# Patient Record
Sex: Female | Born: 1950 | Race: White | Hispanic: No | Marital: Married | Smoking: Never smoker
Health system: Southern US, Community
[De-identification: ages and names within clinical notes are randomized; demographics above are authoritative.]

## PROBLEM LIST (undated history)

## (undated) ENCOUNTER — Emergency Department (HOSPITAL_COMMUNITY): Admission: EM | Disposition: A | Discharge status: Other Acute Inpatient Hospital | Payer: Self-pay

## (undated) DIAGNOSIS — Z973 Presence of spectacles and contact lenses: Secondary | ICD-10-CM

## (undated) DIAGNOSIS — N6092 Unspecified benign mammary dysplasia of left breast: Secondary | ICD-10-CM

## (undated) DIAGNOSIS — K631 Perforation of intestine (nontraumatic): Secondary | ICD-10-CM

## (undated) DIAGNOSIS — N952 Postmenopausal atrophic vaginitis: Secondary | ICD-10-CM

## (undated) DIAGNOSIS — E041 Nontoxic single thyroid nodule: Secondary | ICD-10-CM

## (undated) DIAGNOSIS — N840 Polyp of corpus uteri: Secondary | ICD-10-CM

## (undated) DIAGNOSIS — M810 Age-related osteoporosis without current pathological fracture: Secondary | ICD-10-CM

## (undated) HISTORY — PX: COLONOSCOPY: SHX174

## (undated) HISTORY — PX: UPPER GI ENDOSCOPY: SHX6162

## (undated) HISTORY — DX: Unspecified benign mammary dysplasia of left breast: N60.92

## (undated) HISTORY — DX: Postmenopausal atrophic vaginitis: N95.2

## (undated) HISTORY — DX: Age-related osteoporosis without current pathological fracture: M81.0

## (undated) HISTORY — PX: DILATION AND CURETTAGE OF UTERUS: SHX78

## (undated) HISTORY — DX: Nontoxic single thyroid nodule: E04.1

---

## 2001-11-10 ENCOUNTER — Other Ambulatory Visit: Admission: RE | Admit: 2001-11-10 | Discharge: 2001-11-10 | Payer: Self-pay | Admitting: Family Medicine

## 2001-12-24 ENCOUNTER — Ambulatory Visit (HOSPITAL_COMMUNITY): Admission: RE | Admit: 2001-12-24 | Discharge: 2001-12-24 | Payer: Self-pay | Admitting: Gastroenterology

## 2003-01-05 ENCOUNTER — Other Ambulatory Visit: Admission: RE | Admit: 2003-01-05 | Discharge: 2003-01-05 | Payer: Self-pay | Admitting: Family Medicine

## 2003-01-19 ENCOUNTER — Emergency Department (HOSPITAL_COMMUNITY): Admission: EM | Admit: 2003-01-19 | Discharge: 2003-01-19 | Payer: Self-pay

## 2004-03-09 ENCOUNTER — Other Ambulatory Visit: Admission: RE | Admit: 2004-03-09 | Discharge: 2004-03-09 | Payer: Self-pay | Admitting: Family Medicine

## 2005-06-15 ENCOUNTER — Other Ambulatory Visit: Admission: RE | Admit: 2005-06-15 | Discharge: 2005-06-15 | Payer: Self-pay | Admitting: Family Medicine

## 2007-09-10 DIAGNOSIS — E041 Nontoxic single thyroid nodule: Secondary | ICD-10-CM

## 2007-09-10 HISTORY — DX: Nontoxic single thyroid nodule: E04.1

## 2007-09-29 ENCOUNTER — Encounter: Admission: RE | Admit: 2007-09-29 | Discharge: 2007-09-29 | Payer: Self-pay | Admitting: Family Medicine

## 2008-04-28 ENCOUNTER — Other Ambulatory Visit: Admission: RE | Admit: 2008-04-28 | Discharge: 2008-04-28 | Payer: Self-pay | Admitting: Obstetrics and Gynecology

## 2008-05-31 ENCOUNTER — Ambulatory Visit (HOSPITAL_COMMUNITY): Admission: RE | Admit: 2008-05-31 | Discharge: 2008-05-31 | Payer: Self-pay | Admitting: Obstetrics & Gynecology

## 2008-05-31 ENCOUNTER — Encounter: Payer: Self-pay | Admitting: Obstetrics & Gynecology

## 2008-05-31 HISTORY — PX: COMBINED HYSTEROSCOPY DIAGNOSTIC / D&C: SUR297

## 2009-09-26 ENCOUNTER — Encounter: Admission: RE | Admit: 2009-09-26 | Discharge: 2009-09-26 | Payer: Self-pay | Admitting: Obstetrics & Gynecology

## 2009-10-13 ENCOUNTER — Encounter: Admission: RE | Admit: 2009-10-13 | Discharge: 2009-10-13 | Payer: Self-pay | Admitting: Endocrinology

## 2009-10-13 ENCOUNTER — Other Ambulatory Visit: Admission: RE | Admit: 2009-10-13 | Discharge: 2009-10-13 | Payer: Self-pay | Admitting: Interventional Radiology

## 2010-10-24 NOTE — Op Note (Signed)
[name], Brianna Fuller             ACCOUNT NO.:  [id number]   MEDICAL RECORD NO.:  [id number]          PATIENT TYPE:  AMB   LOCATION:  SDC                           FACILITY:  [facility]   PHYSICIAN:  M. Leda Quail, MD  DATE OF BIRTH:  [birth year]   DATE OF PROCEDURE:  05/31/2008  DATE OF DISCHARGE:                               OPERATIVE REPORT   PREOPERATIVE DIAGNOSES:  38. A 60 year old G0 thin white female with postmenopausal bleeding.  2. Sonohysterogram in the office showed a 6-mm fundal polyp.  3. Several small uterine fibroids.   POSTOPERATIVE DIAGNOSIS:  Thin atrophic endometrium without any polyp.   PROCEDURES:  1. Hysteroscopy.  2. Dilation and curettage.   SURGEON:  M. Leda Quail, MD   ASSISTANT:  OR staff.   ANESTHESIA:  LMA.   FINDINGS:  Thin endometrium.  She has a small change in the contour of  the uterus.  It is consistent with a fibroid that abuts the level of the  endometrium, but it really has no submucous component.   SPECIMENS:  Endometrial curettings.   DISPOSITION:  Specimens sent to Pathology.   ESTIMATED BLOOD LOSS:  Minimal.   FLUIDS:  1000 mL of LR.   URINE OUTPUT:  100 mL of clear urine output.   FLUID DEFICIT:  A 3% sorbitol used for the procedure with a 55 mL fluid  deficit.   COMPLICATIONS:  None.   INDICATIONS:  [name] is a 60 year old G0 single white female who  has a history of postmenopausal bleeding.  She has been on Prempro for 3-  5 years.  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  She has had no bleeding  until that time with menopause at age 7.  She was evaluated with  ultrasound sonohysterogram that showed the uterus measuring 7.7 x 3.3 x  5.9 cm with a thin endometrium.  She had multiple fibroids.  Sonohysterogram showed a possible fundal polyp measuring 6 x 4 x 6 mm on  the left side of the fundus.  Her endometrial stripe was slightly  enhanced and was measuring 1.7 mm.  At this point because of the polyp,  I  recommended hysteroscopy with polyp removal.  Risks and benefits were  explained to the patient.  She did watch video in the office.  This was  all documented in her office chart.  All questions were answered.  She  was scheduled for surgery.   DESCRIPTION OF PROCEDURE:  The patient was taken to the operating room.  She was placed in supine position.  Anesthesia was administered by the  Anesthesia staff without difficulty.  Legs were positioned in a high  lithotomy position in [place] stirrups.  SCDs were in lower extremities  bilaterally.  A single dose of IV antibiotics have been given.  Perineum, inner thighs, and vagina were prepped and draped in normal  sterile fashion.  A red rubber Foley catheter was used to drain the  bladder of all urine.  A bivalve speculum was placed in the vagina.  The  anterior lip of the cervix was grasped with a single-tooth tenaculum.  A  paracervical block was placed in the cervix.  A 10 mL of 1% lidocaine  mixed 1:1 with epinephrine (1:100,000 units) was instilled in cervix.  The uterus sounded to about 7 cm, which was larger then the ultrasound  showed.  Using [facility] dilators, the cervix was dilated up to a #21.  Hysteroscope was then passed into the cervical canal to the uterine  fundus, but the hysteroscope was placed.  I could easily tell that the  dilators had pushed into the myometrium of the fundus.  However, there  was no perforation that was noted.  Tubal ostia were noted bilaterally.  The endometrium was quite thin.  There was no atypical fluid loss at  this time.  In the left fundus, there was some evidence of a small bulge  at the top of the fundus that could be consistent with the fibroid.  If  this was a fibroid, had very minimal submucous component and because of  the dilation in the myometrium, I did not feel resection at this point  was appropriate.  A #1 rough curette was obtained and endometrium was  curetted until a rough gritty texture  was noted.  There was scant tissue  specimen that was obtained.  This was sent to Pathology.   At this point, the procedure was ended.  All instruments were removed  from the cervix.  The tenaculum was removed.  There was scant bleeding  from the cervix.  A 30 mg IV of Toradol was given to the patient.  After  all her instruments were removed, the Betadine prep was cleaned off from  her skin.  Her legs were positioned back to supine position.  She was  awakened from anesthesia without difficulty and taken to the recovery  room in stable condition.  Sponge, lap, needle, and instrument counts  were correct x2 for the procedure.      Lum Keas, MD  Electronically Signed     MSM/MEDQ  D:  05/31/2008  T:  05/31/2008  Job:  [id number]

## 2010-10-27 NOTE — Procedures (Signed)
[place]  Patient:    Brianna Fuller, Brianna Fuller Visit Number: [id number] MRN: [id number]          Service Type: END Location: ENDO Attending Physician:  Charna Elizabeth Dictated by:   Anselmo Rod, M.D. Proc. Date: 12/24/01 Admit Date:  12/24/2001 Discharge Date: 12/24/2001   CC:         Talmadge Coventry, M.D.   Procedure Report  DATE OF BIRTH:  08-06-2050  PROCEDURE PERFORMED:  Colonoscopy.  ENDOSCOPIST:  Anselmo Rod, M.D.  INSTRUMENT USED:  Olympus video colonoscope (adjustable pediatric scope).  INDICATIONS FOR PROCEDURE:  A 60 year old white female undergoing screening colonoscopy.  The patient has a family history of breast cancer, and the patient was also found to have guaiac-positive stools in the recent past.  PREPROCEDURE PREPARATION:  Informed consent was procured from the patient. The patient was fasted for eight hours prior to the procedure and prepped with a bottle of magnesium citrate and a gallon of NuLYTELY the night prior to the procedure.  PREPROCEDURE PHYSICAL EXAMINATION:  VITAL SIGNS:  Stable.  NECK:  Supple.  CHEST:  Clear to auscultation.  HEART:  S1 and S2 regular.  ABDOMEN:  Soft with normal bowel sounds.  DESCRIPTION OF PROCEDURE:  The patient was placed in the left lateral decubitus position and sedated with 80 mg of Demerol and 8 mg of Versed intravenously.  Once the patient was adequately sedated and maintained on low flow oxygen and continuous cardiac monitoring, the Olympus video colonoscope was advanced from the rectum to the cecum with slight difficulty.  The patient had a very tortuous colon.  Small non-bleeding internal hemorrhoids were seen on retroflexion in the rectum.  No masses, polyps, erosions, ulcerations, or diverticula were present.  IMPRESSION: 1. Tortuous colon. 2. No masses, polyps, or diverticula seen. 3. Small non-bleeding internal hemorrhoids.  RECOMMENDATIONS: 1. A high  fiber diet has been recommended for the patient. 2. Repeat colorectal examination screening is recommended in the next five    years unless the patient develops any abnormal symptoms in the interim. 3. Repeat guaiac testing will be done on an outpatient basis, and further    recommendations will be made as needed. Dictated by:   Anselmo Rod, M.D. Attending Physician:  Charna Elizabeth DD:  12/24/01 TD:  12/29/01 Job: [id number]

## 2011-03-16 LAB — URINALYSIS, ROUTINE W REFLEX MICROSCOPIC
Bilirubin Urine: NEGATIVE
Glucose, UA: NEGATIVE mg/dL
Hgb urine dipstick: NEGATIVE
Ketones, ur: NEGATIVE mg/dL
pH: 6 (ref 5.0–8.0)

## 2011-03-16 LAB — CBC
HCT: 34.8 % — ABNORMAL LOW (ref 36.0–46.0)
Hemoglobin: 11.9 g/dL — ABNORMAL LOW (ref 12.0–15.0)
MCHC: 34.1 g/dL (ref 30.0–36.0)
MCV: 92.5 fL (ref 78.0–100.0)
RDW: 12.5 % (ref 11.5–15.5)

## 2011-09-22 ENCOUNTER — Encounter (HOSPITAL_COMMUNITY): Payer: Self-pay | Admitting: Family Medicine

## 2011-09-22 ENCOUNTER — Emergency Department (HOSPITAL_COMMUNITY)
Admission: EM | Admit: 2011-09-22 | Discharge: 2011-09-22 | Disposition: A | Discharge status: Home / Self Care | Payer: BC Managed Care – PPO | Attending: Emergency Medicine | Admitting: Emergency Medicine

## 2011-09-22 DIAGNOSIS — W5501XA Bitten by cat, initial encounter: Secondary | ICD-10-CM

## 2011-09-22 DIAGNOSIS — Y92009 Unspecified place in unspecified non-institutional (private) residence as the place of occurrence of the external cause: Secondary | ICD-10-CM | POA: Insufficient documentation

## 2011-09-22 DIAGNOSIS — IMO0001 Reserved for inherently not codable concepts without codable children: Secondary | ICD-10-CM | POA: Insufficient documentation

## 2011-09-22 DIAGNOSIS — S61509A Unspecified open wound of unspecified wrist, initial encounter: Secondary | ICD-10-CM | POA: Insufficient documentation

## 2011-09-22 DIAGNOSIS — Z880 Allergy status to penicillin: Secondary | ICD-10-CM | POA: Insufficient documentation

## 2011-09-22 MED ORDER — CLINDAMYCIN HCL 150 MG PO CAPS: 150.0000 mg | ORAL_CAPSULE | Freq: Four times a day (QID) | ORAL | Status: AC

## 2011-09-22 NOTE — ED Provider Notes (Signed)
History     CSN: [id number]  Arrival date & time 09/22/11  1733   First MD Initiated Contact with Patient 09/22/11 1844      Chief Complaint  Patient presents with  . Animal Bite    (Consider location/radiation/quality/duration/timing/severity/associated sxs/prior treatment) Patient is a 61 y.o. female presenting with animal bite. The history is provided by the patient.  Animal Bite  The incident occurred today. Pertinent negatives include no numbness and no weakness.   patient was bit by a cat earlier today. It is a neighbors cat that she is watching. She called the neighbor and the neighbor stated that he doesn't have a fat. The calves not had his shots. The cat is an indoor cat and does not been exposed to any wild animals. Tetanus is up-to-date. She still has a cat.  History reviewed. No pertinent past medical history.  History reviewed. No pertinent past surgical history.  History reviewed. No pertinent family history.  History  Substance Use Topics  . Smoking status: Never Smoker   . Smokeless tobacco: Not on file  . Alcohol Use:     OB History    Grav Para Term Preterm Abortions TAB SAB Ect Mult Living                  Review of Systems  Skin: Positive for wound. Negative for color change, pallor and rash.  Neurological: Negative for weakness and numbness.    Allergies  Amoxicillin and Other  Home Medications   Current Outpatient Rx  Name Route Sig Dispense Refill  . CALCIUM + D PO Oral Take 2 tablets by mouth.    Marland Kitchen CONJ ESTROG-MEDROXYPROGEST ACE 0.3-1.5 MG PO TABS Oral Take 1 tablet by mouth daily.    . OMEGA-3 FATTY ACIDS 1000 MG PO CAPS Oral Take 2 g by mouth daily.    Marland Kitchen LORATADINE 10 MG PO TABS Oral Take 10 mg by mouth daily. For allergies    . ADULT MULTIVITAMIN W/MINERALS CH Oral Take 2 tablets by mouth daily.    Marland Kitchen OVER THE COUNTER MEDICATION Oral Take 2 tablets by mouth daily.    Marland Kitchen CLINDAMYCIN HCL 150 MG PO CAPS Oral Take 1 capsule (150 mg total)  by mouth every 6 (six) hours. 20 capsule 0    BP 112/84  Pulse 70  Temp(Src) 98.1 F (36.7 C) (Oral)  Resp 20  SpO2 100%  Physical Exam  Constitutional: She appears well-developed and well-nourished.  Musculoskeletal: Normal range of motion.       2 small puncture wounds to dorsum of left wrist. No bleeding. Neurovascular intact distally.  Skin:       Puncture wounds to left wrist    ED Course  Procedures (including critical care time)  Labs Reviewed - No data to display No results found.   1. Cat bite       MDM  Cat bite to left wrist. Doubt intra-articular involvement. Patient has a Still. Tetanus is up-to-date. Antibiotics will be given. Health Department will need to follow        Juliet Rude. Rubin Payor, MD 09/22/11 1924

## 2011-09-22 NOTE — Discharge Instructions (Signed)
Animal Bite  An animal bite can result in a scratch on the skin, deep open cut, puncture of the skin, crush injury, or tearing away of the skin or a body part. Dogs are responsible for most animal bites. Children are bitten more often than adults. An animal bite can range from very mild to more serious. A small bite from your house pet is no cause for alarm. However, some animal bites can become infected or injure a bone or other tissue. You must seek medical care if:  · The skin is broken and bleeding does not slow down or stop after 15 minutes.  · The puncture is deep and difficult to clean (such as a cat bite).  · Pain, warmth, redness, or pus develops around the wound.  · The bite is from a stray animal or rodent. There may be a risk of rabies infection.  · The bite is from a snake, raccoon, skunk, fox, coyote, or bat. There may be a risk of rabies infection.  · The person bitten has a chronic illness such as diabetes, liver disease, or cancer, or the person takes medicine that lowers the immune system.  · There is concern about the location and severity of the bite.  It is important to clean and protect an animal bite wound right away to prevent infection. Follow these steps:  · Clean the wound with plenty of water and soap.  · Apply an antibiotic cream.  · Apply gentle pressure over the wound with a clean towel or gauze to slow or stop bleeding.  · Elevate the affected area above the heart to help stop any bleeding.  · Seek medical care. Getting medical care within 8 hours of the animal bite leads to the best possible outcome.  DIAGNOSIS   Your caregiver will most likely:  · Take a detailed history of the animal and the bite injury.  · Perform a wound exam.  · Take your medical history.  Blood tests or X-rays may be performed. Sometimes, infected bite wounds are cultured and sent to a lab to identify the infectious bacteria.   TREATMENT   Medical treatment will depend on the location and type of animal bite as  well as the patient's medical history. Treatment may include:  · Wound care, such as cleaning and flushing the wound with saline solution, bandaging, and elevating the affected area.  · Antibiotics.  · Tetanus immunization.  · Rabies immunization.  · Leaving the wound open to heal. This is often done with animal bites, due to the high risk of infection. However, in certain cases, wound closure with stitches, wound adhesive, skin adhesive strips, or staples may be used.   Infected bites that are left untreated may require intravenous (IV) antibiotics and surgical treatment in the hospital.  HOME CARE INSTRUCTIONS  · Follow your caregiver's instructions for wound care.  · Take all medicines as directed.  · If your caregiver prescribes antibiotics, take them as directed. Finish them even if you start to feel better.  · Follow up with your caregiver for further exams or immunizations as directed.  You may need a tetanus shot if:  · You cannot remember when you had your last tetanus shot.  · You have never had a tetanus shot.  · The injury broke your skin.  If you get a tetanus shot, your arm may swell, get red, and feel warm to the touch. This is common and not a problem. If you need a tetanus   shot and you choose not to have one, there is a rare chance of getting tetanus. Sickness from tetanus can be serious.  SEEK MEDICAL CARE IF:  · You notice warmth, redness, soreness, swelling, pus discharge, or a bad smell coming from the wound.  · You have a red line on the skin coming from the wound.  · You have a fever, chills, or a general ill feeling.  · You have nausea or vomiting.  · You have continued or worsening pain.  · You have trouble moving the injured part.  · You have other questions or concerns.  MAKE SURE YOU:  · Understand these instructions.  · Will watch your condition.  · Will get help right away if you are not doing well or get worse.  Document Released: 02/13/2011 Document Revised: 05/17/2011 Document  Reviewed: 02/13/2011  [employer]® Patient Information ©2012 [employer].

## 2011-09-22 NOTE — ED Notes (Signed)
Pt sts neighbors cat bit her when she was feeding them. Pt has teeth marks on hand. sts unsure if cat has had rabies vaccination but cats are indoor cats.

## 2012-12-09 ENCOUNTER — Encounter: Payer: Self-pay | Admitting: Certified Nurse Midwife

## 2012-12-16 ENCOUNTER — Encounter: Payer: Self-pay | Admitting: Certified Nurse Midwife

## 2012-12-16 ENCOUNTER — Ambulatory Visit (INDEPENDENT_AMBULATORY_CARE_PROVIDER_SITE_OTHER): Payer: BC Managed Care – PPO | Admitting: Certified Nurse Midwife

## 2012-12-16 VITALS — BP 94/60 | HR 64 | Resp 16 | Ht 64.0 in | Wt 130.0 lb

## 2012-12-16 DIAGNOSIS — N951 Menopausal and female climacteric states: Secondary | ICD-10-CM

## 2012-12-16 DIAGNOSIS — Z01419 Encounter for gynecological examination (general) (routine) without abnormal findings: Secondary | ICD-10-CM

## 2012-12-16 DIAGNOSIS — Z0000 Encounter for general adult medical examination without abnormal findings: Secondary | ICD-10-CM

## 2012-12-16 LAB — CBC
HCT: 38 % (ref 36.0–46.0)
Hemoglobin: 12.5 g/dL (ref 12.0–15.0)
RBC: 4.2 MIL/uL (ref 3.87–5.11)

## 2012-12-16 LAB — POCT URINALYSIS DIPSTICK
Blood, UA: NEGATIVE
Glucose, UA: NEGATIVE
Ketones, UA: NEGATIVE
Protein, UA: NEGATIVE
Urobilinogen, UA: NEGATIVE

## 2012-12-16 LAB — HEMOGLOBIN, FINGERSTICK: Hemoglobin, fingerstick: 13.1 g/dL (ref 12.0–16.0)

## 2012-12-16 MED ORDER — CONJ ESTROG-MEDROXYPROGEST ACE 0.3-1.5 MG PO TABS: 1.0000 | ORAL_TABLET | Freq: Every day | ORAL | Status: DC

## 2012-12-16 NOTE — Progress Notes (Signed)
62 y.o. G0P0000 Single Caucasian Fe here for annual exam.Menopausal on HRT desires continuance. Denies vaginal bleeding or vaginal dryness. Saw PCP this year with all labs, being normal.  No health issues.    Patient's last menstrual period was 06/12/2007.          Sexually active: no  The current method of family planning is post menopausal status.    Exercising: yes  gym, weights & yoga Smoker:  no  Health Maintenance: Pap: 12-12-11 neg HPV HR neg MMG: 12-14-11 normal Colonoscopy:  9/13 BMD:  12-14-11 TDaP: 2013 Labs: Poct urine-neg, Hgb- Self breast exam: not done   reports that she has never smoked. She does not have any smokeless tobacco history on file. She reports that she drinks about 2.0 ounces of alcohol per week. She reports that she does not use illicit drugs.  Past Medical History  Diagnosis Date  . Thyroid nodule 4/09    right  . Osteoporosis     Past Surgical History  Procedure Laterality Date  . Combined hysteroscopy diagnostic / d&c  12/09    Current Outpatient Prescriptions  Medication Sig Dispense Refill  . Ascorbic Acid (VITAMIN C PO) Take 500 mg by mouth as needed.      . Calcium Carbonate-Vitamin D (CALCIUM + D PO) Take 1 tablet by mouth daily.       . Cholecalciferol (VITAMIN D PO) Take 2,000 Int'l Units by mouth. 2 daily      . estrogen, conjugated,-medroxyprogesterone (PREMPRO) 0.3-1.5 MG per tablet Take 1 tablet by mouth daily.      Marland Kitchen loratadine (CLARITIN) 10 MG tablet Take 10 mg by mouth daily. For allergies       No current facility-administered medications for this visit.    Family History  Problem Relation Age of Onset  . Hypertension Father   . Cancer Brother     skin  . Osteoporosis Mother   . Hyperlipidemia Mother     ROS:  Pertinent items are noted in HPI.  Otherwise, a comprehensive ROS was negative.  Exam:   BP 94/60  Pulse 64  Resp 16  Ht 5\' 4"  (1.626 m)  Wt 130 lb (58.968 kg)  BMI 22.3 kg/m2  LMP 06/12/2007 Height: 5\' 4"   (162.6 cm)  Ht Readings from Last 3 Encounters:  12/16/12 5\' 4"  (1.626 m)    General appearance: alert, cooperative and appears stated age Head: Normocephalic, without obvious abnormality, atraumatic Neck: no adenopathy, supple, symmetrical, trachea midline and thyroid normal to inspection and palpation left breast and right lobe slight enlargement with nodule, no size change Lungs: clear to auscultation bilaterally Breasts: normal appearance, no masses or tenderness, No nipple retraction or dimpling, No nipple discharge or bleeding, No axillary or supraclavicular adenopathy, Taught monthly breast self examination Heart: regular rate and rhythm Abdomen: soft, non-tender; no masses,  no organomegaly Extremities: extremities normal, atraumatic, no cyanosis or edema Skin: Skin color, texture, turgor normal. No rashes or lesions Lymph nodes: Cervical, supraclavicular, and axillary nodes normal. No abnormal inguinal nodes palpated Neurologic: Grossly normal   Pelvic: External genitalia:  no lesions              Urethra:  normal appearing urethra with no masses, tenderness or lesions              Bartholin's and Skene's: normal                 Vagina: normal appearing vagina with normal color and discharge, no lesions  Cervix: normal, non tender              Pap taken: no Bimanual Exam:  Uterus:  anteflexed and nodular consistent with fibroid              Adnexa: normal adnexa               Rectovaginal: Confirms               Anus:  normal sphincter tone, no lesions  A:  Well Woman with normal exam  Menopausal on HRT desires continuance  Known thyroid nodule on right with - biopsy history no size change  Osteoporosis defers medication use, managing with exercise, diet and calcium/Vitamin D supplementation    P:   Reviewed health and wellness pertinent to exam  Discussed risks and benefits of HRT and patient/provider feel this is a good choice to continue HRT.  Rx Prempro  see order  Aware of importance to advise if vaginal bleeding  Continue follow up as indicated if change with PCor endocrine  Continue current plan of management, BMD due 2015.  Labs: CBC, Vitamin D   Pap smear as per guidelines   Mammogram yearly  Given SBE card for reminder pap smear not taken today  counseled on breast self exam, mammography screening, menopause, osteoporosis, adequate intake of calcium and vitamin D, diet and exercise  return annually or prn  An After Visit Summary was printed and given to the patient.  Reviewed, TL

## 2012-12-16 NOTE — Patient Instructions (Signed)

## 2012-12-17 LAB — VITAMIN D 25 HYDROXY (VIT D DEFICIENCY, FRACTURES): Vit D, 25-Hydroxy: 53 ng/mL (ref 30–89)

## 2012-12-18 ENCOUNTER — Telehealth: Payer: Self-pay

## 2012-12-18 NOTE — Telephone Encounter (Signed)
Message copied by Eliezer Bottom on Thu Dec 18, 2012 10:18 AM ------      Message from: Verner Chol      Created: Wed Dec 17, 2012  4:45 PM       Notify CBC normal      Vitamin D normal  Can take 1000IU daily to maintain ------

## 2012-12-18 NOTE — Telephone Encounter (Signed)
lmtcb

## 2012-12-19 NOTE — Telephone Encounter (Signed)
Patient notified of results.

## 2013-11-12 ENCOUNTER — Other Ambulatory Visit: Payer: Self-pay | Admitting: Family Medicine

## 2013-11-12 DIAGNOSIS — E041 Nontoxic single thyroid nodule: Secondary | ICD-10-CM

## 2013-11-17 ENCOUNTER — Ambulatory Visit
Admission: RE | Admit: 2013-11-17 | Discharge: 2013-11-17 | Disposition: A | Discharge status: Home / Self Care | Payer: BC Managed Care – PPO | Source: Ambulatory Visit | Attending: Family Medicine | Admitting: Family Medicine

## 2013-11-17 ENCOUNTER — Other Ambulatory Visit: Payer: BC Managed Care – PPO

## 2013-11-17 DIAGNOSIS — E041 Nontoxic single thyroid nodule: Secondary | ICD-10-CM

## 2013-12-17 ENCOUNTER — Ambulatory Visit (INDEPENDENT_AMBULATORY_CARE_PROVIDER_SITE_OTHER): Payer: BC Managed Care – PPO | Admitting: Certified Nurse Midwife

## 2013-12-17 ENCOUNTER — Encounter: Payer: Self-pay | Admitting: Certified Nurse Midwife

## 2013-12-17 VITALS — BP 116/68 | HR 68 | Ht 64.0 in | Wt 128.0 lb

## 2013-12-17 DIAGNOSIS — N951 Menopausal and female climacteric states: Secondary | ICD-10-CM

## 2013-12-17 DIAGNOSIS — Z124 Encounter for screening for malignant neoplasm of cervix: Secondary | ICD-10-CM

## 2013-12-17 DIAGNOSIS — Z01419 Encounter for gynecological examination (general) (routine) without abnormal findings: Secondary | ICD-10-CM

## 2013-12-17 DIAGNOSIS — Z0000 Encounter for general adult medical examination without abnormal findings: Secondary | ICD-10-CM

## 2013-12-17 MED ORDER — CONJ ESTROG-MEDROXYPROGEST ACE 0.3-1.5 MG PO TABS: 1.0000 | ORAL_TABLET | Freq: Every day | ORAL | Status: DC

## 2013-12-17 NOTE — Patient Instructions (Signed)
EXERCISE AND DIET:  We recommended that you start or continue a regular exercise program for good health. Regular exercise means any activity that makes your heart beat faster and makes you sweat.  We recommend exercising at least 30 minutes per day at least 3 days a week, preferably 4 or 5.  We also recommend a diet low in fat and sugar.  Inactivity, poor dietary choices and obesity can cause diabetes, heart attack, stroke, and kidney damage, among others.    ALCOHOL AND SMOKING:  Women should limit their alcohol intake to no more than 7 drinks/beers/glasses of wine (combined, not each!) per week. Moderation of alcohol intake to this level decreases your risk of breast cancer and liver damage. And of course, no recreational drugs are part of a healthy lifestyle.  And absolutely no smoking or even second hand smoke. Most people know smoking can cause heart and lung diseases, but did you know it also contributes to weakening of your bones? Aging of your skin?  Yellowing of your teeth and nails?  CALCIUM AND VITAMIN D:  Adequate intake of calcium and Vitamin D are recommended.  The recommendations for exact amounts of these supplements seem to change often, but generally speaking 600 mg of calcium (either carbonate or citrate) and 800 units of Vitamin D per day seems prudent. Certain women may benefit from higher intake of Vitamin D.  If you are among these women, your doctor will have told you during your visit.    PAP SMEARS:  Pap smears, to check for cervical cancer or precancers,  have traditionally been done yearly, although recent scientific advances have shown that most women can have pap smears less often.  However, every woman still should have a physical exam from her gynecologist every year. It will include a breast check, inspection of the vulva and vagina to check for abnormal growths or skin changes, a visual exam of the cervix, and then an exam to evaluate the size and shape of the uterus and  ovaries.  And after 63 years of age, a rectal exam is indicated to check for rectal cancers. We will also provide age appropriate advice regarding health maintenance, like when you should have certain vaccines, screening for sexually transmitted diseases, bone density testing, colonoscopy, mammograms, etc.   MAMMOGRAMS:  All women over 40 years old should have a yearly mammogram. Many facilities now offer a "3D" mammogram, which may cost around $50 extra out of pocket. If possible,  we recommend you accept the option to have the 3D mammogram performed.  It both reduces the number of women who will be called back for extra views which then turn out to be normal, and it is better than the routine mammogram at detecting truly abnormal areas.    COLONOSCOPY:  Colonoscopy to screen for colon cancer is recommended for all women at age 50.  We know, you hate the idea of the prep.  We agree, BUT, having colon cancer and not knowing it is worse!!  Colon cancer so often starts as a polyp that can be seen and removed at colonscopy, which can quite literally save your life!  And if your first colonoscopy is normal and you have no family history of colon cancer, most women don't have to have it again for 10 years.  Once every ten years, you can do something that may end up saving your life, right?  We will be happy to help you get it scheduled when you are ready.    Be sure to check your insurance coverage so you understand how much it will cost.  It may be covered as a preventative service at no cost, but you should check your particular policy.     Osteoporosis Throughout your life, your body breaks down old bone and replaces it with new bone. As you get older, your body does not replace bone as quickly as it breaks it down. By the age of 30 years, most people begin to gradually lose bone because of the imbalance between bone loss and replacement. Some people lose more bone than others. Bone loss beyond a specified  normal degree is considered osteoporosis.  Osteoporosis affects the strength and durability of your bones. The inside of the ends of your bones and your flat bones, like the bones of your pelvis, look like honeycomb, filled with tiny open spaces. As bone loss occurs, your bones become less dense. This means that the open spaces inside your bones become bigger and the walls between these spaces become thinner. This makes your bones weaker. Bones of a person with osteoporosis can become so weak that they can break (fracture) during minor accidents, such as a simple fall. CAUSES  The following factors have been associated with the development of osteoporosis:  Smoking.  Drinking more than 2 alcoholic drinks several days per week.  Long-term use of certain medicines:  Corticosteroids.  Chemotherapy medicines.  Thyroid medicines.  Antiepileptic medicines.  Gonadal hormone suppression medicine.  Immunosuppression medicine.  Being underweight.  Lack of physical activity.  Lack of exposure to the sun. This can lead to vitamin D deficiency.  Certain medical conditions:  Certain inflammatory bowel diseases, such as Crohn disease and ulcerative colitis.  Diabetes.  Hyperthyroidism.  Hyperparathyroidism. RISK FACTORS Anyone can develop osteoporosis. However, the following factors can increase your risk of developing osteoporosis:  Gender--Women are at higher risk than men.  Age--Being older than 50 years increases your risk.  Ethnicity--White and Asian people have an increased risk.  Weight --Being extremely underweight can increase your risk of osteoporosis.  Family history of osteoporosis--Having a family member who has developed osteoporosis can increase your risk. SYMPTOMS  Usually, people with osteoporosis have no symptoms.  DIAGNOSIS  Signs during a physical exam that may prompt your caregiver to suspect osteoporosis include:  Decreased height. This is usually caused  by the compression of the bones that form your spine (vertebrae) because they have weakened and become fractured.  A curving or rounding of the upper back (kyphosis). To confirm signs of osteoporosis, your caregiver may request a procedure that uses 2 low-dose X-ray beams with different levels of energy to measure your bone mineral density (dual-energy X-ray absorptiometry [DXA]). Also, your caregiver may check your level of vitamin D. TREATMENT  The goal of osteoporosis treatment is to strengthen bones in order to decrease the risk of bone fractures. There are different types of medicines available to help achieve this goal. Some of these medicines work by slowing the processes of bone loss. Some medicines work by increasing bone density. Treatment also involves making sure that your levels of calcium and vitamin D are adequate. PREVENTION  There are things you can do to help prevent osteoporosis. Adequate intake of calcium and vitamin D can help you achieve optimal bone mineral density. Regular exercise can also help, especially resistance and weight-bearing activities. If you smoke, quitting smoking is an important part of osteoporosis prevention. MAKE SURE YOU:  Understand these instructions.  Will watch your condition.    Will get help right away if you are not doing well or get worse. FOR MORE INFORMATION www.osteo.org and www.nof.org Document Released: 03/07/2005 Document Revised: 09/22/2012 Document Reviewed: 05/12/2011 [employer] Patient Information 2015 [employer]. This information is not intended to replace advice given to you by your health care provider. Make sure you discuss any questions you have with your health care provider.  

## 2013-12-17 NOTE — Progress Notes (Signed)
Patient ID: Brianna Fuller, female   DOB: 09/17/1950, 63 y.o.   MRN: [id number]  63 y.o. G0P0000 Single Caucasian Fe here for annual exam. Menopausal on HRT. Denies vaginal bleeding or vaginal dryness. Sees PCP for aex/ labs.Patient has very busy with work. Patient denies any health issues today.   Patient's last menstrual period was 06/12/2007.          Sexually active: no  The current method of family planning is post menopausal status.  Exercising: yes gym, weights, treadmill and personal trainer 2-3 days per week Smoker: no   Health Maintenance:  Pap: 12-12-11 neg HPV HR neg  MMG: 04/24/13, benign  Colonoscopy: 9/13  BMD: 12-14-11. Osteoporosis TDaP: 11/2011  Labs: HB: PCP  Urine:  Negative  Self breast exam: monthly    reports that she has never smoked. She does not have any smokeless tobacco history on file. She reports that she drinks about 2 - 2.5 ounces of alcohol per week. She reports that she does not use illicit drugs.  Past Medical History  Diagnosis Date  . Thyroid nodule 4/09    right  . Osteoporosis     Past Surgical History  Procedure Laterality Date  . Combined hysteroscopy diagnostic / d&c  12/09    Current Outpatient Prescriptions  Medication Sig Dispense Refill  . Ascorbic Acid (VITAMIN C PO) Take 500 mg by mouth as needed.      . Calcium Carbonate-Vitamin D (CALCIUM + D PO) Take 1 tablet by mouth daily.       . Cholecalciferol (VITAMIN D PO) Take 2,000 Int'l Units by mouth. 2 daily      . estrogen, conjugated,-medroxyprogesterone (PREMPRO) 0.3-1.5 MG per tablet Take 1 tablet by mouth daily.  1 tablet  12  . loratadine (CLARITIN) 10 MG tablet Take 10 mg by mouth daily. For allergies       No current facility-administered medications for this visit.    Family History  Problem Relation Age of Onset  . Hypertension Father   . Cancer Brother     skin  . Osteoporosis Mother   . Hyperlipidemia Mother     ROS:  Pertinent items are noted in HPI.   Otherwise, a comprehensive ROS was negative.  Exam:   LMP 06/12/2007    Ht Readings from Last 3 Encounters:  12/16/12 5\' 4"  (1.626 m)    General appearance: alert, cooperative and appears stated age Head: Normocephalic, without obvious abnormality, atraumatic Neck: no adenopathy, supple, symmetrical, trachea midline and thyroid normal to inspection and palpation and non-palpable Lungs: clear to auscultation bilaterally Breasts: normal appearance, no masses or tenderness, No nipple retraction or dimpling, No nipple discharge or bleeding, No axillary or supraclavicular adenopathy Heart: regular rate and rhythm Abdomen: soft, non-tender; no masses,  no organomegaly Extremities: extremities normal, atraumatic, no cyanosis or edema Skin: Skin color, texture, turgor normal. No rashes or lesions Lymph nodes: Cervical, supraclavicular, and axillary nodes normal. No abnormal inguinal nodes palpated Neurologic: Grossly normal   Pelvic: External genitalia:  no lesions              Urethra:  normal appearing urethra with no masses, tenderness or lesions              Bartholin's and Skene's: normal                 Vagina: normal appearing vagina with normal color and discharge, no lesions  Cervix: normal,nontender              Pap taken: Yes.   Bimanual Exam:  Uterus:  normal size, contour, position, consistency, mobility, non-tender and anteverted              Adnexa: normal adnexa and no mass, fullness, tenderness               Rectovaginal: Confirms               Anus:  normal sphincter tone, no lesions  A:  Well Woman with normal exam  Menopausal on HRT desires continuance  Osteoporosis manages with HRT, exercise, Vitamin D and calcium, declines any medications    P:   Reviewed health and wellness pertinent to exam  Discussed risks/benefits/WHI information on continued HRT after age of 5 and cardiovascular changes. Patient has osteoporosis and no other health issues.  Parents alive and well in their 32's. Provider and patient feel benefits out weigh risks at this time. Will re evaluate each year or if health changes.  Rx Prempro see order  BMD due this year, patient will schedule  Pap smear taken today with reflex   counseled on breast self exam, mammography screening, use and side effects of HRT, adequate intake of calcium and vitamin D return annually or prn  An After Visit Summary was printed and given to the patient.

## 2013-12-18 ENCOUNTER — Telehealth: Payer: Self-pay | Admitting: Certified Nurse Midwife

## 2013-12-18 LAB — IPS PAP TEST WITH REFLEX TO HPV

## 2013-12-18 NOTE — Telephone Encounter (Signed)
Lmtcb//kn 

## 2013-12-18 NOTE — Telephone Encounter (Signed)
11/11/13 Vit D result  29.1   Patient thinks she needs 2000iu due this result.  BMD is scheduled 12/21/2013 at [facility].

## 2013-12-18 NOTE — Progress Notes (Signed)
Reviewed personally.  M. Suzanne Daris Harkins, MD.  

## 2013-12-18 NOTE — Telephone Encounter (Signed)
Spoke with patient-states her PCP, Dr Leonides Schanz, checked her vitamin d level-results are 29.1. She recommended that the patient increase her vitamin d 1000iu to 2000iu daily and she wanted to make sure this is what DL would recommend. Advised with this level we usually suggest 600-800, so 1000 should be plenty, but she is also having her BMD done on 7/13 and aware we can wait for these results to come in and see if she may need to increase this. States Dr Leonides Schanz did not mention rechecking this level after increasing the dose. Also, was asking questions about low iodine levels and if she should have this checked. States she has had some stress issues and very tired, but also has been really busy at work and out in the heat. Please advise. Patient aware that I will call her on Monday.//kn

## 2013-12-20 NOTE — Telephone Encounter (Signed)
Let's wait on BMD to advise regarding Vitamin D. As for fatigue is she eating good amount of protein and sleeping with out problems? If not increase protein in diet.

## 2013-12-21 NOTE — Telephone Encounter (Signed)
Return call to patient, LMTCB. Can speak to kelly, tracy or sally.

## 2013-12-21 NOTE — Telephone Encounter (Signed)
Patient calling back, given message from Debbi. She states she will take one VIT D tablet until she hears back from BMD results.  She has this done today at [facility]. Thinks she may not be eating enough protein and will try to do better with this. Discussed that there is a lot of summer viral colds and GI symptoms going around. She will try to increase rest as well and wait to hear from Debbi with BMD results for further instruction.  Encounter closed.

## 2013-12-31 ENCOUNTER — Telehealth: Payer: Self-pay | Admitting: Certified Nurse Midwife

## 2013-12-31 NOTE — Telephone Encounter (Signed)
Patient calling asking for results from bone density

## 2013-12-31 NOTE — Telephone Encounter (Addendum)
Called patient, advised that I have looked in office and results are not available at this time. Advised they are mailed to our office so may take additional time to obtain results. Advised patient that she would be contacted when we have results. Patient agreeable to update.   Called [facility] and requested additional copy be faxed to office.

## 2014-01-06 ENCOUNTER — Telehealth: Payer: Self-pay | Admitting: Certified Nurse Midwife

## 2014-01-06 NOTE — Telephone Encounter (Signed)
Spoke with patient. Advised patient that we have received results from [facility] but that Regina Eck CNM has not yet had a chance to review. Advised as soon as Regina Eck CNM has a chance to review someone will be in contact with her with results and further instructions. Patient is agreeable. Advised might be later on tomorrow before Regina Eck CNM has a chance to review. Patient agreeable.

## 2014-01-06 NOTE — Telephone Encounter (Signed)
Pt calling to find out if ms Jackelyn Poling has gotten her bone density results.

## 2014-01-07 NOTE — Telephone Encounter (Signed)
I have reviewed the BMD and feel patient needs consult with MD regarding current status and options for management, due to osteoporosis. Please schedule.

## 2014-01-08 NOTE — Telephone Encounter (Signed)
Spoke with patient. Advised of message as seen below from [place]. Patient is agreeable and verbalizing understanding. Patient requesting to see Dr.Miller as she has seen her in the past. Patient will be out of town next week.Appointment scheduled for August 11th at 3pm. Patient agreeable to date and time.   Cc: Dr.Miller  Routing to provider for final review. Patient agreeable to disposition. Will close encounter

## 2014-01-09 DIAGNOSIS — K631 Perforation of intestine (nontraumatic): Secondary | ICD-10-CM

## 2014-01-09 HISTORY — DX: Perforation of intestine (nontraumatic): K63.1

## 2014-01-19 ENCOUNTER — Encounter: Payer: Self-pay | Admitting: Obstetrics & Gynecology

## 2014-01-19 ENCOUNTER — Ambulatory Visit (INDEPENDENT_AMBULATORY_CARE_PROVIDER_SITE_OTHER): Payer: BC Managed Care – PPO | Admitting: Obstetrics & Gynecology

## 2014-01-19 VITALS — BP 90/58 | HR 72 | Resp 18 | Ht 64.0 in | Wt 129.0 lb

## 2014-01-19 DIAGNOSIS — M810 Age-related osteoporosis without current pathological fracture: Secondary | ICD-10-CM

## 2014-01-29 ENCOUNTER — Encounter (HOSPITAL_COMMUNITY): Payer: Self-pay | Admitting: Emergency Medicine

## 2014-01-29 ENCOUNTER — Emergency Department (HOSPITAL_COMMUNITY): Payer: BC Managed Care – PPO

## 2014-01-29 ENCOUNTER — Emergency Department (INDEPENDENT_AMBULATORY_CARE_PROVIDER_SITE_OTHER)
Admission: EM | Admit: 2014-01-29 | Discharge: 2014-01-29 | Disposition: A | Discharge status: Nursing Home (Includes ICF) | Payer: BC Managed Care – PPO | Source: Home / Self Care | Attending: Family Medicine | Admitting: Family Medicine

## 2014-01-29 ENCOUNTER — Inpatient Hospital Stay (HOSPITAL_COMMUNITY)
Admission: EM | Admit: 2014-01-29 | Discharge: 2014-02-16 | DRG: 380 | Disposition: A | Discharge status: Home Health | Payer: BC Managed Care – PPO | Attending: General Surgery | Admitting: General Surgery

## 2014-01-29 DIAGNOSIS — R1084 Generalized abdominal pain: Secondary | ICD-10-CM

## 2014-01-29 DIAGNOSIS — Z881 Allergy status to other antibiotic agents status: Secondary | ICD-10-CM

## 2014-01-29 DIAGNOSIS — Z79899 Other long term (current) drug therapy: Secondary | ICD-10-CM

## 2014-01-29 DIAGNOSIS — K631 Perforation of intestine (nontraumatic): Secondary | ICD-10-CM | POA: Diagnosis present

## 2014-01-29 DIAGNOSIS — K265 Chronic or unspecified duodenal ulcer with perforation: Secondary | ICD-10-CM | POA: Diagnosis not present

## 2014-01-29 DIAGNOSIS — Z880 Allergy status to penicillin: Secondary | ICD-10-CM

## 2014-01-29 DIAGNOSIS — K319 Disease of stomach and duodenum, unspecified: Secondary | ICD-10-CM | POA: Diagnosis not present

## 2014-01-29 DIAGNOSIS — E041 Nontoxic single thyroid nodule: Secondary | ICD-10-CM | POA: Diagnosis present

## 2014-01-29 DIAGNOSIS — R1111 Vomiting without nausea: Secondary | ICD-10-CM

## 2014-01-29 DIAGNOSIS — R1110 Vomiting, unspecified: Secondary | ICD-10-CM

## 2014-01-29 DIAGNOSIS — K651 Peritoneal abscess: Secondary | ICD-10-CM | POA: Diagnosis not present

## 2014-01-29 DIAGNOSIS — F411 Generalized anxiety disorder: Secondary | ICD-10-CM | POA: Diagnosis present

## 2014-01-29 DIAGNOSIS — K6819 Other retroperitoneal abscess: Secondary | ICD-10-CM | POA: Diagnosis present

## 2014-01-29 DIAGNOSIS — M810 Age-related osteoporosis without current pathological fracture: Secondary | ICD-10-CM | POA: Diagnosis present

## 2014-01-29 DIAGNOSIS — E8779 Other fluid overload: Secondary | ICD-10-CM | POA: Diagnosis not present

## 2014-01-29 DIAGNOSIS — I10 Essential (primary) hypertension: Secondary | ICD-10-CM | POA: Diagnosis present

## 2014-01-29 DIAGNOSIS — E876 Hypokalemia: Secondary | ICD-10-CM | POA: Diagnosis present

## 2014-01-29 LAB — COMPREHENSIVE METABOLIC PANEL
ALT: 17 U/L (ref 0–35)
AST: 24 U/L (ref 0–37)
Albumin: 3.9 g/dL (ref 3.5–5.2)
Alkaline Phosphatase: 65 U/L (ref 39–117)
Anion gap: 16 — ABNORMAL HIGH (ref 5–15)
BUN: 16 mg/dL (ref 6–23)
CALCIUM: 9.4 mg/dL (ref 8.4–10.5)
CO2: 21 mEq/L (ref 19–32)
Chloride: 102 mEq/L (ref 96–112)
Creatinine, Ser: 0.58 mg/dL (ref 0.50–1.10)
GFR calc non Af Amer: >90 mL/min (ref >90)
Glucose, Bld: 135 mg/dL — ABNORMAL HIGH (ref 70–99)
Potassium: 3.5 mEq/L — ABNORMAL LOW (ref 3.7–5.3)
SODIUM: 139 meq/L (ref 137–147)
TOTAL PROTEIN: 6.9 g/dL (ref 6.0–8.3)
Total Bilirubin: 0.7 mg/dL (ref 0.3–1.2)

## 2014-01-29 LAB — CBC WITH DIFFERENTIAL/PLATELET
BASOS ABS: 0 10*3/uL (ref 0.0–0.1)
Basophils Relative: 0 % (ref 0–1)
EOS ABS: 0 10*3/uL (ref 0.0–0.7)
EOS PCT: 0 % (ref 0–5)
HCT: 35.4 % — ABNORMAL LOW (ref 36.0–46.0)
Hemoglobin: 12.1 g/dL (ref 12.0–15.0)
Lymphocytes Relative: 2 % — ABNORMAL LOW (ref 12–46)
Lymphs Abs: 0.2 10*3/uL — ABNORMAL LOW (ref 0.7–4.0)
MCH: 30.6 pg (ref 26.0–34.0)
MCHC: 34.2 g/dL (ref 30.0–36.0)
MCV: 89.6 fL (ref 78.0–100.0)
Monocytes Absolute: 0.6 10*3/uL (ref 0.1–1.0)
Monocytes Relative: 4 % (ref 3–12)
Neutro Abs: 12.6 10*3/uL — ABNORMAL HIGH (ref 1.7–7.7)
Neutrophils Relative %: 94 % — ABNORMAL HIGH (ref 43–77)
PLATELETS: 200 10*3/uL (ref 150–400)
RBC: 3.95 MIL/uL (ref 3.87–5.11)
RDW: 12.6 % (ref 11.5–15.5)
WBC: 13.4 10*3/uL — AB (ref 4.0–10.5)

## 2014-01-29 LAB — I-STAT CG4 LACTIC ACID, ED: Lactic Acid, Venous: 2.28 mmol/L — ABNORMAL HIGH (ref 0.5–2.2)

## 2014-01-29 LAB — LIPASE, BLOOD: LIPASE: 21 U/L (ref 11–59)

## 2014-01-29 LAB — I-STAT TROPONIN, ED: TROPONIN I, POC: 0.08 ng/mL (ref 0.00–0.08)

## 2014-01-29 MED ORDER — IOHEXOL 300 MG/ML  SOLN
25.0000 mL | Freq: Once | INTRAMUSCULAR | Status: AC | PRN
  Administered 2014-01-29: 25 mL via ORAL

## 2014-01-29 MED ORDER — MORPHINE SULFATE 4 MG/ML IJ SOLN
4.0000 mg | Freq: Once | INTRAMUSCULAR | Status: AC | PRN
  Filled 2014-01-29: qty 1

## 2014-01-29 MED ORDER — ACETAMINOPHEN 325 MG PO TABS: 650.0000 mg | ORAL_TABLET | Freq: Four times a day (QID) | ORAL | Status: DC | PRN

## 2014-01-29 MED ORDER — ONDANSETRON HCL 4 MG/2ML IJ SOLN
4.0000 mg | Freq: Once | INTRAMUSCULAR | Status: AC
  Administered 2014-01-30: 4 mg via INTRAVENOUS
  Filled 2014-01-29 (×2): qty 2

## 2014-01-29 MED ORDER — MORPHINE SULFATE 4 MG/ML IJ SOLN: 4.0000 mg | Freq: Once | INTRAMUSCULAR | Status: DC

## 2014-01-29 MED ORDER — SODIUM CHLORIDE 0.9 % IV BOLUS (SEPSIS)
1000.0000 mL | Freq: Once | INTRAVENOUS | Status: AC
  Administered 2014-01-29: 1000 mL via INTRAVENOUS

## 2014-01-29 NOTE — ED Notes (Signed)
Presents with right lower quadrant pain began this am associated with nausea, vomiting indigestion, pain with deep inspiration in chest and pain in epigastric area. Endorses fatigue for 2 months. Pt is febrile 102.4, alert, oriented. Took advil this am with no relief. Denies denies diarrhea. Right side tender. Last BM this AM.  Pain is described as sharp and "takes my breath away"

## 2014-01-29 NOTE — ED Provider Notes (Signed)
CSN: [id number]     Arrival date & time 01/29/14  1936 History   First MD Initiated Contact with Patient 01/29/14 2059     Chief Complaint  Patient presents with  . Constipation   (Consider location/radiation/quality/duration/timing/severity/associated sxs/prior Treatment) HPI Comments: Patient is a 63 yo caucasian female who presents with a 10/10 right upper abdominal pain. Onset 2 days. Worsening. Vomiting today. No prior constipation, though noted yesterday. Complains of chills, subjective fever. No prior history of abdominal complications. No prior surgery. Colonoscopies have been "clear". No urinary symptoms. Does not appear related to post prandial. Non-drinker.   Patient is a 63 y.o. female presenting with constipation. The history is provided by the patient.  Constipation   Past Medical History  Diagnosis Date  . Thyroid nodule 4/09    right  . Osteoporosis    Past Surgical History  Procedure Laterality Date  . Combined hysteroscopy diagnostic / d&c  12/09   Family History  Problem Relation Age of Onset  . Hypertension Father   . Cancer Brother     skin  . Osteoporosis Mother   . Hyperlipidemia Mother    History  Substance Use Topics  . Smoking status: Never Smoker   . Smokeless tobacco: Never Used  . Alcohol Use: 2.0 - 2.5 oz/week    4-5 drink(s) per week   OB History   Grav Para Term Preterm Abortions TAB SAB Ect Mult Living   0 0 0 0 0 0 0 0 0 0      Review of Systems  Gastrointestinal: Positive for constipation.  All other systems reviewed and are negative.   Allergies  Penicillins; Amoxicillin; and Other  Home Medications   Prior to Admission medications   Medication Sig Start Date End Date Taking? Authorizing Provider  Ascorbic Acid (VITAMIN C PO) Take 500 mg by mouth as needed.    Historical Provider, MD  Calcium Carbonate-Vitamin D (CALCIUM + D PO) Take 500 mg by mouth daily.     Historical Provider, MD  Cholecalciferol (VITAMIN D PO) Take  1,000 Int'l Units by mouth daily.     Historical Provider, MD  estrogen, conjugated,-medroxyprogesterone (PREMPRO) 0.3-1.5 MG per tablet Take 1 tablet by mouth daily. 12/17/13   Regina Eck, CNM  loratadine (CLARITIN) 10 MG tablet Take 10 mg by mouth daily. For allergies    Historical Provider, MD   BP 106/51  Pulse 98  Temp(Src) 97.7 F (36.5 C) (Oral)  Resp 16  SpO2 100%  LMP 06/12/2007 Physical Exam  Nursing note and vitals reviewed. Constitutional: She is oriented to person, place, and time. She appears well-developed and well-nourished. She appears distressed.  Appears in mild distress, mildly clammy  Pulmonary/Chest: Effort normal.  Abdominal: Soft. She exhibits no distension. There is tenderness. There is guarding. There is no rebound.  Bowel sounds mildly active. Soft. Tender to palpation mid epigastric and +Murphys sign. Lower quadrant and pelvic region non-tender. Supra-pubic non-tender  Neurological: She is alert and oriented to person, place, and time.  Skin: Skin is warm. No rash noted. No erythema.  Psychiatric: Her behavior is normal.    ED Course  Procedures (including critical care time) Labs Review Labs Reviewed - No data to display  Imaging Review No results found.   MDM   1. Generalized abdominal pain   2. Vomiting without nausea, vomiting of unspecified type    10/10, vomiting and mildly diaphoretic. To ER for further evaluation. Possible cholecystitis.     Bjorn Pippin,  PA-C 01/29/14 2140

## 2014-01-29 NOTE — ED Notes (Signed)
Patient c/o abdominal pain and epigastric pain onset this morning. Patient reports she has not been having normal bowel movements. Yesterday stool was hard and small. This morning stool was very small. Patient has not noticed any blood in stool. Patient is alert and oriented and in no acute distress.

## 2014-01-29 NOTE — ED Provider Notes (Signed)
CSN: [id number]     Arrival date & time 01/29/14  2146 History   First MD Initiated Contact with Patient 01/29/14 2246     Chief Complaint  Patient presents with  . Abdominal Pain  . Fever     (Consider location/radiation/quality/duration/timing/severity/associated sxs/prior Treatment) HPI Comments: Patient is a 63 year old female with a past medical history of osteoporosis and thyroid nodule who presents with abdominal pain that started this morning. The pain is located in her right lower abdomen and radiates to her upper abdomen and around to her right back. The pain is described as aching and moderate to severe. The pain started gradually and progressively worsened since the onset. Deep inspiration makes the pain worse.No alleviating factors. The patient has tried advil for symptoms without relief. Associated symptoms include nausea, vomiting, decreased appetite and subjective fever. Patient denies headache, diarrhea, chest pain, SOB, dysuria, constipation, abnormal vaginal bleeding/discharge. Patient denies any abdominal surgery besides hysterectomy in 2009.      Past Medical History  Diagnosis Date  . Thyroid nodule 4/09    right  . Osteoporosis    Past Surgical History  Procedure Laterality Date  . Combined hysteroscopy diagnostic / d&c  12/09   Family History  Problem Relation Age of Onset  . Hypertension Father   . Cancer Brother     skin  . Osteoporosis Mother   . Hyperlipidemia Mother    History  Substance Use Topics  . Smoking status: Never Smoker   . Smokeless tobacco: Never Used  . Alcohol Use: 2.0 - 2.5 oz/week    4-5 drink(s) per week   OB History   Grav Para Term Preterm Abortions TAB SAB Ect Mult Living   0 0 0 0 0 0 0 0 0 0      Review of Systems  Constitutional: Positive for appetite change. Negative for fever, chills and fatigue.  HENT: Negative for trouble swallowing.   Eyes: Negative for visual disturbance.  Respiratory: Negative for shortness of  breath.   Cardiovascular: Negative for chest pain and palpitations.  Gastrointestinal: Positive for nausea and abdominal pain. Negative for vomiting and diarrhea.  Genitourinary: Negative for dysuria and difficulty urinating.  Musculoskeletal: Positive for back pain. Negative for arthralgias and neck pain.  Skin: Negative for color change.  Neurological: Negative for dizziness and weakness.  Psychiatric/Behavioral: Negative for dysphoric mood.      Allergies  Penicillins; Amoxicillin; and Other  Home Medications   Prior to Admission medications   Medication Sig Start Date End Date Taking? Authorizing Provider  Ascorbic Acid (VITAMIN C PO) Take 500 mg by mouth as needed.    Historical Provider, MD  Calcium Carbonate-Vitamin D (CALCIUM + D PO) Take 500 mg by mouth daily.     Historical Provider, MD  Cholecalciferol (VITAMIN D PO) Take 1,000 Int'l Units by mouth daily.     Historical Provider, MD  estrogen, conjugated,-medroxyprogesterone (PREMPRO) 0.3-1.5 MG per tablet Take 1 tablet by mouth daily. 12/17/13   Regina Eck, CNM  loratadine (CLARITIN) 10 MG tablet Take 10 mg by mouth daily. For allergies    Historical Provider, MD   BP 115/57  Pulse 69  Temp(Src) 102.6 F (39.2 C) (Oral)  Resp 22  SpO2 99%  LMP 06/12/2007 Physical Exam  Nursing note and vitals reviewed. Constitutional: She is oriented to person, place, and time. She appears well-developed and well-nourished. No distress.  HENT:  Head: Normocephalic and atraumatic.  Eyes: Conjunctivae and EOM are normal.  Neck:  Normal range of motion.  Cardiovascular: Normal rate and regular rhythm.  Exam reveals no gallop and no friction rub.   No murmur heard. Pulmonary/Chest: Effort normal and breath sounds normal. She has no wheezes. She has no rales. She exhibits no tenderness.  Abdominal: Soft. She exhibits no distension. There is tenderness. There is no rebound and no guarding.  RLQ tenderness to palpation at  McBurney's point. No other focal tenderness to palpation or peritoneal signs.   Genitourinary:  No CVA tenderness.   Musculoskeletal: Normal range of motion.  Neurological: She is alert and oriented to person, place, and time. Coordination normal.  Speech is goal-oriented. Moves limbs without ataxia.   Skin: Skin is warm and dry.  Psychiatric: She has a normal mood and affect. Her behavior is normal.    ED Course  Procedures (including critical care time) Labs Review Labs Reviewed  CBC WITH DIFFERENTIAL - Abnormal; Notable for the following:    WBC 13.4 (*)    HCT 35.4 (*)    Neutrophils Relative % 94 (*)    Neutro Abs 12.6 (*)    Lymphocytes Relative 2 (*)    Lymphs Abs 0.2 (*)    All other components within normal limits  I-STAT CG4 LACTIC ACID, ED - Abnormal; Notable for the following:    Lactic Acid, Venous 2.28 (*)    All other components within normal limits  COMPREHENSIVE METABOLIC PANEL  LIPASE, BLOOD  URINALYSIS, ROUTINE W REFLEX MICROSCOPIC  I-STAT TROPOININ, ED   CRITICAL CARE Performed by: Alvina Chou   Total critical care time: 45 minutes  Critical care time was exclusive of separately billable procedures and treating other patients.  Critical care was necessary to treat or prevent imminent or life-threatening deterioration.  Critical care was time spent personally by me on the following activities: development of treatment plan with patient and/or surrogate as well as nursing, discussions with consultants, evaluation of patient's response to treatment, examination of patient, obtaining history from patient or surrogate, ordering and performing treatments and interventions, ordering and review of laboratory studies, ordering and review of radiographic studies, pulse oximetry and re-evaluation of patient's condition.   Imaging Review Dg Chest 2 View  01/30/2014   CLINICAL DATA:  Chest pain  EXAM: CHEST  2 VIEW  COMPARISON:  None available.  FINDINGS: The  cardiac and mediastinal silhouettes are within normal limits.  The lungs are normally inflated. Bibasilar linear opacities are most consistent with atelectasis, left greater than right. No airspace consolidation, pleural effusion, or pulmonary edema is identified. There is no pneumothorax.  No acute osseous abnormality identified.  IMPRESSION: Mild bibasilar atelectasis. No other acute cardiopulmonary abnormality.   Electronically Signed   By: Jeannine Boga M.D.   On: 01/30/2014 00:37   Ct Abdomen Pelvis W Contrast  01/30/2014   CLINICAL DATA:  Right abdominal pain, fever.  EXAM: CT ABDOMEN AND PELVIS WITH CONTRAST  TECHNIQUE: Multidetector CT imaging of the abdomen and pelvis was performed using the standard protocol following bolus administration of intravenous contrast.  CONTRAST:  159mL OMNIPAQUE IOHEXOL 300 MG/ML  SOLN  COMPARISON:  None.  FINDINGS: Patchy bibasilar opacities present within the partially visualized lungs are most consistent with atelectasis.  Liver demonstrates a normal contrast enhanced appearance. Prominent periportal edema noted.  Gallbladder within normal limits. No biliary dilatation. The spleen, adrenal glands, and pancreas demonstrate a normal contrast enhanced appearance.  Kidneys are equal in size with symmetric enhancement. No nephrolithiasis, hydronephrosis, or focal enhancing renal mass. Subcentimeter  hypodensity within the mid left kidney is too small the characterize, but statistically likely represents a small cyst.  Stomach within normal limits. There is extensive wall thickening with inflammatory stranding and free fluid about the second-third portion of the duodenum in the right upper quadrant (series 2, image 42). There is a loculated gas and fluid collection measuring 2.5 x 3.4 x 4.5 cm in the right abdomen just lateral to the inflamed duodenum (series 2, image 38). This collection is located just inferior and posterior to the gallbladder. Free fluid extends  inferiorly within the right hemi abdomen and adjacent to the right kidney. Findings are concerning for acute duodenitis with associated perforation, or possibly perforated duodenum ulcer.  Distally, there is no evidence of bowel obstruction. Contrast material is present within the cecum. The appendix is not definitely visualized, however, no secondary signs of acute appendicitis seen within the right lower quadrant. The colon is within normal limits.  Bladder is unremarkable.  Uterus and ovaries within normal limits.  No free air identified. Normal intravascular enhancement seen throughout the abdomen and pelvis.  No acute osseus abnormality. No worrisome lytic or blastic osseous lesions.  IMPRESSION: Abnormal wall thickening with inflammatory stranding and free fluid about the second- third portion of the duodenum with loculated extraluminal 2.5 x 3.4 x 4.5 cm gas and fluid collection adjacent to the duodenum as above. Findings may reflect an acute duodenitis with perforation or possibly a perforated duodenal ulcer. This collection is suspected to be retroperitoneal in location given its proximity to the pancreas and second portion of the duodenum, as well as the presence of free fluid tracking adjacent to the right kidney. Additionally, the lack of pneumoperitoneum also suggests that this is retroperitoneal in location.  Critical Value/emergent results were called by telephone at the time of interpretation on 01/30/2014 at 3:40 am to Dr. Alvina Chou , who verbally acknowledged these results.   Electronically Signed   By: Jeannine Boga M.D.   On: 01/30/2014 03:39     EKG Interpretation   Date/Time:  Friday January 29 2014 22:33:09 EDT Ventricular Rate:  93 PR Interval:  124 QRS Duration: 80 QT Interval:  348 QTC Calculation: 432 R Axis:   86 Text Interpretation:  Normal sinus rhythm with sinus arrhythmia  Nonspecific ST abnormality Compared to previous tracing Nonspecific ST  abnormality  NOW PRESENT Confirmed by Stevie Kern  MD, Jenny Reichmann ([id number]) on 01/29/2014  10:44:50 PM      MDM   Final diagnoses:  Duodenal perforation    11:03 PM Labs and urinalysis pending. Patient is febrile at 102.6 and has tylenol ordered. Remaining vitals stable.   Labs show elevated WBC and lactic acid. Patient has a perforated duodenum as seen on CT. I spoke with Dr. Marlou Starks about the patient who will see her. Patient to be admitted.     Alvina Chou, PA-C 01/30/14 [place], PA-C 01/31/14 0105

## 2014-01-30 ENCOUNTER — Emergency Department (HOSPITAL_COMMUNITY): Payer: BC Managed Care – PPO

## 2014-01-30 DIAGNOSIS — Z880 Allergy status to penicillin: Secondary | ICD-10-CM | POA: Diagnosis not present

## 2014-01-30 DIAGNOSIS — K651 Peritoneal abscess: Secondary | ICD-10-CM | POA: Diagnosis not present

## 2014-01-30 DIAGNOSIS — K265 Chronic or unspecified duodenal ulcer with perforation: Secondary | ICD-10-CM | POA: Diagnosis present

## 2014-01-30 DIAGNOSIS — E041 Nontoxic single thyroid nodule: Secondary | ICD-10-CM | POA: Diagnosis present

## 2014-01-30 DIAGNOSIS — I10 Essential (primary) hypertension: Secondary | ICD-10-CM | POA: Diagnosis present

## 2014-01-30 DIAGNOSIS — Z79899 Other long term (current) drug therapy: Secondary | ICD-10-CM | POA: Diagnosis not present

## 2014-01-30 DIAGNOSIS — K631 Perforation of intestine (nontraumatic): Secondary | ICD-10-CM | POA: Diagnosis present

## 2014-01-30 DIAGNOSIS — Z881 Allergy status to other antibiotic agents status: Secondary | ICD-10-CM | POA: Diagnosis not present

## 2014-01-30 DIAGNOSIS — K6819 Other retroperitoneal abscess: Secondary | ICD-10-CM | POA: Diagnosis present

## 2014-01-30 DIAGNOSIS — E8779 Other fluid overload: Secondary | ICD-10-CM | POA: Diagnosis not present

## 2014-01-30 DIAGNOSIS — M810 Age-related osteoporosis without current pathological fracture: Secondary | ICD-10-CM | POA: Diagnosis present

## 2014-01-30 DIAGNOSIS — E876 Hypokalemia: Secondary | ICD-10-CM | POA: Diagnosis present

## 2014-01-30 DIAGNOSIS — K319 Disease of stomach and duodenum, unspecified: Secondary | ICD-10-CM | POA: Diagnosis present

## 2014-01-30 DIAGNOSIS — F411 Generalized anxiety disorder: Secondary | ICD-10-CM | POA: Diagnosis present

## 2014-01-30 LAB — CBC WITH DIFFERENTIAL/PLATELET
BASOS ABS: 0 10*3/uL (ref 0.0–0.1)
BASOS PCT: 0 % (ref 0–1)
Basophils Absolute: 0 10*3/uL (ref 0.0–0.1)
Basophils Relative: 0 % (ref 0–1)
EOS ABS: 0 10*3/uL (ref 0.0–0.7)
EOS PCT: 0 % (ref 0–5)
EOS PCT: 0 % (ref 0–5)
Eosinophils Absolute: 0 10*3/uL (ref 0.0–0.7)
HEMATOCRIT: 29 % — AB (ref 36.0–46.0)
HEMATOCRIT: 30.2 % — AB (ref 36.0–46.0)
Hemoglobin: 9.8 g/dL — ABNORMAL LOW (ref 12.0–15.0)
Hemoglobin: 10.3 g/dL — ABNORMAL LOW (ref 12.0–15.0)
LYMPHS ABS: 0.4 10*3/uL — AB (ref 0.7–4.0)
LYMPHS PCT: 4 % — AB (ref 12–46)
Lymphocytes Relative: 5 % — ABNORMAL LOW (ref 12–46)
Lymphs Abs: 0.7 10*3/uL (ref 0.7–4.0)
MCH: 30.5 pg (ref 26.0–34.0)
MCH: 30.8 pg (ref 26.0–34.0)
MCHC: 33.8 g/dL (ref 30.0–36.0)
MCHC: 34.1 g/dL (ref 30.0–36.0)
MCV: 90.3 fL (ref 78.0–100.0)
MCV: 90.4 fL (ref 78.0–100.0)
Monocytes Absolute: 0.6 10*3/uL (ref 0.1–1.0)
Monocytes Absolute: 0.6 10*3/uL (ref 0.1–1.0)
Monocytes Relative: 5 % (ref 3–12)
Monocytes Relative: 5 % (ref 3–12)
Neutro Abs: 10.1 10*3/uL — ABNORMAL HIGH (ref 1.7–7.7)
Neutro Abs: 11.2 10*3/uL — ABNORMAL HIGH (ref 1.7–7.7)
Neutrophils Relative %: 91 % — ABNORMAL HIGH (ref 43–77)
Neutrophils Relative %: 90 % — ABNORMAL HIGH (ref 43–77)
PLATELETS: 170 10*3/uL (ref 150–400)
Platelets: 178 10*3/uL (ref 150–400)
RBC: 3.21 MIL/uL — AB (ref 3.87–5.11)
RBC: 3.34 MIL/uL — ABNORMAL LOW (ref 3.87–5.11)
RDW: 12.9 % (ref 11.5–15.5)
RDW: 13.1 % (ref 11.5–15.5)
WBC: 11 10*3/uL — AB (ref 4.0–10.5)
WBC: 12.5 10*3/uL — ABNORMAL HIGH (ref 4.0–10.5)

## 2014-01-30 LAB — URINALYSIS, ROUTINE W REFLEX MICROSCOPIC
BILIRUBIN URINE: NEGATIVE
Glucose, UA: NEGATIVE mg/dL
Hgb urine dipstick: NEGATIVE
Ketones, ur: 15 mg/dL — AB
Leukocytes, UA: NEGATIVE
NITRITE: NEGATIVE
PROTEIN: NEGATIVE mg/dL
SPECIFIC GRAVITY, URINE: 1.024 (ref 1.005–1.030)
UROBILINOGEN UA: 0.2 mg/dL (ref 0.0–1.0)
pH: 5.5 (ref 5.0–8.0)

## 2014-01-30 LAB — BASIC METABOLIC PANEL
Anion gap: 11 (ref 5–15)
BUN: 12 mg/dL (ref 6–23)
CALCIUM: 8.3 mg/dL — AB (ref 8.4–10.5)
CO2: 23 meq/L (ref 19–32)
Chloride: 104 mEq/L (ref 96–112)
Creatinine, Ser: 0.58 mg/dL (ref 0.50–1.10)
GFR calc Af Amer: >90 mL/min (ref >90)
GLUCOSE: 156 mg/dL — AB (ref 70–99)
Potassium: 3.9 mEq/L (ref 3.7–5.3)
SODIUM: 138 meq/L (ref 137–147)

## 2014-01-30 MED ORDER — SODIUM CHLORIDE 0.9 % IV SOLN
250.0000 mg | Freq: Four times a day (QID) | INTRAVENOUS | Status: DC
  Administered 2014-01-30 – 2014-02-11 (×48): 250 mg via INTRAVENOUS
  Filled 2014-01-30 (×55): qty 250

## 2014-01-30 MED ORDER — ONDANSETRON HCL 4 MG/2ML IJ SOLN
4.0000 mg | Freq: Four times a day (QID) | INTRAMUSCULAR | Status: DC | PRN
  Administered 2014-01-30 – 2014-02-07 (×24): 4 mg via INTRAVENOUS
  Filled 2014-01-30 (×28): qty 2

## 2014-01-30 MED ORDER — HYDROMORPHONE HCL PF 1 MG/ML IJ SOLN
0.5000 mg | INTRAMUSCULAR | Status: DC | PRN
  Administered 2014-01-30: 0.5 mg via INTRAVENOUS
  Administered 2014-01-31 – 2014-02-02 (×6): 1 mg via INTRAVENOUS
  Filled 2014-01-30 (×6): qty 1

## 2014-01-30 MED ORDER — MORPHINE SULFATE 4 MG/ML IJ SOLN
4.0000 mg | Freq: Once | INTRAMUSCULAR | Status: AC
  Administered 2014-01-30: 4 mg via INTRAVENOUS

## 2014-01-30 MED ORDER — HYDROMORPHONE HCL PF 1 MG/ML IJ SOLN
INTRAMUSCULAR | Status: AC
  Administered 2014-01-30: 0.5 mg via INTRAVENOUS
  Filled 2014-01-30: qty 1

## 2014-01-30 MED ORDER — PANTOPRAZOLE SODIUM 40 MG IV SOLR
40.0000 mg | Freq: Two times a day (BID) | INTRAVENOUS | Status: DC
  Administered 2014-01-30 – 2014-02-16 (×35): 40 mg via INTRAVENOUS
  Filled 2014-01-30 (×45): qty 40

## 2014-01-30 MED ORDER — KCL IN DEXTROSE-NACL 20-5-0.9 MEQ/L-%-% IV SOLN
INTRAVENOUS | Status: DC
  Administered 2014-01-30 – 2014-02-01 (×4): via INTRAVENOUS
  Filled 2014-01-30 (×8): qty 1000

## 2014-01-30 MED ORDER — HYDROMORPHONE HCL PF 1 MG/ML IJ SOLN
1.0000 mg | Freq: Once | INTRAMUSCULAR | Status: AC
Start: 2014-01-30 — End: 2014-01-30
  Administered 2014-01-30: 1 mg via INTRAVENOUS
  Filled 2014-01-30: qty 1

## 2014-01-30 MED ORDER — MORPHINE SULFATE 2 MG/ML IJ SOLN: 2.0000 mg | INTRAMUSCULAR | Status: DC | PRN

## 2014-01-30 MED ORDER — IOHEXOL 300 MG/ML  SOLN
100.0000 mL | Freq: Once | INTRAMUSCULAR | Status: AC | PRN
  Administered 2014-01-30: 100 mL via INTRAVENOUS

## 2014-01-30 NOTE — ED Provider Notes (Signed)
Medical screening examination/treatment/procedure(s) were performed by a resident physician or non-physician practitioner and as the supervising physician I was immediately available for consultation/collaboration.  Lynne Leader, MD   Gregor Hams, MD 01/30/14 [phone]

## 2014-01-30 NOTE — ED Notes (Signed)
This RN started a liter fluid bolus due to pt's low BP.

## 2014-01-30 NOTE — H&P (Signed)
Brianna Fuller is an 63 y.o. female.   Chief Complaint: abdominal pain HPI: The pt is a 63 yo wf who was feeling well until about 3am Friday morning when she developed abdominal pain. Pain was all over but worse on the right. No nausea or vomiting. She does have a fever. She went to ER and CT shows what looks like a contained posterior duodenal perf. No extrav of oral contrast. She does not take much aspirin or ibuprofen. She is otherwise fairly healthy  Past Medical History  Diagnosis Date  . Thyroid nodule 4/09    right  . Osteoporosis     Past Surgical History  Procedure Laterality Date  . Combined hysteroscopy diagnostic / d&c  12/09    Family History  Problem Relation Age of Onset  . Hypertension Father   . Cancer Brother     skin  . Osteoporosis Mother   . Hyperlipidemia Mother    Social History:  reports that she has never smoked. She has never used smokeless tobacco. She reports that she drinks about 2 - 2.5 ounces of alcohol per week. She reports that she does not use illicit drugs.  Allergies:  Allergies  Allergen Reactions  . Penicillins     Some forms of penicillin  . Amoxicillin Rash  . Other Rash    Patient cannot remember name of medication but knows it was an antibiotic used to treat UTI     (Not in a hospital admission)  Results for orders placed during the hospital encounter of 01/29/14 (from the past 48 hour(s))  CBC WITH DIFFERENTIAL     Status: Abnormal   Collection Time    01/29/14 10:56 PM      Result Value Ref Range   WBC 13.4 (*) 4.0 - 10.5 K/uL   RBC 3.95  3.87 - 5.11 MIL/uL   Hemoglobin 12.1  12.0 - 15.0 g/dL   HCT 35.4 (*) 36.0 - 46.0 %   MCV 89.6  78.0 - 100.0 fL   MCH 30.6  26.0 - 34.0 pg   MCHC 34.2  30.0 - 36.0 g/dL   RDW 12.6  11.5 - 15.5 %   Platelets 200  150 - 400 K/uL   Neutrophils Relative % 94 (*) 43 - 77 %   Neutro Abs 12.6 (*) 1.7 - 7.7 K/uL   Lymphocytes Relative 2 (*) 12 - 46 %   Lymphs Abs 0.2 (*) 0.7 - 4.0 K/uL    Monocytes Relative 4  3 - 12 %   Monocytes Absolute 0.6  0.1 - 1.0 K/uL   Eosinophils Relative 0  0 - 5 %   Eosinophils Absolute 0.0  0.0 - 0.7 K/uL   Basophils Relative 0  0 - 1 %   Basophils Absolute 0.0  0.0 - 0.1 K/uL  COMPREHENSIVE METABOLIC PANEL     Status: Abnormal   Collection Time    01/29/14 10:56 PM      Result Value Ref Range   Sodium 139  137 - 147 mEq/L   Potassium 3.5 (*) 3.7 - 5.3 mEq/L   Chloride 102  96 - 112 mEq/L   CO2 21  19 - 32 mEq/L   Glucose, Bld 135 (*) 70 - 99 mg/dL   BUN 16  6 - 23 mg/dL   Creatinine, Ser 0.58  0.50 - 1.10 mg/dL   Calcium 9.4  8.4 - 10.5 mg/dL   Total Protein 6.9  6.0 - 8.3 g/dL   Albumin 3.9  3.5 - 5.2 g/dL   AST 24  0 - 37 U/L   ALT 17  0 - 35 U/L   Alkaline Phosphatase 65  39 - 117 U/L   Total Bilirubin 0.7  0.3 - 1.2 mg/dL   GFR calc non Af Amer >90  >90 mL/min   GFR calc Af Amer >90  >90 mL/min   Comment: (NOTE)     The eGFR has been calculated using the CKD EPI equation.     This calculation has not been validated in all clinical situations.     eGFR's persistently <90 mL/min signify possible Chronic Kidney     Disease.   Anion gap 16 (*) 5 - 15  LIPASE, BLOOD     Status: None   Collection Time    01/29/14 10:56 PM      Result Value Ref Range   Lipase 21  11 - 59 U/L  I-STAT CG4 LACTIC ACID, ED     Status: Abnormal   Collection Time    01/29/14 11:04 PM      Result Value Ref Range   Lactic Acid, Venous 2.28 (*) 0.5 - 2.2 mmol/L  I-STAT TROPOININ, ED     Status: None   Collection Time    01/29/14 11:24 PM      Result Value Ref Range   Troponin i, poc 0.08  0.00 - 0.08 ng/mL   Comment 3            Comment: Due to the release kinetics of cTnI,     a negative result within the first hours     of the onset of symptoms does not rule out     myocardial infarction with certainty.     If myocardial infarction is still suspected,     repeat the test at appropriate intervals.  URINALYSIS, ROUTINE W REFLEX MICROSCOPIC      Status: Abnormal   Collection Time    01/30/14  2:13 AM      Result Value Ref Range   Color, Urine AMBER (*) YELLOW   Comment: BIOCHEMICALS MAY BE AFFECTED BY COLOR   APPearance CLOUDY (*) CLEAR   Specific Gravity, Urine 1.024  1.005 - 1.030   pH 5.5  5.0 - 8.0   Glucose, UA NEGATIVE  NEGATIVE mg/dL   Hgb urine dipstick NEGATIVE  NEGATIVE   Bilirubin Urine NEGATIVE  NEGATIVE   Ketones, ur 15 (*) NEGATIVE mg/dL   Protein, ur NEGATIVE  NEGATIVE mg/dL   Urobilinogen, UA 0.2  0.0 - 1.0 mg/dL   Nitrite NEGATIVE  NEGATIVE   Leukocytes, UA NEGATIVE  NEGATIVE   Comment: MICROSCOPIC NOT DONE ON URINES WITH NEGATIVE PROTEIN, BLOOD, LEUKOCYTES, NITRITE, OR GLUCOSE <1000 mg/dL.   Dg Chest 2 View  01/30/2014   CLINICAL DATA:  Chest pain  EXAM: CHEST  2 VIEW  COMPARISON:  None available.  FINDINGS: The cardiac and mediastinal silhouettes are within normal limits.  The lungs are normally inflated. Bibasilar linear opacities are most consistent with atelectasis, left greater than right. No airspace consolidation, pleural effusion, or pulmonary edema is identified. There is no pneumothorax.  No acute osseous abnormality identified.  IMPRESSION: Mild bibasilar atelectasis. No other acute cardiopulmonary abnormality.   Electronically Signed   By: Jeannine Boga M.D.   On: 01/30/2014 00:37   Ct Abdomen Pelvis W Contrast  01/30/2014   CLINICAL DATA:  Right abdominal pain, fever.  EXAM: CT ABDOMEN AND PELVIS WITH CONTRAST  TECHNIQUE: Multidetector CT imaging  of the abdomen and pelvis was performed using the standard protocol following bolus administration of intravenous contrast.  CONTRAST:  166m OMNIPAQUE IOHEXOL 300 MG/ML  SOLN  COMPARISON:  None.  FINDINGS: Patchy bibasilar opacities present within the partially visualized lungs are most consistent with atelectasis.  Liver demonstrates a normal contrast enhanced appearance. Prominent periportal edema noted.  Gallbladder within normal limits. No biliary  dilatation. The spleen, adrenal glands, and pancreas demonstrate a normal contrast enhanced appearance.  Kidneys are equal in size with symmetric enhancement. No nephrolithiasis, hydronephrosis, or focal enhancing renal mass. Subcentimeter hypodensity within the mid left kidney is too small the characterize, but statistically likely represents a small cyst.  Stomach within normal limits. There is extensive wall thickening with inflammatory stranding and free fluid about the second-third portion of the duodenum in the right upper quadrant (series 2, image 42). There is a loculated gas and fluid collection measuring 2.5 x 3.4 x 4.5 cm in the right abdomen just lateral to the inflamed duodenum (series 2, image 38). This collection is located just inferior and posterior to the gallbladder. Free fluid extends inferiorly within the right hemi abdomen and adjacent to the right kidney. Findings are concerning for acute duodenitis with associated perforation, or possibly perforated duodenum ulcer.  Distally, there is no evidence of bowel obstruction. Contrast material is present within the cecum. The appendix is not definitely visualized, however, no secondary signs of acute appendicitis seen within the right lower quadrant. The colon is within normal limits.  Bladder is unremarkable.  Uterus and ovaries within normal limits.  No free air identified. Normal intravascular enhancement seen throughout the abdomen and pelvis.  No acute osseus abnormality. No worrisome lytic or blastic osseous lesions.  IMPRESSION: Abnormal wall thickening with inflammatory stranding and free fluid about the second- third portion of the duodenum with loculated extraluminal 2.5 x 3.4 x 4.5 cm gas and fluid collection adjacent to the duodenum as above. Findings may reflect an acute duodenitis with perforation or possibly a perforated duodenal ulcer. This collection is suspected to be retroperitoneal in location given its proximity to the pancreas  and second portion of the duodenum, as well as the presence of free fluid tracking adjacent to the right kidney. Additionally, the lack of pneumoperitoneum also suggests that this is retroperitoneal in location.  Critical Value/emergent results were called by telephone at the time of interpretation on 01/30/2014 at 3:40 am to Dr. KAlvina Chou, who verbally acknowledged these results.   Electronically Signed   By: BJeannine BogaM.D.   On: 01/30/2014 03:39    Review of Systems  Constitutional: Positive for fever.  HENT: Negative.   Eyes: Negative.   Respiratory: Negative.   Cardiovascular: Negative.   Gastrointestinal: Positive for abdominal pain.  Genitourinary: Negative.   Musculoskeletal: Negative.   Skin: Negative.   Neurological: Negative.   Endo/Heme/Allergies: Negative.   Psychiatric/Behavioral: Negative.     Blood pressure 96/58, pulse 85, temperature 102.6 F (39.2 C), temperature source Oral, resp. rate 21, last menstrual period 06/12/2007, SpO2 100.00%. Physical Exam  Constitutional: She is oriented to person, place, and time. She appears well-developed and well-nourished.  HENT:  Head: Normocephalic and atraumatic.  Eyes: Conjunctivae and EOM are normal. Pupils are equal, round, and reactive to light.  Neck: Normal range of motion. Neck supple.  Cardiovascular: Normal rate, regular rhythm and normal heart sounds.   Respiratory: Effort normal and breath sounds normal.  GI: Soft. Bowel sounds are normal.  Firm but no guarding.  Mild to moderated diffuse tenderness  Musculoskeletal: Normal range of motion.  Neurological: She is alert and oriented to person, place, and time.  Skin: Skin is warm and dry.  Psychiatric: She has a normal mood and affect. Her behavior is normal.     Assessment/Plan The pt appears to have a contained posterior perforation of duodenum. She does not appear toxic. Because of this and the difficulty of effectively controlling this area i  think it would be reasonable to admit her for bowel rest and antibiotics and close monitoring in the ICU. If she improves then that would be good for her. If she does not then we would consider exploration for possible drainage and exclusion  TOTH III,Lyle Niblett S 01/30/2014, 4:51 AM

## 2014-01-30 NOTE — Progress Notes (Signed)
Pt seen and examined. Low grade temp 100.4. Not tachycardiac. Wbc down Reports feel better; less pain; able to take deeper breath without having epigastric pain (like before)  Alert, nontoxic, nad Resting comfortably Soft, nd, not really ttp.   Cont current plan Discussed plan with pt. Hopefully will continue to do well. Discussed possibility that if worsens or fails to improve may need surgery  Leighton Ruff. Redmond Pulling, MD, FACS General, Bariatric, & Minimally Invasive Surgery [facility], [place]

## 2014-01-30 NOTE — ED Notes (Signed)
Attempted report 

## 2014-01-30 NOTE — Progress Notes (Signed)
ANTIBIOTIC CONSULT NOTE - INITIAL  Pharmacy Consult for Primaxin Indication: duodenal perforation  Allergies  Allergen Reactions  . Penicillins     Some forms of penicillin  . Amoxicillin Rash  . Other Rash    Patient cannot remember name of medication but knows it was an antibiotic used to treat UTI    Patient Measurements: Height: 5\' 4"  (162.6 cm) Weight: 134 lb 4.2 oz (60.9 kg) IBW/kg (Calculated) : 54.7  Vital Signs: Temp: 98.7 F (37.1 C) (08/22 0651) Temp src: Oral (08/22 0651) BP: 101/58 mmHg (08/22 0734) Pulse Rate: 101 (08/22 0734) Intake/Output from previous day:   Intake/Output from this shift:    Labs:  Recent Labs  01/29/14 2256  WBC 13.4*  HGB 12.1  PLT 200  CREATININE 0.58   Estimated Creatinine Clearance: 63 ml/min (by C-G formula based on Cr of 0.58). No results found for this basename: VANCOTROUGH, VANCOPEAK, VANCORANDOM, GENTTROUGH, GENTPEAK, GENTRANDOM, TOBRATROUGH, TOBRAPEAK, TOBRARND, AMIKACINPEAK, AMIKACINTROU, AMIKACIN,  in the last 72 hours   Microbiology: No results found for this or any previous visit (from the past 720 hour(s)).  Medical History: Past Medical History  Diagnosis Date  . Thyroid nodule 4/09    right  . Osteoporosis     Medications:  Scheduled:  . imipenem-cilastatin  250 mg Intravenous Q6H  . pantoprazole (PROTONIX) IV  40 mg Intravenous Q12H   Assessment: 63 yo female with abdominal pain/duodenal perforation, for empiric antibiotics  Plan:  Primaxin 250 mg IV q6h  Caryl Pina 01/30/2014,7:40 AM

## 2014-01-31 LAB — CBC
HEMATOCRIT: 30.8 % — AB (ref 36.0–46.0)
Hemoglobin: 10.3 g/dL — ABNORMAL LOW (ref 12.0–15.0)
MCH: 30.6 pg (ref 26.0–34.0)
MCHC: 33.4 g/dL (ref 30.0–36.0)
MCV: 91.4 fL (ref 78.0–100.0)
PLATELETS: 163 10*3/uL (ref 150–400)
RBC: 3.37 MIL/uL — ABNORMAL LOW (ref 3.87–5.11)
RDW: 13.3 % (ref 11.5–15.5)
WBC: 11.6 10*3/uL — ABNORMAL HIGH (ref 4.0–10.5)

## 2014-01-31 LAB — LACTIC ACID, PLASMA: LACTIC ACID, VENOUS: 1.2 mmol/L (ref 0.5–2.2)

## 2014-01-31 LAB — BASIC METABOLIC PANEL
ANION GAP: 8 (ref 5–15)
BUN: 13 mg/dL (ref 6–23)
CALCIUM: 8.1 mg/dL — AB (ref 8.4–10.5)
CO2: 23 meq/L (ref 19–32)
CREATININE: 0.68 mg/dL (ref 0.50–1.10)
Chloride: 109 mEq/L (ref 96–112)
Glucose, Bld: 170 mg/dL — ABNORMAL HIGH (ref 70–99)
Potassium: 4.1 mEq/L (ref 3.7–5.3)
Sodium: 140 mEq/L (ref 137–147)

## 2014-01-31 NOTE — Progress Notes (Signed)
Subjective: She feels a little better than yesterday. Only low grade fever. No nausea  Objective: Vital signs in last 24 hours: Temp:  [100.1 F (37.8 C)-100.8 F (38.2 C)] 100.1 F (37.8 C) (08/23 0400) Pulse Rate:  [79-105] 102 (08/23 0800) Resp:  [15-28] 20 (08/23 0800) BP: (89-112)/(46-65) 110/61 mmHg (08/23 0800) SpO2:  [90 %-100 %] 100 % (08/23 0800)    Intake/Output from previous day: 08/22 0701 - 08/23 0700 In: 2880 [I.V.:2580; IV Piggyback:300] Out: -  Intake/Output this shift:    Resp: clear to auscultation bilaterally Cardio: regular rate and rhythm GI: soft and nontender on left, moderate tenderness on right. good bs  Lab Results:   Recent Labs  01/30/14 1720 01/31/14 0230  WBC 12.5* 11.6*  HGB 10.3* 10.3*  HCT 30.2* 30.8*  PLT 170 163   BMET  Recent Labs  01/30/14 1010 01/31/14 0230  NA 138 140  K 3.9 4.1  CL 104 109  CO2 23 23  GLUCOSE 156* 170*  BUN 12 13  CREATININE 0.58 0.68  CALCIUM 8.3* 8.1*   PT/INR No results found for this basename: LABPROT, INR,  in the last 72 hours ABG No results found for this basename: PHART, PCO2, PO2, HCO3,  in the last 72 hours  Studies/Results: Dg Chest 2 View  01/30/2014   CLINICAL DATA:  Chest pain  EXAM: CHEST  2 VIEW  COMPARISON:  None available.  FINDINGS: The cardiac and mediastinal silhouettes are within normal limits.  The lungs are normally inflated. Bibasilar linear opacities are most consistent with atelectasis, left greater than right. No airspace consolidation, pleural effusion, or pulmonary edema is identified. There is no pneumothorax.  No acute osseous abnormality identified.  IMPRESSION: Mild bibasilar atelectasis. No other acute cardiopulmonary abnormality.   Electronically Signed   By: Jeannine Boga M.D.   On: 01/30/2014 00:37   Ct Abdomen Pelvis W Contrast  01/30/2014   CLINICAL DATA:  Right abdominal pain, fever.  EXAM: CT ABDOMEN AND PELVIS WITH CONTRAST  TECHNIQUE:  Multidetector CT imaging of the abdomen and pelvis was performed using the standard protocol following bolus administration of intravenous contrast.  CONTRAST:  128mL OMNIPAQUE IOHEXOL 300 MG/ML  SOLN  COMPARISON:  None.  FINDINGS: Patchy bibasilar opacities present within the partially visualized lungs are most consistent with atelectasis.  Liver demonstrates a normal contrast enhanced appearance. Prominent periportal edema noted.  Gallbladder within normal limits. No biliary dilatation. The spleen, adrenal glands, and pancreas demonstrate a normal contrast enhanced appearance.  Kidneys are equal in size with symmetric enhancement. No nephrolithiasis, hydronephrosis, or focal enhancing renal mass. Subcentimeter hypodensity within the mid left kidney is too small the characterize, but statistically likely represents a small cyst.  Stomach within normal limits. There is extensive wall thickening with inflammatory stranding and free fluid about the second-third portion of the duodenum in the right upper quadrant (series 2, image 42). There is a loculated gas and fluid collection measuring 2.5 x 3.4 x 4.5 cm in the right abdomen just lateral to the inflamed duodenum (series 2, image 38). This collection is located just inferior and posterior to the gallbladder. Free fluid extends inferiorly within the right hemi abdomen and adjacent to the right kidney. Findings are concerning for acute duodenitis with associated perforation, or possibly perforated duodenum ulcer.  Distally, there is no evidence of bowel obstruction. Contrast material is present within the cecum. The appendix is not definitely visualized, however, no secondary signs of acute appendicitis seen within the right  lower quadrant. The colon is within normal limits.  Bladder is unremarkable.  Uterus and ovaries within normal limits.  No free air identified. Normal intravascular enhancement seen throughout the abdomen and pelvis.  No acute osseus abnormality.  No worrisome lytic or blastic osseous lesions.  IMPRESSION: Abnormal wall thickening with inflammatory stranding and free fluid about the second- third portion of the duodenum with loculated extraluminal 2.5 x 3.4 x 4.5 cm gas and fluid collection adjacent to the duodenum as above. Findings may reflect an acute duodenitis with perforation or possibly a perforated duodenal ulcer. This collection is suspected to be retroperitoneal in location given its proximity to the pancreas and second portion of the duodenum, as well as the presence of free fluid tracking adjacent to the right kidney. Additionally, the lack of pneumoperitoneum also suggests that this is retroperitoneal in location.  Critical Value/emergent results were called by telephone at the time of interpretation on 01/30/2014 at 3:40 am to Dr. Alvina Chou , who verbally acknowledged these results.   Electronically Signed   By: Jeannine Boga M.D.   On: 01/30/2014 03:39    Anti-infectives: Anti-infectives   Start     Dose/Rate Route Frequency Ordered Stop   01/30/14 0800  imipenem-cilastatin (PRIMAXIN) 250 mg in sodium chloride 0.9 % 100 mL IVPB     250 mg 200 mL/hr over 30 Minutes Intravenous Every 6 hours 01/30/14 0740        Assessment/Plan: s/p * No surgery found * continue bowel rest Invanz Will place picc tomorrow for tpn Continue to monitor in icu another day  LOS: 2 days    TOTH III,PAUL S 01/31/2014

## 2014-01-31 NOTE — Progress Notes (Signed)
Patient ID: Brianna Fuller, female   DOB: Jan 30, 1951, 63 y.o.   MRN: [id number]  Subjective:    63 yrs Single Caucasian [id number]  female here to discuss recent BMD obtained 12/21/13 showing osteoporosis with t score right femoral neck -2.5.  Left femoral neck t score -2.2.  Spine t score -2.4.  Forearm also measured with radius UD score -3.1.  Total radius score was -1.7.       CNM, French Ana, reviewed initial report and she recommended consultation with patient.  Review of prior BMD with pt showed T scores of -2.3 in each hip, -2.3 in spine.  There was no significant change in hip or spine measurements.  Forearm had never been measured in the past.  Osteoporosis Risk Factors  Nonmodifiable Personal Hx of fracture as an adult: no Hx of fracture in first-degree relative: no but + family hx of osteoporosis Caucasian race: yes Advanced age: no Female sex: yes Dementia: no Poor health/frailty: no  Potentially modifiable: Tobacco use: no Low body weight (<127 lbs): no Estrogen deficiency  early menopause (age <45) or bilateral ovariectomy: no  prolonged premenopausal amenorrhea (>1 yr): no Low calcium intake (lifelong): no Alcohol use more than 2 drinks per day: no Recurrent falls: no Inadequate physical activity: no  Current calcium and Vit D intake: 500-1000mg  Calcium daily with 2000 IU Vit D daily.    Objective:   PHYSICAL EXAM BP 90/58  Pulse 72  Resp 18  Ht 5\' 4"  (1.626 m)  Wt 129 lb (58.514 kg)  BMI 22.13 kg/m2  LMP 06/12/2007 General appearance: alert and cooperative  Imaging Bone Density: Spine T Score: -2.4, Hip T Score: -2.5 right femoral neck, Forearm T Score: -1.7 done on 12/21/13  FRAX score:  Not calculated                                       Assessment:   Osteoporosis with T score -2.5 in right femoral neck   Plan:   Pt and I had lengthy conversation about current BMD testing.  She does have increased fracture risk due to her significant  osteopenia/borderline osteoporosis but this is not new.  She has been aware of these findings for several years.  She is busy and active and does do regular weight bearing exercise.  She really is not interested in treatment.  Her BMD is basically stable so I do not feel she is having significant change and, in regards to treatment, stability is considered "response to therapy".  Therefore, I do not think she needs to initiate any treatment at this time.  Pt very much in agreement with this.  We agreed: 1.  Patient will continue calcium and Vit D therapy 2.  Pt will continue her regular exercise  3.  Repeat BMD in 2 year.  4.  Pt knows to limit ETOH and avoid smoking   ~30 minutes spent with patient >50% of time was in face to face discussion of above.

## 2014-02-01 ENCOUNTER — Encounter (HOSPITAL_COMMUNITY): Payer: Self-pay | Admitting: Infectious Diseases

## 2014-02-01 LAB — BASIC METABOLIC PANEL
Anion gap: 12 (ref 5–15)
BUN: 10 mg/dL (ref 6–23)
CALCIUM: 8.6 mg/dL (ref 8.4–10.5)
CO2: 18 mEq/L — ABNORMAL LOW (ref 19–32)
Chloride: 114 mEq/L — ABNORMAL HIGH (ref 96–112)
Creatinine, Ser: 0.46 mg/dL — ABNORMAL LOW (ref 0.50–1.10)
GFR calc Af Amer: >90 mL/min (ref >90)
GLUCOSE: 135 mg/dL — AB (ref 70–99)
Potassium: 5 mEq/L (ref 3.7–5.3)
SODIUM: 144 meq/L (ref 137–147)

## 2014-02-01 LAB — MAGNESIUM: MAGNESIUM: 1.9 mg/dL (ref 1.5–2.5)

## 2014-02-01 LAB — PHOSPHORUS: Phosphorus: 1.3 mg/dL — ABNORMAL LOW (ref 2.3–4.6)

## 2014-02-01 LAB — GLUCOSE, CAPILLARY: Glucose-Capillary: 131 mg/dL — ABNORMAL HIGH (ref 70–99)

## 2014-02-01 MED ORDER — INSULIN ASPART 100 UNIT/ML ~~LOC~~ SOLN
0.0000 [IU] | Freq: Four times a day (QID) | SUBCUTANEOUS | Status: DC
  Administered 2014-02-02 – 2014-02-05 (×9): 1 [IU] via SUBCUTANEOUS

## 2014-02-01 MED ORDER — TRACE MINERALS CR-CU-F-FE-I-MN-MO-SE-ZN IV SOLN
INTRAVENOUS | Status: AC
  Administered 2014-02-01: 18:00:00 via INTRAVENOUS
  Filled 2014-02-01: qty 1000

## 2014-02-01 MED ORDER — DEXTROSE-NACL 5-0.9 % IV SOLN
INTRAVENOUS | Status: AC
  Administered 2014-02-01 – 2014-02-03 (×4): via INTRAVENOUS

## 2014-02-01 MED ORDER — SODIUM CHLORIDE 0.9 % IJ SOLN
10.0000 mL | INTRAMUSCULAR | Status: DC | PRN
  Administered 2014-02-02 – 2014-02-15 (×8): 10 mL

## 2014-02-01 MED ORDER — SODIUM PHOSPHATE 3 MMOLE/ML IV SOLN
25.0000 mmol | Freq: Once | INTRAVENOUS | Status: AC
  Administered 2014-02-01: 25 mmol via INTRAVENOUS
  Filled 2014-02-01: qty 8.33

## 2014-02-01 MED ORDER — SODIUM CHLORIDE 0.9 % IJ SOLN
10.0000 mL | Freq: Two times a day (BID) | INTRAMUSCULAR | Status: DC
  Administered 2014-02-01 – 2014-02-14 (×4): 10 mL

## 2014-02-01 MED ORDER — KCL IN DEXTROSE-NACL 20-5-0.9 MEQ/L-%-% IV SOLN
INTRAVENOUS | Status: DC
  Filled 2014-02-01: qty 1000

## 2014-02-01 MED ORDER — FAT EMULSION 20 % IV EMUL
250.0000 mL | INTRAVENOUS | Status: AC
  Administered 2014-02-01: 250 mL via INTRAVENOUS
  Filled 2014-02-01: qty 250

## 2014-02-01 MED ORDER — HEPARIN SODIUM (PORCINE) 5000 UNIT/ML IJ SOLN
5000.0000 [IU] | Freq: Three times a day (TID) | INTRAMUSCULAR | Status: DC
  Administered 2014-02-01 – 2014-02-05 (×12): 5000 [IU] via SUBCUTANEOUS
  Filled 2014-02-01 (×19): qty 1

## 2014-02-01 NOTE — Progress Notes (Addendum)
INITIAL NUTRITION ASSESSMENT  DOCUMENTATION CODES Per approved criteria  -Not Applicable   INTERVENTION:  TPN per pharmacy RD to follow for nutrition care plan  NUTRITION DIAGNOSIS: Inadequate oral intake related to altered GI function as evidenced by NPO status  Goal: Pt to meet >/= 90% of their estimated nutrition needs   Monitor:  TPN prescription, PO diet advancement, weight, labs, I/O's  Reason for Assessment: Consult   63 y.o. female  Admitting Dx: abdominal pain  ASSESSMENT: 63 yo Female who was feeling well until about 3am Friday morning when she developed abdominal pain. Pain was all over but worse on the right. No nausea or vomiting. She does have a fever. She went to ER and CT shows what looks like a contained posterior duodenal perforation.  Patient reports a good appetite PTA.  No recent weight loss.  No muscle or subcutaneous fat depletion noticed.  Plan is for bowel rest and ABX therapy.  RD consulted for new TPN.  Patient is receiving TPN with Clinimix E 5/15 @ 30 ml/hr and lipids @ 10 ml/hr. Provides 991 kcal and 36 grams protein per day. Meets 66% minimum estimated energy needs and 51% minimum estimated protein needs.  Height: Ht Readings from Last 1 Encounters:  01/30/14 5\' 4"  (1.626 m)    Weight: Wt Readings from Last 1 Encounters:  01/30/14 134 lb 4.2 oz (60.9 kg)    Ideal Body Weight: 120 lb  % Ideal Body Weight: 111%  Wt Readings from Last 10 Encounters:  01/30/14 134 lb 4.2 oz (60.9 kg)  01/19/14 129 lb (58.514 kg)  12/17/13 128 lb (58.06 kg)  12/16/12 130 lb (58.968 kg)  09/22/11 130 lb (58.968 kg)    Usual Body Weight: 128 lb  % Usual Body Weight: 105%  BMI:  Body mass index is 23.03 kg/(m^2).  Estimated Nutritional Needs: Kcal: 1500-1700 Protein: 70-80 gm Fluid: 1.5-1.7 L  Skin: Intact  Diet Order: NPO  EDUCATION NEEDS: -No education needs identified at this time   Intake/Output Summary (Last 24 hours) at  02/01/14 1504 Last data filed at 02/01/14 1400  Gross per 24 hour  Intake 3277.08 ml  Output    750 ml  Net 2527.08 ml   Labs:   Recent Labs Lab 01/30/14 1010 01/31/14 0230 02/01/14 0949  NA 138 140 144  K 3.9 4.1 5.0  CL 104 109 114*  CO2 23 23 18*  BUN 12 13 10   CREATININE 0.58 0.68 0.46*  CALCIUM 8.3* 8.1* 8.6  MG  --   --  1.9  PHOS  --   --  1.3*  GLUCOSE 156* 170* 135*    Scheduled Meds: . heparin subcutaneous  5,000 Units Subcutaneous 3 times per day  . imipenem-cilastatin  250 mg Intravenous Q6H  . [START ON 02/02/2014] insulin aspart  0-9 Units Subcutaneous 4 times per day  . pantoprazole (PROTONIX) IV  40 mg Intravenous Q12H  . sodium chloride  10-40 mL Intracatheter Q12H  . sodium phosphate  Dextrose 5% IVPB  25 mmol Intravenous Once    Continuous Infusions: . dextrose 5 % and 0.9% NaCl 125 mL/hr at 02/01/14 1315  . Marland KitchenTPN (CLINIMIX-E) Adult     And  . fat emulsion      Past Medical History  Diagnosis Date  . Thyroid nodule 4/09    right  . Osteoporosis   . Hypertension     Past Surgical History  Procedure Laterality Date  . Combined hysteroscopy diagnostic / d&c  12/09    Arthur Holms, RD, LDN Pager #: [phone] After-Hours Pager #: [phone]

## 2014-02-01 NOTE — Progress Notes (Signed)
Utilization review completed. Barnell Shieh, RN, BSN. 

## 2014-02-01 NOTE — Progress Notes (Signed)
  Subjective: Abdominal pain is better  Objective: Vital signs in last 24 hours: Temp:  [97.9 F (36.6 C)-99.9 F (37.7 C)] 97.9 F (36.6 C) (08/24 0740) Pulse Rate:  [75-110] 95 (08/24 0700) Resp:  [9-22] 20 (08/24 0700) BP: (65-115)/(35-66) 115/66 mmHg (08/24 0700) SpO2:  [94 %-100 %] 98 % (08/24 0700)    Intake/Output from previous day: 08/23 0701 - 08/24 0700 In: 3200 [I.V.:3000; IV Piggyback:200] Out: 1100 [Urine:1100] Intake/Output this shift: Total I/O In: 325 [I.V.:125; IV Piggyback:200] Out: 200 [Urine:200]  General appearance: alert and cooperative Resp: clear to auscultation bilaterally Cardio: regular rate and rhythm GI: soft without appreciable tenderness  Lab Results:   Recent Labs  01/30/14 1720 01/31/14 0230  WBC 12.5* 11.6*  HGB 10.3* 10.3*  HCT 30.2* 30.8*  PLT 170 163   BMET  Recent Labs  01/30/14 1010 01/31/14 0230  NA 138 140  K 3.9 4.1  CL 104 109  CO2 23 23  GLUCOSE 156* 170*  BUN 12 13  CREATININE 0.58 0.68  CALCIUM 8.3* 8.1*   PT/INR No results found for this basename: LABPROT, INR,  in the last 72 hours ABG No results found for this basename: PHART, PCO2, PO2, HCO3,  in the last 72 hours  Studies/Results: No results found.  Anti-infectives: Anti-infectives   Start     Dose/Rate Route Frequency Ordered Stop   01/30/14 0800  imipenem-cilastatin (PRIMAXIN) 250 mg in sodium chloride 0.9 % 100 mL IVPB     250 mg 200 mL/hr over 30 Minutes Intravenous Every 6 hours 01/30/14 0740        Assessment/Plan: Retroperitoneal duodenal perforation - bowel rest, Invanz, temp curve improving ID - Invanz as above FEN - place PICC and start TNA VTE - start SQ heparin To floor  LOS: 3 days    Brianna Fuller E 02/01/2014

## 2014-02-01 NOTE — Progress Notes (Signed)
Peripherally Inserted Central Catheter/Midline Placement  The IV Nurse has discussed with the patient and/or persons authorized to consent for the patient, the purpose of this procedure and the potential benefits and risks involved with this procedure.  The benefits include less needle sticks, lab draws from the catheter and patient may be discharged home with the catheter.  Risks include, but not limited to, infection, bleeding, blood clot (thrombus formation), and puncture of an artery; nerve damage and irregular heat beat.  Alternatives to this procedure were also discussed.  PICC/Midline Placement Documentation        Brianna Fuller 02/01/2014, 12:37 PM

## 2014-02-01 NOTE — ED Provider Notes (Signed)
Medical screening examination/treatment/procedure(s) were performed by non-physician practitioner and as supervising physician I was immediately available for consultation/collaboration.   EKG Interpretation   Date/Time:  Friday January 29 2014 22:33:09 EDT Ventricular Rate:  93 PR Interval:  124 QRS Duration: 80 QT Interval:  348 QTC Calculation: 432 R Axis:   86 Text Interpretation:  Normal sinus rhythm with sinus arrhythmia  Nonspecific ST abnormality Compared to previous tracing Nonspecific ST  abnormality NOW PRESENT Confirmed by Stevie Kern  MD, Jenny Reichmann ([id number]) on 01/29/2014  10:44:50 PM       Kalman Drape, MD 02/01/14 1336

## 2014-02-01 NOTE — Progress Notes (Signed)
PARENTERAL NUTRITION CONSULT NOTE - INITIAL  Pharmacy Consult for TPN Indication: Duodenal perforation  Allergies  Allergen Reactions  . Penicillins     Some forms of penicillin  . Amoxicillin Rash  . Other Rash    Patient cannot remember name of medication but knows it was an antibiotic used to treat UTI    Patient Measurements: Height: 5\' 4"  (162.6 cm) Weight: 134 lb 4.2 oz (60.9 kg) IBW/kg (Calculated) : 54.7 Adjusted Body Weight: 56.6kg Admit Weight: 60.9kg  Vital Signs: Temp: 97.9 F (36.6 C) (08/24 0740) Temp src: Oral (08/24 0740) BP: 113/56 mmHg (08/24 1000) Pulse Rate: 81 (08/24 1000) Intake/Output from previous day: 08/23 0701 - 08/24 0700 In: 3200 [I.V.:3000; IV Piggyback:200] Out: 1100 [Urine:1100] Intake/Output from this shift: Total I/O In: 575 [I.V.:375; IV Piggyback:200] Out: 200 [Urine:200]  Labs:  Recent Labs  01/30/14 1010 01/30/14 1720 01/31/14 0230  WBC 11.0* 12.5* 11.6*  HGB 9.8* 10.3* 10.3*  HCT 29.0* 30.2* 30.8*  PLT 178 170 163     Recent Labs  01/29/14 2256 01/30/14 1010 01/31/14 0230  NA 139 138 140  K 3.5* 3.9 4.1  CL 102 104 109  CO2 21 23 23   GLUCOSE 135* 156* 170*  BUN 16 12 13   CREATININE 0.58 0.58 0.68  CALCIUM 9.4 8.3* 8.1*  PROT 6.9  --   --   ALBUMIN 3.9  --   --   AST 24  --   --   ALT 17  --   --   ALKPHOS 65  --   --   BILITOT 0.7  --   --    Estimated Creatinine Clearance: 63 ml/min (by C-G formula based on Cr of 0.68).   No results found for this basename: GLUCAP,  in the last 72 hours  Medical History: Past Medical History  Diagnosis Date  . Thyroid nodule 4/09    right  . Osteoporosis     Medications:  Infusions:  . dextrose 5 % and 0.9 % NaCl with KCl 20 mEq/L 125 mL/hr at 02/01/14 0934   Insulin Requirements in the past 24 hours:  None.  Current Nutrition: NPO.  Nutritional Goals:  Pending RD recommednations 1400-1500 kCal, 65-80 grams of protein per day  Assessment: CC:  admitted with diffuse abdominal pain found to have perforated deuodenum  PMH: non-contrib  GI: bowel rest/TPN for perforated deuodenum.   AC: Heparin 5,000 Units q8hrs. CBCs WNL.   ID: Perforated deuodenum. Afebrile, WBC improved 13>11.6 primaxin 8/22>  CV: VVS- no pressors.  Endo: No h/o DM  Lytes: Phos 1.3, Mg 1.9, K 5 (but specimen hemolyzed)  Renal: Cr stable  Pulm: SpO2 93% on RA  Other/PTA meds - prempro, vitamins, claritin  Best practices: ppi, scd  TPN Access: PICC 8/24  TPN day#: 0  Plan:  1. Begin Clinimix E 5/15 at 39ml/hr and will advance to goal as tolerated. 2. Begin 20% lipids at 48ml/hr 3. Na Phos 70mMol x1 4. Decrease MIVF to 23ml/hr when TPN hangs 5. CBGs and SSI q6hrs (emperic)   6. Add trace elements and MVI daily to TPN. 7. TPN Labs. Phos in AM 8. FU RD recommendations.    Etheleen Nicks 02/01/2014,11:05 AM    Sherlon Handing, PharmD, BCPS Clinical pharmacist, pager [phone] 02/01/2014  1:05 PM

## 2014-02-02 LAB — CBC
HCT: 27.7 % — ABNORMAL LOW (ref 36.0–46.0)
Hemoglobin: 9.4 g/dL — ABNORMAL LOW (ref 12.0–15.0)
MCH: 30.8 pg (ref 26.0–34.0)
MCHC: 33.9 g/dL (ref 30.0–36.0)
MCV: 90.8 fL (ref 78.0–100.0)
Platelets: 195 10*3/uL (ref 150–400)
RBC: 3.05 MIL/uL — ABNORMAL LOW (ref 3.87–5.11)
RDW: 13.7 % (ref 11.5–15.5)
WBC: 9.1 10*3/uL (ref 4.0–10.5)

## 2014-02-02 LAB — DIFFERENTIAL
BASOS ABS: 0 10*3/uL (ref 0.0–0.1)
Basophils Relative: 0 % (ref 0–1)
EOS ABS: 0 10*3/uL (ref 0.0–0.7)
Eosinophils Relative: 0 % (ref 0–5)
Lymphocytes Relative: 7 % — ABNORMAL LOW (ref 12–46)
Lymphs Abs: 0.6 10*3/uL — ABNORMAL LOW (ref 0.7–4.0)
MONO ABS: 0.9 10*3/uL (ref 0.1–1.0)
Monocytes Relative: 10 % (ref 3–12)
NEUTROS ABS: 7.5 10*3/uL (ref 1.7–7.7)
NEUTROS PCT: 83 % — AB (ref 43–77)

## 2014-02-02 LAB — PREALBUMIN: Prealbumin: 5.5 mg/dL — ABNORMAL LOW (ref 17.0–34.0)

## 2014-02-02 LAB — COMPREHENSIVE METABOLIC PANEL
ALT: 20 U/L (ref 0–35)
ANION GAP: 9 (ref 5–15)
AST: 31 U/L (ref 0–37)
Albumin: 2.2 g/dL — ABNORMAL LOW (ref 3.5–5.2)
Alkaline Phosphatase: 131 U/L — ABNORMAL HIGH (ref 39–117)
BUN: 9 mg/dL (ref 6–23)
CALCIUM: 7.9 mg/dL — AB (ref 8.4–10.5)
CO2: 22 meq/L (ref 19–32)
Chloride: 112 mEq/L (ref 96–112)
Creatinine, Ser: 0.46 mg/dL — ABNORMAL LOW (ref 0.50–1.10)
GLUCOSE: 141 mg/dL — AB (ref 70–99)
Potassium: 3.3 mEq/L — ABNORMAL LOW (ref 3.7–5.3)
Sodium: 143 mEq/L (ref 137–147)
Total Bilirubin: 0.5 mg/dL (ref 0.3–1.2)
Total Protein: 5.2 g/dL — ABNORMAL LOW (ref 6.0–8.3)

## 2014-02-02 LAB — GLUCOSE, CAPILLARY
GLUCOSE-CAPILLARY: 126 mg/dL — AB (ref 70–99)
GLUCOSE-CAPILLARY: 134 mg/dL — AB (ref 70–99)
GLUCOSE-CAPILLARY: 142 mg/dL — AB (ref 70–99)
Glucose-Capillary: 147 mg/dL — ABNORMAL HIGH (ref 70–99)

## 2014-02-02 LAB — PHOSPHORUS: PHOSPHORUS: 2.1 mg/dL — AB (ref 2.3–4.6)

## 2014-02-02 LAB — MAGNESIUM: MAGNESIUM: 1.8 mg/dL (ref 1.5–2.5)

## 2014-02-02 LAB — TRIGLYCERIDES: Triglycerides: 105 mg/dL (ref <150)

## 2014-02-02 MED ORDER — HYDROMORPHONE HCL PF 1 MG/ML IJ SOLN
0.5000 mg | INTRAMUSCULAR | Status: DC | PRN
  Administered 2014-02-02 – 2014-02-08 (×31): 1 mg via INTRAVENOUS
  Filled 2014-02-02 (×33): qty 1

## 2014-02-02 MED ORDER — POTASSIUM PHOSPHATES 15 MMOLE/5ML IV SOLN
15.0000 mmol | Freq: Once | INTRAVENOUS | Status: AC
  Administered 2014-02-02: 15 mmol via INTRAVENOUS
  Filled 2014-02-02: qty 5

## 2014-02-02 MED ORDER — PROMETHAZINE HCL 25 MG/ML IJ SOLN: 12.5000 mg | Freq: Four times a day (QID) | INTRAMUSCULAR | Status: DC | PRN

## 2014-02-02 MED ORDER — CETYLPYRIDINIUM CHLORIDE 0.05 % MT LIQD
7.0000 mL | Freq: Two times a day (BID) | OROMUCOSAL | Status: DC
  Administered 2014-02-02 – 2014-02-16 (×22): 7 mL via OROMUCOSAL

## 2014-02-02 MED ORDER — FAT EMULSION 20 % IV EMUL
250.0000 mL | INTRAVENOUS | Status: AC
  Administered 2014-02-02: 250 mL via INTRAVENOUS
  Filled 2014-02-02: qty 250

## 2014-02-02 MED ORDER — POTASSIUM CHLORIDE 10 MEQ/100ML IV SOLN
10.0000 meq | INTRAVENOUS | Status: AC
  Administered 2014-02-02 (×4): 10 meq via INTRAVENOUS
  Filled 2014-02-02 (×4): qty 100

## 2014-02-02 MED ORDER — TRACE MINERALS CR-CU-F-FE-I-MN-MO-SE-ZN IV SOLN
INTRAVENOUS | Status: AC
  Administered 2014-02-02: 18:00:00 via INTRAVENOUS
  Filled 2014-02-02: qty 2000

## 2014-02-02 MED ORDER — CHLORHEXIDINE GLUCONATE 0.12 % MT SOLN
15.0000 mL | Freq: Two times a day (BID) | OROMUCOSAL | Status: DC
  Administered 2014-02-02 – 2014-02-16 (×27): 15 mL via OROMUCOSAL
  Filled 2014-02-02 (×24): qty 15

## 2014-02-02 MED ORDER — WHITE PETROLATUM GEL
Status: AC
  Administered 2014-02-02: 12:00:00
  Filled 2014-02-02: qty 5

## 2014-02-02 NOTE — Progress Notes (Signed)
Patient ID: Brianna Fuller, female   DOB: Jul 28, 1950, 63 y.o.   MRN: [id number]     [facility]      [address] [place], [place] [address]    Phone: [phone] FAX: [phone]     Subjective: Nausea with pain meds.  Now has a dull aching pain compared to sharp severe pain in previous days.  Afebrile.  WBC normal.    Objective:  Vital signs:  Filed Vitals:   02/01/14 1300 02/01/14 1528 02/01/14 2119 02/02/14 0446  BP:  113/70 118/65 108/56  Pulse: 83 80 95 85  Temp:  97.6 F (36.4 C) 98.1 F (36.7 C) 98.6 F (37 C)  TempSrc:  Oral Oral Oral  Resp: _0 Height:  5' 4" (1.626 m)    Weight:  126 lb 8 oz (57.38 kg)    SpO2: 99% 99% 98% 97%    Last BM Date: 01/29/14  Intake/Output   Yesterday:  08/24 0701 - 08/25 0700 In: 3452.6 [I.V.:2214.6; IV Piggyback:758.3; TPN:479.7] Out: 900 [Urine:900] This shift:    I/O last 3 completed shifts: In: 4952.6 [I.V.:3714.6; IV Piggyback:758.3] Out: 1250 [Urine:1250]    Physical Exam: General: Pt awake/alert/oriented x4 in no acute distress Chest: cta. No chest wall pain w good excursion CV:  Pulses intact.  Regular rhythm MS: Normal AROM mjr joints.  No obvious deformity Abdomen: Soft.  Nondistended.  Non tender.  No evidence of peritonitis.  No incarcerated hernias. Ext:  SCDs BLE.  No mjr edema.  No cyanosis Skin: No petechiae / purpura   Problem List:   Active Problems:   Duodenal perforation    Results:   Labs: Results for orders placed during the hospital encounter of 01/29/14 (from the past 48 hour(s))  BASIC METABOLIC PANEL     Status: Abnormal   Collection Time    02/01/14  9:49 AM      Result Value Ref Range   Sodium 144  137 - 147 mEq/L   Potassium 5.0  3.7 - 5.3 mEq/L   Comment: SLIGHT HEMOLYSIS   Chloride 114 (*) 96 - 112 mEq/L   CO2 18 (*) 19 - 32 mEq/L   Glucose, Bld 135 (*) 70 - 99 mg/dL   BUN 10  6 - 23 mg/dL   Creatinine, Ser 0.46  (*) 0.50 - 1.10 mg/dL   Calcium 8.6  8.4 - 10.5 mg/dL   GFR calc non Af Amer >90  >90 mL/min   GFR calc Af Amer >90  >90 mL/min   Comment: (NOTE)     The eGFR has been calculated using the CKD EPI equation.     This calculation has not been validated in all clinical situations.     eGFR's persistently <90 mL/min signify possible Chronic Kidney     Disease.   Anion gap 12  5 - 15  MAGNESIUM     Status: None   Collection Time    02/01/14  9:49 AM      Result Value Ref Range   Magnesium 1.9  1.5 - 2.5 mg/dL  PHOSPHORUS     Status: Abnormal   Collection Time    02/01/14  9:49 AM      Result Value Ref Range   Phosphorus 1.3 (*) 2.3 - 4.6 mg/dL  GLUCOSE, CAPILLARY     Status: Abnormal   Collection Time    02/01/14  5:58 PM      Result Value  Ref Range   Glucose-Capillary 131 (*) 70 - 99 mg/dL  GLUCOSE, CAPILLARY     Status: Abnormal   Collection Time    02/02/14 12:13 AM      Result Value Ref Range   Glucose-Capillary 134 (*) 70 - 99 mg/dL  COMPREHENSIVE METABOLIC PANEL     Status: Abnormal   Collection Time    02/02/14  5:12 AM      Result Value Ref Range   Sodium 143  137 - 147 mEq/L   Potassium 3.3 (*) 3.7 - 5.3 mEq/L   Comment: DELTA CHECK NOTED   Chloride 112  96 - 112 mEq/L   CO2 22  19 - 32 mEq/L   Glucose, Bld 141 (*) 70 - 99 mg/dL   BUN 9  6 - 23 mg/dL   Creatinine, Ser 0.46 (*) 0.50 - 1.10 mg/dL   Calcium 7.9 (*) 8.4 - 10.5 mg/dL   Total Protein 5.2 (*) 6.0 - 8.3 g/dL   Albumin 2.2 (*) 3.5 - 5.2 g/dL   AST 31  0 - 37 U/L   ALT 20  0 - 35 U/L   Alkaline Phosphatase 131 (*) 39 - 117 U/L   Total Bilirubin 0.5  0.3 - 1.2 mg/dL   GFR calc non Af Amer >90  >90 mL/min   GFR calc Af Amer >90  >90 mL/min   Comment: (NOTE)     The eGFR has been calculated using the CKD EPI equation.     This calculation has not been validated in all clinical situations.     eGFR's persistently <90 mL/min signify possible Chronic Kidney     Disease.   Anion gap 9  5 - 15  MAGNESIUM      Status: None   Collection Time    02/02/14  5:12 AM      Result Value Ref Range   Magnesium 1.8  1.5 - 2.5 mg/dL  PHOSPHORUS     Status: Abnormal   Collection Time    02/02/14  5:12 AM      Result Value Ref Range   Phosphorus 2.1 (*) 2.3 - 4.6 mg/dL  CBC     Status: Abnormal   Collection Time    02/02/14  5:12 AM      Result Value Ref Range   WBC 9.1  4.0 - 10.5 K/uL   RBC 3.05 (*) 3.87 - 5.11 MIL/uL   Hemoglobin 9.4 (*) 12.0 - 15.0 g/dL   HCT 27.7 (*) 36.0 - 46.0 %   MCV 90.8  78.0 - 100.0 fL   MCH 30.8  26.0 - 34.0 pg   MCHC 33.9  30.0 - 36.0 g/dL   RDW 13.7  11.5 - 15.5 %   Platelets 195  150 - 400 K/uL  DIFFERENTIAL     Status: Abnormal   Collection Time    02/02/14  5:12 AM      Result Value Ref Range   Neutrophils Relative % 83 (*) 43 - 77 %   Neutro Abs 7.5  1.7 - 7.7 K/uL   Lymphocytes Relative 7 (*) 12 - 46 %   Lymphs Abs 0.6 (*) 0.7 - 4.0 K/uL   Monocytes Relative 10  3 - 12 %   Monocytes Absolute 0.9  0.1 - 1.0 K/uL   Eosinophils Relative 0  0 - 5 %   Eosinophils Absolute 0.0  0.0 - 0.7 K/uL   Basophils Relative 0  0 - 1 %   Basophils  Absolute 0.0  0.0 - 0.1 K/uL  TRIGLYCERIDES     Status: None   Collection Time    02/02/14  5:12 AM      Result Value Ref Range   Triglycerides 105  <150 mg/dL  GLUCOSE, CAPILLARY     Status: Abnormal   Collection Time    02/02/14  6:40 AM      Result Value Ref Range   Glucose-Capillary 147 (*) 70 - 99 mg/dL    Imaging / Studies: No results found.  Medications / Allergies:  Scheduled Meds: . heparin subcutaneous  5,000 Units Subcutaneous 3 times per day  . imipenem-cilastatin  250 mg Intravenous Q6H  . insulin aspart  0-9 Units Subcutaneous 4 times per day  . pantoprazole (PROTONIX) IV  40 mg Intravenous Q12H  . potassium chloride  10 mEq Intravenous Q1 Hr x 4  . sodium chloride  10-40 mL Intracatheter Q12H   Continuous Infusions: . dextrose 5 % and 0.9% NaCl 125 mL/hr at 02/01/14 2302  . Marland KitchenTPN (CLINIMIX-E) Adult  30 mL/hr at 02/01/14 1802   And  . fat emulsion 250 mL (02/01/14 1802)   PRN Meds:.acetaminophen, HYDROmorphone (DILAUDID) injection, ondansetron, promethazine, sodium chloride  Antibiotics: Anti-infectives   Start     Dose/Rate Route Frequency Ordered Stop   01/30/14 0800  imipenem-cilastatin (PRIMAXIN) 250 mg in sodium chloride 0.9 % 100 mL IVPB     250 mg 200 mL/hr over 30 Minutes Intravenous Every 6 hours 01/30/14 0740          Assessment/Plan Retroperitoneal duodenal perforation  -continue with bowel rest -Invanz -TPN -supplement K, repeat BMP in AM -mobilize/IS -add phenergan(pt waiting 6 hours for pain meds d/t nausea associated with dilaudid) -SCD/heparin -plan for swallow eval to check for a leak in a few day  Erby Pian, [facility] Pager [phone](7A-4:30P) For consults and floor pages call [phone](7A-4:30P)  02/02/2014 8:19 AM

## 2014-02-02 NOTE — Progress Notes (Signed)
On TNA, feeling better. Temp improving. Continue ABX and bowel rest. Patient examined and I agree with the assessment and plan  Georganna Skeans, MD, MPH, FACS Trauma: [phone] General Surgery: [phone]  02/02/2014 11:22 AM

## 2014-02-02 NOTE — Progress Notes (Addendum)
PARENTERAL NUTRITION/ANTIBIOTIC CONSULT NOTE - Follow-up  Pharmacy Consult for TPN and Primaxin Indication: Duodenal perforation  Allergies  Allergen Reactions  . Penicillins     Some forms of penicillin  . Amoxicillin Rash  . Other Rash    Patient cannot remember name of medication but knows it was an antibiotic used to treat UTI    Patient Measurements: Height: '5\' 4"'  (162.6 cm) Weight: 126 lb 8 oz (57.38 kg) IBW/kg (Calculated) : 54.7 Adjusted Body Weight: 56.6kg Usual Weight: 58 kg  Vital Signs: Temp: 98.6 F (37 C) (08/25 0446) Temp src: Oral (08/25 0446) BP: 108/56 mmHg (08/25 0446) Pulse Rate: 85 (08/25 0446) Intake/Output from previous day: 08/24 0701 - 08/25 0700 In: 3452.6 [I.V.:2214.6; IV Piggyback:758.3; TPN:479.7] Out: 900 [Urine:900] Intake/Output from this shift:    Labs:  Recent Labs  01/30/14 1720 01/31/14 0230 02/02/14 0512  WBC 12.5* 11.6* 9.1  HGB 10.3* 10.3* 9.4*  HCT 30.2* 30.8* 27.7*  PLT 170 163 195     Recent Labs  01/31/14 0230 02/01/14 0949 02/02/14 0512  NA 140 144 143  K 4.1 5.0 3.3*  CL 109 114* 112  CO2 23 18* 22  GLUCOSE 170* 135* 141*  BUN '13 10 9  ' CREATININE 0.68 0.46* 0.46*  CALCIUM 8.1* 8.6 7.9*  MG  --  1.9 1.8  PHOS  --  1.3* 2.1*  PROT  --   --  5.2*  ALBUMIN  --   --  2.2*  AST  --   --  31  ALT  --   --  20  ALKPHOS  --   --  131*  BILITOT  --   --  0.5  TRIG  --   --  105   Estimated Creatinine Clearance: 63 ml/min (by C-G formula based on Cr of 0.46).    Recent Labs  02/01/14 1758 02/02/14 0013 02/02/14 0640  GLUCAP 131* 134* 147*    Medical History: Past Medical History  Diagnosis Date  . Thyroid nodule 4/09    right  . Osteoporosis   . Hypertension     Medications:  Infusions:  . dextrose 5 % and 0.9% NaCl 125 mL/hr at 02/01/14 2302  . Marland KitchenTPN (CLINIMIX-E) Adult 30 mL/hr at 02/01/14 1802   And  . fat emulsion 250 mL (02/01/14 1802)   Insulin Requirements in the past 24 hours:  2  units Novolog  Current Nutrition: NPO Clinimix E 5/15 at 30 ml/hr and 20% lipid emulsion at 72m/hr provides 991 kcal and 36 gm protein Goal: Clinimix E 5/15 at 65 ml/hr and 20% lipid emulsion at 175mhr will provide 1588 kcal and 78 gm protein  Nutritional Goals:  1500-1700 kCal, 70-80 grams of protein per day (per RD note 8/24)  Assessment: Admit: Admitted with diffuse abdominal pain found to have perforated duodenum. No significant PMH  GI: Bowel rest/TPN for retroperitoneal duodenal perforation. Plan for swallow eval in a few days to check for leak.   Endo: No h/o DM. Empiric sensitive SSI/CBGs and plan to d/c once at goal rate if SSI requirements remain minimal.  Lytes: K 3.3 (MD replaced), Phos 2.1, Mg 1.8.  Renal: SCr stable, UOP 0.7 ml/kg/hr. MIVF: D5NS at 125 ml/hr. Positive 1.3L yesterday.  Pulm: RA  Cards: No hx. VSS  Hepatobil: Alk phos slightly elevated, other LFTs wnl. TG 105  Neuro: A&O  ID: Primaxin D#4 for perforated duodenum. Afeb. Wbc down to nl.     Best Practices: SQ heparin, PPI IV  TPN Access: PICC 8/24  TPN day#: 1  Plan:  1. Increase Clinimix E 5/15 to 92m/hr and continue 20% lipid emulsion at 185mhr. Will advance to goal slowly with low K and Phos. 2. Decrease MIVF (D5NS) to 75 ml/hr.  3. KPhos 15 mmol IV today 4. Trace elements and MVI daily to TPN. 5. F/u prealbumin, BMET, phos in a.m. 6. Continue Primaxin 25036mV q6h. 7. Will continue to follow renal function, pt's clinical condition, LOT with abx  CarSherlon HandingharmD, BCPS Clinical pharmacist, pager [phone]/2015,8:29 AM

## 2014-02-03 DIAGNOSIS — E876 Hypokalemia: Secondary | ICD-10-CM

## 2014-02-03 LAB — CBC
HEMATOCRIT: 27.1 % — AB (ref 36.0–46.0)
HEMOGLOBIN: 8.9 g/dL — AB (ref 12.0–15.0)
MCH: 29.5 pg (ref 26.0–34.0)
MCHC: 32.8 g/dL (ref 30.0–36.0)
MCV: 89.7 fL (ref 78.0–100.0)
Platelets: 212 10*3/uL (ref 150–400)
RBC: 3.02 MIL/uL — AB (ref 3.87–5.11)
RDW: 13.9 % (ref 11.5–15.5)
WBC: 7.3 10*3/uL (ref 4.0–10.5)

## 2014-02-03 LAB — BASIC METABOLIC PANEL
Anion gap: 12 (ref 5–15)
BUN: 11 mg/dL (ref 6–23)
CHLORIDE: 111 meq/L (ref 96–112)
CO2: 21 mEq/L (ref 19–32)
Calcium: 8.1 mg/dL — ABNORMAL LOW (ref 8.4–10.5)
Creatinine, Ser: 0.44 mg/dL — ABNORMAL LOW (ref 0.50–1.10)
GFR calc Af Amer: >90 mL/min (ref >90)
GFR calc non Af Amer: >90 mL/min (ref >90)
GLUCOSE: 128 mg/dL — AB (ref 70–99)
POTASSIUM: 3.3 meq/L — AB (ref 3.7–5.3)
Sodium: 144 mEq/L (ref 137–147)

## 2014-02-03 LAB — GLUCOSE, CAPILLARY
GLUCOSE-CAPILLARY: 128 mg/dL — AB (ref 70–99)
GLUCOSE-CAPILLARY: 125 mg/dL — AB (ref 70–99)
Glucose-Capillary: 122 mg/dL — ABNORMAL HIGH (ref 70–99)
Glucose-Capillary: 123 mg/dL — ABNORMAL HIGH (ref 70–99)
Glucose-Capillary: 130 mg/dL — ABNORMAL HIGH (ref 70–99)

## 2014-02-03 LAB — PHOSPHORUS: PHOSPHORUS: 2.9 mg/dL (ref 2.3–4.6)

## 2014-02-03 MED ORDER — TRACE MINERALS CR-CU-F-FE-I-MN-MO-SE-ZN IV SOLN
INTRAVENOUS | Status: AC
  Administered 2014-02-03 – 2014-02-04 (×2): via INTRAVENOUS
  Filled 2014-02-03: qty 2000

## 2014-02-03 MED ORDER — KCL IN DEXTROSE-NACL 20-5-0.9 MEQ/L-%-% IV SOLN
INTRAVENOUS | Status: DC
  Filled 2014-02-03 (×2): qty 1000

## 2014-02-03 MED ORDER — POTASSIUM CHLORIDE 10 MEQ/50ML IV SOLN
10.0000 meq | INTRAVENOUS | Status: AC
  Administered 2014-02-03 (×6): 10 meq via INTRAVENOUS
  Filled 2014-02-03 (×6): qty 50

## 2014-02-03 MED ORDER — FAT EMULSION 20 % IV EMUL
250.0000 mL | INTRAVENOUS | Status: AC
  Administered 2014-02-03 – 2014-02-04 (×2): 250 mL via INTRAVENOUS
  Filled 2014-02-03: qty 250

## 2014-02-03 NOTE — Progress Notes (Signed)
Patient ID: Brianna Fuller, female   DOB: Apr 29, 1951, 63 y.o.   MRN: [id number]     [facility]      [address] [place], [place] [address]    Phone: [phone] FAX: [phone]     Subjective: No n/v.  Passing flatus.  No sob, cp.  Voiding.  Ambulating in hallways.  Less pain. Afebrile.  VSS.  WBC remains normal.    Objective:  Vital signs:  Filed Vitals:   02/02/14 0446 02/02/14 0800 02/02/14 2035 02/03/14 0604  BP: 108/56  136/59 112/63  Pulse: 85  86 87  Temp: 98.6 F (37 C)  100 F (37.8 C) 99.1 F (37.3 C)  TempSrc: Oral  Oral Oral  Resp: '20  22 20  ' Height:      Weight:  139 lb (63.05 kg)    SpO2: 97%  98% 95%    Last BM Date: 02/03/14  Intake/Output   Yesterday:  08/25 0701 - 08/26 0700 In: 3216.7 [I.V.:1741.7; IV Piggyback:455; TPN:1020] Out: 551 [Urine:550; Stool:1] This shift:    I/O last 3 completed shifts: In: 5392.2 [I.V.:3237.5; IV Piggyback:655] Out: 6010 [Urine:1050; Stool:1]    Physical Exam:  General: Pt awake/alert/oriented x4 in no acute distress  Chest: cta. No chest wall pain w good excursion  CV: Pulses intact. Regular rhythm  MS: Normal AROM mjr joints. No obvious deformity  Abdomen: Soft. Nondistended. Non tender. No evidence of peritonitis. No incarcerated hernias.  Ext: SCDs BLE. No mjr edema. No cyanosis  Skin: No petechiae / purpura    Problem List:   Active Problems:   Duodenal perforation    Results:   Labs: Results for orders placed during the hospital encounter of 01/29/14 (from the past 48 hour(s))  BASIC METABOLIC PANEL     Status: Abnormal   Collection Time    02/01/14  9:49 AM      Result Value Ref Range   Sodium 144  137 - 147 mEq/L   Potassium 5.0  3.7 - 5.3 mEq/L   Comment: SLIGHT HEMOLYSIS   Chloride 114 (*) 96 - 112 mEq/L   CO2 18 (*) 19 - 32 mEq/L   Glucose, Bld 135 (*) 70 - 99 mg/dL   BUN 10  6 - 23 mg/dL   Creatinine, Ser 0.46 (*) 0.50 - 1.10  mg/dL   Calcium 8.6  8.4 - 10.5 mg/dL   GFR calc non Af Amer >90  >90 mL/min   GFR calc Af Amer >90  >90 mL/min   Comment: (NOTE)     The eGFR has been calculated using the CKD EPI equation.     This calculation has not been validated in all clinical situations.     eGFR's persistently <90 mL/min signify possible Chronic Kidney     Disease.   Anion gap 12  5 - 15  MAGNESIUM     Status: None   Collection Time    02/01/14  9:49 AM      Result Value Ref Range   Magnesium 1.9  1.5 - 2.5 mg/dL  PHOSPHORUS     Status: Abnormal   Collection Time    02/01/14  9:49 AM      Result Value Ref Range   Phosphorus 1.3 (*) 2.3 - 4.6 mg/dL  GLUCOSE, CAPILLARY     Status: Abnormal   Collection Time    02/01/14  5:58 PM      Result Value Ref Range  Glucose-Capillary 131 (*) 70 - 99 mg/dL  GLUCOSE, CAPILLARY     Status: Abnormal   Collection Time    02/02/14 12:13 AM      Result Value Ref Range   Glucose-Capillary 134 (*) 70 - 99 mg/dL  COMPREHENSIVE METABOLIC PANEL     Status: Abnormal   Collection Time    02/02/14  5:12 AM      Result Value Ref Range   Sodium 143  137 - 147 mEq/L   Potassium 3.3 (*) 3.7 - 5.3 mEq/L   Comment: DELTA CHECK NOTED   Chloride 112  96 - 112 mEq/L   CO2 22  19 - 32 mEq/L   Glucose, Bld 141 (*) 70 - 99 mg/dL   BUN 9  6 - 23 mg/dL   Creatinine, Ser 0.46 (*) 0.50 - 1.10 mg/dL   Calcium 7.9 (*) 8.4 - 10.5 mg/dL   Total Protein 5.2 (*) 6.0 - 8.3 g/dL   Albumin 2.2 (*) 3.5 - 5.2 g/dL   AST 31  0 - 37 U/L   ALT 20  0 - 35 U/L   Alkaline Phosphatase 131 (*) 39 - 117 U/L   Total Bilirubin 0.5  0.3 - 1.2 mg/dL   GFR calc non Af Amer >90  >90 mL/min   GFR calc Af Amer >90  >90 mL/min   Comment: (NOTE)     The eGFR has been calculated using the CKD EPI equation.     This calculation has not been validated in all clinical situations.     eGFR's persistently <90 mL/min signify possible Chronic Kidney     Disease.   Anion gap 9  5 - 15  PREALBUMIN     Status:  Abnormal   Collection Time    02/02/14  5:12 AM      Result Value Ref Range   Prealbumin 5.5 (*) 17.0 - 34.0 mg/dL   Comment: Performed at [facility]     Status: None   Collection Time    02/02/14  5:12 AM      Result Value Ref Range   Magnesium 1.8  1.5 - 2.5 mg/dL  PHOSPHORUS     Status: Abnormal   Collection Time    02/02/14  5:12 AM      Result Value Ref Range   Phosphorus 2.1 (*) 2.3 - 4.6 mg/dL  CBC     Status: Abnormal   Collection Time    02/02/14  5:12 AM      Result Value Ref Range   WBC 9.1  4.0 - 10.5 K/uL   RBC 3.05 (*) 3.87 - 5.11 MIL/uL   Hemoglobin 9.4 (*) 12.0 - 15.0 g/dL   HCT 27.7 (*) 36.0 - 46.0 %   MCV 90.8  78.0 - 100.0 fL   MCH 30.8  26.0 - 34.0 pg   MCHC 33.9  30.0 - 36.0 g/dL   RDW 13.7  11.5 - 15.5 %   Platelets 195  150 - 400 K/uL  DIFFERENTIAL     Status: Abnormal   Collection Time    02/02/14  5:12 AM      Result Value Ref Range   Neutrophils Relative % 83 (*) 43 - 77 %   Neutro Abs 7.5  1.7 - 7.7 K/uL   Lymphocytes Relative 7 (*) 12 - 46 %   Lymphs Abs 0.6 (*) 0.7 - 4.0 K/uL   Monocytes Relative 10  3 - 12 %  Monocytes Absolute 0.9  0.1 - 1.0 K/uL   Eosinophils Relative 0  0 - 5 %   Eosinophils Absolute 0.0  0.0 - 0.7 K/uL   Basophils Relative 0  0 - 1 %   Basophils Absolute 0.0  0.0 - 0.1 K/uL  TRIGLYCERIDES     Status: None   Collection Time    02/02/14  5:12 AM      Result Value Ref Range   Triglycerides 105  <150 mg/dL  GLUCOSE, CAPILLARY     Status: Abnormal   Collection Time    02/02/14  6:40 AM      Result Value Ref Range   Glucose-Capillary 147 (*) 70 - 99 mg/dL  GLUCOSE, CAPILLARY     Status: Abnormal   Collection Time    02/02/14  1:48 PM      Result Value Ref Range   Glucose-Capillary 126 (*) 70 - 99 mg/dL  GLUCOSE, CAPILLARY     Status: Abnormal   Collection Time    02/03/14 12:00 AM      Result Value Ref Range   Glucose-Capillary 142 (*) 70 - 99 mg/dL  CBC     Status: Abnormal   Collection  Time    02/03/14  5:30 AM      Result Value Ref Range   WBC 7.3  4.0 - 10.5 K/uL   RBC 3.02 (*) 3.87 - 5.11 MIL/uL   Hemoglobin 8.9 (*) 12.0 - 15.0 g/dL   HCT 27.1 (*) 36.0 - 46.0 %   MCV 89.7  78.0 - 100.0 fL   MCH 29.5  26.0 - 34.0 pg   MCHC 32.8  30.0 - 36.0 g/dL   RDW 13.9  11.5 - 15.5 %   Platelets 212  150 - 400 K/uL  BASIC METABOLIC PANEL     Status: Abnormal   Collection Time    02/03/14  5:30 AM      Result Value Ref Range   Sodium 144  137 - 147 mEq/L   Potassium 3.3 (*) 3.7 - 5.3 mEq/L   Chloride 111  96 - 112 mEq/L   CO2 21  19 - 32 mEq/L   Glucose, Bld 128 (*) 70 - 99 mg/dL   BUN 11  6 - 23 mg/dL   Creatinine, Ser 0.44 (*) 0.50 - 1.10 mg/dL   Calcium 8.1 (*) 8.4 - 10.5 mg/dL   GFR calc non Af Amer >90  >90 mL/min   GFR calc Af Amer >90  >90 mL/min   Comment: (NOTE)     The eGFR has been calculated using the CKD EPI equation.     This calculation has not been validated in all clinical situations.     eGFR's persistently <90 mL/min signify possible Chronic Kidney     Disease.   Anion gap 12  5 - 15  PHOSPHORUS     Status: None   Collection Time    02/03/14  5:30 AM      Result Value Ref Range   Phosphorus 2.9  2.3 - 4.6 mg/dL  GLUCOSE, CAPILLARY     Status: Abnormal   Collection Time    02/03/14  6:06 AM      Result Value Ref Range   Glucose-Capillary 128 (*) 70 - 99 mg/dL    Imaging / Studies: No results found.  Medications / Allergies:  Scheduled Meds: . antiseptic oral rinse  7 mL Mouth Rinse q12n4p  . chlorhexidine  15 mL Mouth Rinse BID  .  heparin subcutaneous  5,000 Units Subcutaneous 3 times per day  . imipenem-cilastatin  250 mg Intravenous Q6H  . insulin aspart  0-9 Units Subcutaneous 4 times per day  . pantoprazole (PROTONIX) IV  40 mg Intravenous Q12H  . sodium chloride  10-40 mL Intracatheter Q12H   Continuous Infusions: . dextrose 5 % and 0.9% NaCl 75 mL/hr at 02/02/14 2229  . Marland KitchenTPN (CLINIMIX-E) Adult 50 mL/hr at 02/02/14 1748   And   . fat emulsion 250 mL (02/02/14 1747)   PRN Meds:.acetaminophen, HYDROmorphone (DILAUDID) injection, ondansetron, promethazine, sodium chloride  Antibiotics: Anti-infectives   Start     Dose/Rate Route Frequency Ordered Stop   01/30/14 0800  imipenem-cilastatin (PRIMAXIN) 250 mg in sodium chloride 0.9 % 100 mL IVPB     250 mg 200 mL/hr over 30 Minutes Intravenous Every 6 hours 01/30/14 0740        Assessment/Plan Retroperitoneal duodenal perforation  -continue with bowel rest  -Invanz  -TPN  -mobilize/IS  -pain control/anti-emetics -SCD/heparin  -CT of abd/pelvis with oral contrast on Friday to evaluate for a leak Hypokalemia-supplement per pharmacy    Erby Pian, [facility] Pager [phone](7A-4:30P) For consults and floor pages call [phone](7A-4:30P)  02/03/2014 7:59 AM

## 2014-02-03 NOTE — Progress Notes (Signed)
PARENTERAL NUTRITION CONSULT NOTE - Follow-up  Pharmacy Consult for TPN  Indication: Duodenal perforation  Allergies  Allergen Reactions  . Penicillins     Some forms of penicillin  . Amoxicillin Rash  . Other Rash    Patient cannot remember name of medication but knows it was an antibiotic used to treat UTI    Patient Measurements: Height: _0  (162.6 cm) Weight: 139 lb (63.05 kg) (139 lb) IBW/kg (Calculated) : 54.7 Adjusted Body Weight: 56.6kg Usual Weight: 58 kg  Vital Signs: Temp: 99.1 F (37.3 C) (08/26 0604) Temp src: Oral (08/26 0604) BP: 112/63 mmHg (08/26 0604) Pulse Rate: 87 (08/26 0604) Intake/Output from previous day: 08/25 0701 - 08/26 0700 In: 3216.7 [I.V.:1741.7; IV Piggyback:455; TPN:1020] Out: 551 [Urine:550; Stool:1] Intake/Output from this shift:    Labs:  Recent Labs  02/02/14 0512 02/03/14 0530  WBC 9.1 7.3  HGB 9.4* 8.9*  HCT 27.7* 27.1*  PLT 195 212     Recent Labs  02/01/14 0949 02/02/14 0512 02/03/14 0530  NA 144 143 144  K 5.0 3.3* 3.3*  CL 114* 112 111  CO2 18* 22 21  GLUCOSE 135* 141* 128*  BUN _1 CREATININE 0.46* 0.46* 0.44*  CALCIUM 8.6 7.9* 8.1*  MG 1.9 1.8  --   PHOS 1.3* 2.1* 2.9  PROT  --  5.2*  --   ALBUMIN  --  2.2*  --   AST  --  31  --   ALT  --  20  --   ALKPHOS  --  131*  --   BILITOT  --  0.5  --   PREALBUMIN  --  5.5*  --   TRIG  --  105  --    Estimated Creatinine Clearance: 63 ml/min (by C-G formula based on Cr of 0.44).    Recent Labs  02/02/14 1348 02/03/14 02/03/14 0606  GLUCAP 126* 142* 128*   Insulin Requirements in the past 24 hours:  3 units Novolog  Current Nutrition: NPO Clinimix E 5/15 at 50 ml/hr and 20% lipid emulsion at 61m/hr provides 1332 kcal and 60 gm protein Goal: Clinimix E 5/15 at 65 ml/hr and 20% lipid emulsion at 137mhr will provide 1588 kcal and 78 gm protein  Nutritional Goals:  1500-1700 kCal, 70-80 grams of protein per day (per RD note  8/24)  Assessment: Admit: Admitted with diffuse abdominal pain found to have perforated duodenum. No significant PMH.  GI: Bowel rest/TPN for retroperitoneal duodenal perforation. +flatus. CT on Friday to evaluate for leak. Prealbumin 5.5 (low).   Endo: No h/o DM. Empiric sensitive SSI/CBGs and plan to d/c once at goal rate if SSI requirements remain minimal.  Lytes: K 3.3, Phos 2.9, Mg 1.8, Corrected Ca wnl.  Renal: SCr stable. MIVF: D5NS at 75 ml/hr. UOP not charted accurately.  Pulm: RA  Cards: No hx. VSS  Hepatobil: Alk phos slightly elevated, other LFTs wnl. TG 105  Neuro: A&O  ID: Primaxin D#5 for perforated duodenum. Tm 100. Wbc wnl.  Hem/Onc: Hgb low but stable, plt ok.     Best Practices: SQ heparin, PPI IV  TPN Access: PICC 8/24  TPN day#: 2  Plan:  1. Increase Clinimix E 5/15 to 6579mr and continue 20% lipid emulsion at 68m5m. This meets 100% pt's estimated needs. 2. Change MIVF to D5NS with 20mE54mL/liter to 50 ml/hr.  3. KCl 10 mEq/50ml 2m4. Trace elements and MVI daily to TPN. 5. F/u TPN labs in  a.m.  Sherlon Handing, PharmD, BCPS Clinical pharmacist, pager [phone] 02/03/2014,8:46 AM

## 2014-02-03 NOTE — Progress Notes (Signed)
Pt looks and feels well. Restudy Friday.  Cont TNA.  EXAM BENIGN

## 2014-02-04 DIAGNOSIS — E8779 Other fluid overload: Secondary | ICD-10-CM

## 2014-02-04 LAB — COMPREHENSIVE METABOLIC PANEL
ALT: 28 U/L (ref 0–35)
AST: 32 U/L (ref 0–37)
Albumin: 2.3 g/dL — ABNORMAL LOW (ref 3.5–5.2)
Alkaline Phosphatase: 166 U/L — ABNORMAL HIGH (ref 39–117)
Anion gap: 12 (ref 5–15)
BUN: 11 mg/dL (ref 6–23)
CALCIUM: 8.2 mg/dL — AB (ref 8.4–10.5)
CO2: 24 mEq/L (ref 19–32)
Chloride: 105 mEq/L (ref 96–112)
Creatinine, Ser: 0.46 mg/dL — ABNORMAL LOW (ref 0.50–1.10)
GFR calc non Af Amer: >90 mL/min (ref >90)
GLUCOSE: 106 mg/dL — AB (ref 70–99)
Potassium: 3.6 mEq/L — ABNORMAL LOW (ref 3.7–5.3)
Sodium: 141 mEq/L (ref 137–147)
TOTAL PROTEIN: 5.6 g/dL — AB (ref 6.0–8.3)
Total Bilirubin: 0.6 mg/dL (ref 0.3–1.2)

## 2014-02-04 LAB — PRO B NATRIURETIC PEPTIDE: Pro B Natriuretic peptide (BNP): 467.6 pg/mL — ABNORMAL HIGH (ref 0–125)

## 2014-02-04 LAB — PHOSPHORUS: PHOSPHORUS: 3.2 mg/dL (ref 2.3–4.6)

## 2014-02-04 LAB — GLUCOSE, CAPILLARY
GLUCOSE-CAPILLARY: 107 mg/dL — AB (ref 70–99)
Glucose-Capillary: 111 mg/dL — ABNORMAL HIGH (ref 70–99)
Glucose-Capillary: 118 mg/dL — ABNORMAL HIGH (ref 70–99)
Glucose-Capillary: 125 mg/dL — ABNORMAL HIGH (ref 70–99)

## 2014-02-04 LAB — MAGNESIUM: MAGNESIUM: 2.1 mg/dL (ref 1.5–2.5)

## 2014-02-04 MED ORDER — POTASSIUM CHLORIDE 10 MEQ/50ML IV SOLN
10.0000 meq | INTRAVENOUS | Status: AC
  Administered 2014-02-04 (×2): 10 meq via INTRAVENOUS
  Filled 2014-02-04 (×4): qty 50

## 2014-02-04 MED ORDER — TRACE MINERALS CR-CU-F-FE-I-MN-MO-SE-ZN IV SOLN
65.0000 mL/h | INTRAVENOUS | Status: DC
  Filled 2014-02-04: qty 2000

## 2014-02-04 MED ORDER — FAT EMULSION 20 % IV EMUL
250.0000 mL | INTRAVENOUS | Status: DC
  Filled 2014-02-04: qty 250

## 2014-02-04 MED ORDER — FUROSEMIDE 10 MG/ML IJ SOLN
40.0000 mg | Freq: Once | INTRAMUSCULAR | Status: AC
  Administered 2014-02-04: 40 mg via INTRAVENOUS
  Filled 2014-02-04: qty 4

## 2014-02-04 MED ORDER — KCL IN DEXTROSE-NACL 40-5-0.9 MEQ/L-%-% IV SOLN
INTRAVENOUS | Status: DC
  Administered 2014-02-04 – 2014-02-08 (×3): via INTRAVENOUS
  Filled 2014-02-04 (×7): qty 1000

## 2014-02-04 MED ORDER — POTASSIUM CHLORIDE 10 MEQ/100ML IV SOLN: 10.0000 meq | INTRAVENOUS | Status: DC

## 2014-02-04 MED ORDER — POTASSIUM CHLORIDE 10 MEQ/50ML IV SOLN
10.0000 meq | INTRAVENOUS | Status: AC
  Administered 2014-02-04 (×2): 10 meq via INTRAVENOUS
  Filled 2014-02-04 (×2): qty 50

## 2014-02-04 MED ORDER — FAT EMULSION 20 % IV EMUL
250.0000 mL | INTRAVENOUS | Status: AC
  Filled 2014-02-04: qty 250

## 2014-02-04 MED ORDER — TRACE MINERALS CR-CU-F-FE-I-MN-MO-SE-ZN IV SOLN
65.0000 mL/h | INTRAVENOUS | Status: AC
  Filled 2014-02-04: qty 2000

## 2014-02-04 NOTE — Progress Notes (Signed)
PARENTERAL NUTRITION CONSULT NOTE - Follow-up  Pharmacy Consult for TPN  Indication: Duodenal perforation  Allergies  Allergen Reactions  . Penicillins     Some forms of penicillin  . Amoxicillin Rash  . Other Rash    Patient cannot remember name of medication but knows it was an antibiotic used to treat UTI    Patient Measurements: Height: '5\' 4"'  (162.6 cm) Weight: 155 lb (70.308 kg) IBW/kg (Calculated) : 54.7 Adjusted Body Weight: 56.6kg Usual Weight: 58 kg  Vital Signs: Temp: 99 F (37.2 C) (08/27 0525) Temp src: Oral (08/27 0525) BP: 119/76 mmHg (08/27 0525) Pulse Rate: 70 (08/27 0525) Intake/Output from previous day: 08/26 0701 - 08/27 0700 In: 10 [I.V.:10] Out: 825 [Urine:825] Intake/Output from this shift:    Labs:  Recent Labs  02/02/14 0512 02/03/14 0530  WBC 9.1 7.3  HGB 9.4* 8.9*  HCT 27.7* 27.1*  PLT 195 212     Recent Labs  02/01/14 0949 02/02/14 0512 02/03/14 0530 02/04/14 0522  NA 144 143 144 141  K 5.0 3.3* 3.3* 3.6*  CL 114* 112 111 105  CO2 18* '22 21 24  ' GLUCOSE 135* 141* 128* 106*  BUN '10 9 11 11  ' CREATININE 0.46* 0.46* 0.44* 0.46*  CALCIUM 8.6 7.9* 8.1* 8.2*  MG 1.9 1.8  --  2.1  PHOS 1.3* 2.1* 2.9 3.2  PROT  --  5.2*  --  5.6*  ALBUMIN  --  2.2*  --  2.3*  AST  --  31  --  32  ALT  --  20  --  28  ALKPHOS  --  131*  --  166*  BILITOT  --  0.5  --  0.6  PREALBUMIN  --  5.5*  --   --   TRIG  --  105  --   --    Estimated Creatinine Clearance: 70.1 ml/min (by C-G formula based on Cr of 0.46).    Recent Labs  02/03/14 1736 02/03/14 2341 02/04/14 0603  GLUCAP 122* 123* 111*   Insulin Requirements in the past 24 hours:  3 units Novolog  Current Nutrition: NPO Clinimix E 5/15 at 65 ml/hr and 20% lipid emulsion at 18m/hr will provide 1588 kcal and 78 gm protein  Nutritional Goals:  1500-1700 kCal, 70-80 grams of protein per day (per RD note 8/24)  Assessment: Admit: Admitted with diffuse abdominal pain found to  have perforated duodenum. No significant PMH.  GI: Bowel rest/TPN for retroperitoneal duodenal perforation. +flatus. CT on Friday to evaluate for leak. Prealbumin 5.5 (low).   Endo: No h/o DM. Empiric sensitive SSI/CBGs and plan to d/c once at goal rate if SSI requirements remain minimal.  Lytes: K 3.6, Other lytes WNL.  Renal: SCr stable. MIVF: D5NS w/ 20 KCl at 50 ml/hr.  Pulm: RA  Cards: No hx. VSS  Hepatobil: Alk phos slightly elevated, other LFTs wnl. TG 105  Neuro: A&O  ID: Primaxin D#6 for perforated duodenum. Afebrile. Wbc wnl.  Hem/Onc: Hgb low but stable, plt ok.     Best Practices: SQ heparin, PPI IV  TPN Access: PICC 8/24  TPN day#: 3  Plan:  1. Continue Clinimix E 5/15 at 657mhr and 20% lipid emulsion at 109mr. This meets 100% pt's estimated needs. 2. Change MIVF to D5NS with 48m70mCL at 50ml74m3. Trace elements and MVI daily to TPN. 4. F/u BMET and CT scan  CristJoya SanrmD Candidiate  02/04/2014,8:42 AM   I agree with above.  Sherlon Handing, PharmD, BCPS Clinical pharmacist, pager [phone] 02/04/2014  8:58 AM

## 2014-02-04 NOTE — Progress Notes (Signed)
Diuresing. CT tomorrow. Patient examined and I agree with the assessment and plan  Georganna Skeans, MD, MPH, FACS Trauma: [phone] General Surgery: [phone]  02/04/2014 2:31 PM

## 2014-02-04 NOTE — Progress Notes (Signed)
Patient ID: Brianna Fuller, female   DOB: 01-Jan-1951, 63 y.o.   MRN: [id number]     [name]      [address] [place], [place] [address]    Phone: [phone] FAX: [phone]     Subjective: Stable pain.  No n/v.  Passing flatus. Afebrile.  VSS.    Objective:  Vital signs:  Filed Vitals:   02/03/14 1252 02/03/14 2235 02/04/14 0525 02/04/14 0700  BP: 110/63 127/56 119/76   Pulse: 78 80 70   Temp: 98.4 F (36.9 C) 99.5 F (37.5 C) 99 F (37.2 C)   TempSrc: Oral Oral Oral   Resp: '16 16 17   ' Height:      Weight:    155 lb (70.308 kg)  SpO2: 98% 98% 98%     Last BM Date: 02/03/14  Intake/Output   Yesterday:  08/26 0701 - 08/27 0700 In: 10 [I.V.:10] Out: 825 [Urine:825] This shift:    I/O last 3 completed shifts: In: 1760 [I.V.:1060] Out: 1176 [Urine:1175; Stool:1]    Physical Exam:  General: Pt awake/alert/oriented x4 in no acute distress  Chest: cta. No chest wall pain w good excursion  CV: Pulses intact. Regular rhythm  MS: Normal AROM mjr joints. No obvious deformity  Abdomen: Soft. Nondistended. Non tender. No evidence of peritonitis. No incarcerated hernias.  Ext: SCDs BLE. No mjr edema. No cyanosis  Skin: No petechiae / purpura     Problem List:   Active Problems:   Duodenal perforation    Results:   Labs: Results for orders placed during the hospital encounter of 01/29/14 (from the past 48 hour(s))  GLUCOSE, CAPILLARY     Status: Abnormal   Collection Time    02/02/14  1:48 PM      Result Value Ref Range   Glucose-Capillary 126 (*) 70 - 99 mg/dL  GLUCOSE, CAPILLARY     Status: Abnormal   Collection Time    02/03/14 12:00 AM      Result Value Ref Range   Glucose-Capillary 142 (*) 70 - 99 mg/dL  CBC     Status: Abnormal   Collection Time    02/03/14  5:30 AM      Result Value Ref Range   WBC 7.3  4.0 - 10.5 K/uL   RBC 3.02 (*) 3.87 - 5.11 MIL/uL   Hemoglobin 8.9 (*) 12.0 - 15.0  g/dL   HCT 27.1 (*) 36.0 - 46.0 %   MCV 89.7  78.0 - 100.0 fL   MCH 29.5  26.0 - 34.0 pg   MCHC 32.8  30.0 - 36.0 g/dL   RDW 13.9  11.5 - 15.5 %   Platelets 212  150 - 400 K/uL  BASIC METABOLIC PANEL     Status: Abnormal   Collection Time    02/03/14  5:30 AM      Result Value Ref Range   Sodium 144  137 - 147 mEq/L   Potassium 3.3 (*) 3.7 - 5.3 mEq/L   Chloride 111  96 - 112 mEq/L   CO2 21  19 - 32 mEq/L   Glucose, Bld 128 (*) 70 - 99 mg/dL   BUN 11  6 - 23 mg/dL   Creatinine, Ser 0.44 (*) 0.50 - 1.10 mg/dL   Calcium 8.1 (*) 8.4 - 10.5 mg/dL   GFR calc non Af Amer >90  >90 mL/min   GFR calc Af Amer >90  >90 mL/min   Comment: (  NOTE)     The eGFR has been calculated using the CKD EPI equation.     This calculation has not been validated in all clinical situations.     eGFR's persistently <90 mL/min signify possible Chronic Kidney     Disease.   Anion gap 12  5 - 15  PHOSPHORUS     Status: None   Collection Time    02/03/14  5:30 AM      Result Value Ref Range   Phosphorus 2.9  2.3 - 4.6 mg/dL  GLUCOSE, CAPILLARY     Status: Abnormal   Collection Time    02/03/14  6:06 AM      Result Value Ref Range   Glucose-Capillary 128 (*) 70 - 99 mg/dL  GLUCOSE, CAPILLARY     Status: Abnormal   Collection Time    02/03/14 12:31 PM      Result Value Ref Range   Glucose-Capillary 130 (*) 70 - 99 mg/dL  GLUCOSE, CAPILLARY     Status: Abnormal   Collection Time    02/03/14 12:48 PM      Result Value Ref Range   Glucose-Capillary 125 (*) 70 - 99 mg/dL  GLUCOSE, CAPILLARY     Status: Abnormal   Collection Time    02/03/14  5:36 PM      Result Value Ref Range   Glucose-Capillary 122 (*) 70 - 99 mg/dL  GLUCOSE, CAPILLARY     Status: Abnormal   Collection Time    02/03/14 11:41 PM      Result Value Ref Range   Glucose-Capillary 123 (*) 70 - 99 mg/dL  COMPREHENSIVE METABOLIC PANEL     Status: Abnormal   Collection Time    02/04/14  5:22 AM      Result Value Ref Range   Sodium 141   137 - 147 mEq/L   Potassium 3.6 (*) 3.7 - 5.3 mEq/L   Chloride 105  96 - 112 mEq/L   CO2 24  19 - 32 mEq/L   Glucose, Bld 106 (*) 70 - 99 mg/dL   BUN 11  6 - 23 mg/dL   Creatinine, Ser 0.46 (*) 0.50 - 1.10 mg/dL   Calcium 8.2 (*) 8.4 - 10.5 mg/dL   Total Protein 5.6 (*) 6.0 - 8.3 g/dL   Albumin 2.3 (*) 3.5 - 5.2 g/dL   AST 32  0 - 37 U/L   ALT 28  0 - 35 U/L   Alkaline Phosphatase 166 (*) 39 - 117 U/L   Total Bilirubin 0.6  0.3 - 1.2 mg/dL   GFR calc non Af Amer >90  >90 mL/min   GFR calc Af Amer >90  >90 mL/min   Comment: (NOTE)     The eGFR has been calculated using the CKD EPI equation.     This calculation has not been validated in all clinical situations.     eGFR's persistently <90 mL/min signify possible Chronic Kidney     Disease.   Anion gap 12  5 - 15  MAGNESIUM     Status: None   Collection Time    02/04/14  5:22 AM      Result Value Ref Range   Magnesium 2.1  1.5 - 2.5 mg/dL  PHOSPHORUS     Status: None   Collection Time    02/04/14  5:22 AM      Result Value Ref Range   Phosphorus 3.2  2.3 - 4.6 mg/dL  GLUCOSE, CAPILLARY  Status: Abnormal   Collection Time    02/04/14  6:03 AM      Result Value Ref Range   Glucose-Capillary 111 (*) 70 - 99 mg/dL    Imaging / Studies: No results found.  Medications / Allergies:  Scheduled Meds: . antiseptic oral rinse  7 mL Mouth Rinse q12n4p  . chlorhexidine  15 mL Mouth Rinse BID  . heparin subcutaneous  5,000 Units Subcutaneous 3 times per day  . imipenem-cilastatin  250 mg Intravenous Q6H  . insulin aspart  0-9 Units Subcutaneous 4 times per day  . pantoprazole (PROTONIX) IV  40 mg Intravenous Q12H  . potassium chloride  10 mEq Intravenous Q1 Hr x 2  . sodium chloride  10-40 mL Intracatheter Q12H   Continuous Infusions: . dextrose 5 % and 0.9 % NaCl with KCl 40 mEq/L 50 mL/hr at 02/04/14 0942  . Marland KitchenTPN (CLINIMIX-E) Adult 65 mL/hr at 02/03/14 1724   And  . fat emulsion 250 mL (02/03/14 1724)   PRN  Meds:.acetaminophen, HYDROmorphone (DILAUDID) injection, ondansetron, promethazine, sodium chloride  Antibiotics: Anti-infectives   Start     Dose/Rate Route Frequency Ordered Stop   01/30/14 0800  imipenem-cilastatin (PRIMAXIN) 250 mg in sodium chloride 0.9 % 100 mL IVPB     250 mg 200 mL/hr over 30 Minutes Intravenous Every 6 hours 01/30/14 0740        Assessment/Plan Retroperitoneal duodenal perforation  -continue with bowel rest  -Invanz  -TPN  -mobilize/IS  -pain control/anti-emetics  -SCD/heparin  -CT of abd/pelvis with oral contrast on Friday Hypokalemia-supplement per pwe'll check BNP.harmacy  Weight gain-no overt signs of heart failure, 21lb weight gain since admission, likely IVF related.  Check BNP.     Erby Pian, [facility] Pager [phone]) For consults and floor pages call [phone](7A-4:30P)  02/04/2014 9:51 AM

## 2014-02-05 ENCOUNTER — Inpatient Hospital Stay (HOSPITAL_COMMUNITY): Payer: BC Managed Care – PPO

## 2014-02-05 ENCOUNTER — Encounter (HOSPITAL_COMMUNITY): Payer: Self-pay

## 2014-02-05 LAB — APTT: APTT: 34 s (ref 24–37)

## 2014-02-05 LAB — CBC
HEMATOCRIT: 29.4 % — AB (ref 36.0–46.0)
Hemoglobin: 9.9 g/dL — ABNORMAL LOW (ref 12.0–15.0)
MCH: 30.7 pg (ref 26.0–34.0)
MCHC: 33.7 g/dL (ref 30.0–36.0)
MCV: 91.3 fL (ref 78.0–100.0)
Platelets: 268 10*3/uL (ref 150–400)
RBC: 3.22 MIL/uL — ABNORMAL LOW (ref 3.87–5.11)
RDW: 13.5 % (ref 11.5–15.5)
WBC: 7.2 10*3/uL (ref 4.0–10.5)

## 2014-02-05 LAB — BASIC METABOLIC PANEL
ANION GAP: 9 (ref 5–15)
BUN: 11 mg/dL (ref 6–23)
CALCIUM: 8.5 mg/dL (ref 8.4–10.5)
CO2: 27 mEq/L (ref 19–32)
CREATININE: 0.46 mg/dL — AB (ref 0.50–1.10)
Chloride: 101 mEq/L (ref 96–112)
GFR calc Af Amer: >90 mL/min (ref >90)
GFR calc non Af Amer: >90 mL/min (ref >90)
Glucose, Bld: 120 mg/dL — ABNORMAL HIGH (ref 70–99)
Potassium: 3.7 mEq/L (ref 3.7–5.3)
Sodium: 137 mEq/L (ref 137–147)

## 2014-02-05 LAB — PROTIME-INR
INR: 1.02 (ref 0.00–1.49)
PROTHROMBIN TIME: 13.4 s (ref 11.6–15.2)

## 2014-02-05 LAB — GLUCOSE, CAPILLARY: Glucose-Capillary: 116 mg/dL — ABNORMAL HIGH (ref 70–99)

## 2014-02-05 MED ORDER — IOHEXOL 300 MG/ML  SOLN
100.0000 mL | Freq: Once | INTRAMUSCULAR | Status: AC | PRN
  Administered 2014-02-05: 100 mL via INTRAVENOUS

## 2014-02-05 MED ORDER — POTASSIUM CHLORIDE 10 MEQ/100ML IV SOLN
10.0000 meq | INTRAVENOUS | Status: AC
Start: 2014-02-05 — End: 2014-02-05
  Administered 2014-02-05 (×2): 10 meq via INTRAVENOUS
  Filled 2014-02-05 (×2): qty 100

## 2014-02-05 MED ORDER — FENTANYL CITRATE 0.05 MG/ML IJ SOLN
INTRAMUSCULAR | Status: AC | PRN
  Administered 2014-02-05 (×2): 50 ug via INTRAVENOUS

## 2014-02-05 MED ORDER — FAT EMULSION 20 % IV EMUL
250.0000 mL | INTRAVENOUS | Status: AC
  Administered 2014-02-05: 250 mL via INTRAVENOUS
  Filled 2014-02-05: qty 250

## 2014-02-05 MED ORDER — MIDAZOLAM HCL 2 MG/2ML IJ SOLN
INTRAMUSCULAR | Status: AC
  Filled 2014-02-05: qty 6

## 2014-02-05 MED ORDER — IOHEXOL 300 MG/ML  SOLN
25.0000 mL | INTRAMUSCULAR | Status: AC
  Administered 2014-02-05 (×2): 25 mL via ORAL

## 2014-02-05 MED ORDER — FENTANYL CITRATE 0.05 MG/ML IJ SOLN
INTRAMUSCULAR | Status: AC
  Filled 2014-02-05: qty 4

## 2014-02-05 MED ORDER — FUROSEMIDE 10 MG/ML IJ SOLN
40.0000 mg | Freq: Once | INTRAMUSCULAR | Status: AC
  Administered 2014-02-05: 40 mg via INTRAVENOUS
  Filled 2014-02-05: qty 4

## 2014-02-05 MED ORDER — LIDOCAINE-EPINEPHRINE 1 %-1:100000 IJ SOLN
INTRAMUSCULAR | Status: AC
  Filled 2014-02-05: qty 1

## 2014-02-05 MED ORDER — TRACE MINERALS CR-CU-F-FE-I-MN-MO-SE-ZN IV SOLN
65.0000 mL/h | INTRAVENOUS | Status: AC
  Administered 2014-02-05: 65 mL/h via INTRAVENOUS
  Filled 2014-02-05: qty 2000

## 2014-02-05 MED ORDER — MIDAZOLAM HCL 2 MG/2ML IJ SOLN
INTRAMUSCULAR | Status: AC | PRN
  Administered 2014-02-05: 2 mg via INTRAVENOUS
  Administered 2014-02-05: 1 mg via INTRAVENOUS

## 2014-02-05 NOTE — Progress Notes (Signed)
I D/W Dr. Pascal Lux - he will try to place a drain in her retroperitoneal collection. Patient examined and I agree with the assessment and plan  Georganna Skeans, MD, MPH, FACS Trauma: [phone] General Surgery: [phone]  02/05/2014 12:14 PM

## 2014-02-05 NOTE — Progress Notes (Signed)
NUTRITION FOLLOW UP  Intervention:   -TPN per pharmacy -Diet advancement per MD -Will continue to monitor  Nutrition Dx:   Inadequate oral intake related to altered GI function as evidenced by NPO status; ongoing   Goal:   Pt to meet >/= 90% of their estimated nutrition needs; met w/TPN   Monitor:   Diet order, GI profile, labs, weights, TPN tolerance/perscriptions, I/Os  Assessment:   8/24: Patient reports a good appetite PTA. No recent weight loss. No muscle or subcutaneous fat depletion noticed.  Plan is for bowel rest and ABX therapy.  RD consulted for new TPN.  Patient is receiving TPN with Clinimix E 5/15 @ 30 ml/hr and lipids @ 10 ml/hr. Provides 991 kcal and 36 grams protein per day. Meets 66% minimum estimated energy needs and 51% minimum estimated protein needs.   8/28: -Pt to undergo abscess drain placement. -Remains NPO for bowel rest, passing flatus and having BMs. Receiving protonix. Diet advancement per MD -MD noted pt w/fluid overload, was diuresed 2 liters. Receiving lasix -TPN at goal rate. Pt receiving Clinimix E 5/15 at 65 ml/hr with lipids 10 ml/hr which provides 1588 kcal (100% est kcal needs), and 78 gram protein (100% est protein) -Glucose < 150 mg/dl -Phos/Mg/K WNL    Height: Ht Readings from Last 1 Encounters:  02/01/14 '5\' 4"'  (1.626 m)    Weight Status:   Wt Readings from Last 1 Encounters:  02/04/14 155 lb (70.308 kg)  01/30/14 134 lbs- weight fluctuations likely fluid related  Re-estimated needs:  Kcal: 1500-1700  Protein: 70-80 gm  Fluid: 1.5-1.7 L   Skin: WDL  Diet Order: NPO   Intake/Output Summary (Last 24 hours) at 02/05/14 1243 Last data filed at 02/04/14 2135  Gross per 24 hour  Intake    260 ml  Output   1650 ml  Net  -1390 ml    Last BM: 8/25-8/26   Labs:   Recent Labs Lab 02/01/14 0949 02/02/14 0512 02/03/14 0530 02/04/14 0522 02/05/14 0505  NA 144 143 144 141 137  K 5.0 3.3* 3.3* 3.6* 3.7  CL 114*  112 111 105 101  CO2 18* '22 21 24 27  ' BUN '10 9 11 11 11  ' CREATININE 0.46* 0.46* 0.44* 0.46* 0.46*  CALCIUM 8.6 7.9* 8.1* 8.2* 8.5  MG 1.9 1.8  --  2.1  --   PHOS 1.3* 2.1* 2.9 3.2  --   GLUCOSE 135* 141* 128* 106* 120*    CBG (last 3)   Recent Labs  02/04/14 1823 02/04/14 2353 02/05/14 0616  GLUCAP 118* 125* 116*    Scheduled Meds: . antiseptic oral rinse  7 mL Mouth Rinse q12n4p  . chlorhexidine  15 mL Mouth Rinse BID  . imipenem-cilastatin  250 mg Intravenous Q6H  . pantoprazole (PROTONIX) IV  40 mg Intravenous Q12H  . potassium chloride  10 mEq Intravenous Q1 Hr x 2  . sodium chloride  10-40 mL Intracatheter Q12H    Continuous Infusions: . fat emulsion     And  . Marland KitchenTPN (CLINIMIX-E) Adult    . Marland KitchenTPN (CLINIMIX-E) Adult     And  . fat emulsion    . dextrose 5 % and 0.9 % NaCl with KCl 40 mEq/L 10 mL/hr at 02/05/14 [place] LDN Clinical Dietitian [phone]

## 2014-02-05 NOTE — Progress Notes (Signed)
Patient ID: Brianna Fuller, female   DOB: 1951/01/27, 63 y.o.   MRN: [id number]     [facility]      [address] [place], [place] [address]    Phone: [phone] FAX: [phone]     Subjective: Having BMs.  Afebrile.  VSS.  No white count.  Voiding.  Ambulating.  Mild pain.   Objective:  Vital signs:  Filed Vitals:   02/04/14 0700 02/04/14 1400 02/04/14 2130 02/05/14 0615  BP:  112/76 109/66 131/83  Pulse:  77 71 75  Temp:  99 F (37.2 C) 98.7 F (37.1 C) 98.2 F (36.8 C)  TempSrc:  Oral Oral Oral  Resp:  _0 Height:      Weight: 155 lb (70.308 kg)     SpO2:  97% 98% 99%    Last BM Date: 02/02/14  Intake/Output   Yesterday:  08/27 0701 - 08/28 0700 In: 260 [I.V.:260] Out: 1950 [Urine:1950] This shift:    I/O last 3 completed shifts: In: 260 [I.V.:260] Out: 2300 [Urine:2300]    Physical Exam:  General: Pt awake/alert/oriented x4 in no acute distress  Chest: cta. No chest wall pain w good excursion  CV: Pulses intact. Regular rhythm  MS: Normal AROM mjr joints. No obvious deformity  Abdomen: Soft. Nondistended. Non tender. No evidence of peritonitis. No incarcerated hernias.  Ext: SCDs BLE. No mjr edema. No cyanosis  Skin: No petechiae / purpura   Problem List:   Active Problems:   Duodenal perforation    Results:   Labs: Results for orders placed during the hospital encounter of 01/29/14 (from the past 48 hour(s))  GLUCOSE, CAPILLARY     Status: Abnormal   Collection Time    02/03/14 12:31 PM      Result Value Ref Range   Glucose-Capillary 130 (*) 70 - 99 mg/dL  GLUCOSE, CAPILLARY     Status: Abnormal   Collection Time    02/03/14 12:48 PM      Result Value Ref Range   Glucose-Capillary 125 (*) 70 - 99 mg/dL  GLUCOSE, CAPILLARY     Status: Abnormal   Collection Time    02/03/14  5:36 PM      Result Value Ref Range   Glucose-Capillary 122 (*) 70 - 99 mg/dL  GLUCOSE, CAPILLARY      Status: Abnormal   Collection Time    02/03/14 11:41 PM      Result Value Ref Range   Glucose-Capillary 123 (*) 70 - 99 mg/dL  COMPREHENSIVE METABOLIC PANEL     Status: Abnormal   Collection Time    02/04/14  5:22 AM      Result Value Ref Range   Sodium 141  137 - 147 mEq/L   Potassium 3.6 (*) 3.7 - 5.3 mEq/L   Chloride 105  96 - 112 mEq/L   CO2 24  19 - 32 mEq/L   Glucose, Bld 106 (*) 70 - 99 mg/dL   BUN 11  6 - 23 mg/dL   Creatinine, Ser 0.46 (*) 0.50 - 1.10 mg/dL   Calcium 8.2 (*) 8.4 - 10.5 mg/dL   Total Protein 5.6 (*) 6.0 - 8.3 g/dL   Albumin 2.3 (*) 3.5 - 5.2 g/dL   AST 32  0 - 37 U/L   ALT 28  0 - 35 U/L   Alkaline Phosphatase 166 (*) 39 - 117 U/L   Total Bilirubin 0.6  0.3 - 1.2 mg/dL  GFR calc non Af Amer >90  >90 mL/min   GFR calc Af Amer >90  >90 mL/min   Comment: (NOTE)     The eGFR has been calculated using the CKD EPI equation.     This calculation has not been validated in all clinical situations.     eGFR's persistently <90 mL/min signify possible Chronic Kidney     Disease.   Anion gap 12  5 - 15  MAGNESIUM     Status: None   Collection Time    02/04/14  5:22 AM      Result Value Ref Range   Magnesium 2.1  1.5 - 2.5 mg/dL  PHOSPHORUS     Status: None   Collection Time    02/04/14  5:22 AM      Result Value Ref Range   Phosphorus 3.2  2.3 - 4.6 mg/dL  PRO B NATRIURETIC PEPTIDE     Status: Abnormal   Collection Time    02/04/14  5:22 AM      Result Value Ref Range   Pro B Natriuretic peptide (BNP) 467.6 (*) 0 - 125 pg/mL  GLUCOSE, CAPILLARY     Status: Abnormal   Collection Time    02/04/14  6:03 AM      Result Value Ref Range   Glucose-Capillary 111 (*) 70 - 99 mg/dL  GLUCOSE, CAPILLARY     Status: Abnormal   Collection Time    02/04/14 12:22 PM      Result Value Ref Range   Glucose-Capillary 107 (*) 70 - 99 mg/dL  GLUCOSE, CAPILLARY     Status: Abnormal   Collection Time    02/04/14  6:23 PM      Result Value Ref Range   Glucose-Capillary  118 (*) 70 - 99 mg/dL   Comment 1 Notify RN    GLUCOSE, CAPILLARY     Status: Abnormal   Collection Time    02/04/14 11:53 PM      Result Value Ref Range   Glucose-Capillary 125 (*) 70 - 99 mg/dL   Comment 1 Notify RN    BASIC METABOLIC PANEL     Status: Abnormal   Collection Time    02/05/14  5:05 AM      Result Value Ref Range   Sodium 137  137 - 147 mEq/L   Potassium 3.7  3.7 - 5.3 mEq/L   Chloride 101  96 - 112 mEq/L   CO2 27  19 - 32 mEq/L   Glucose, Bld 120 (*) 70 - 99 mg/dL   BUN 11  6 - 23 mg/dL   Creatinine, Ser 0.46 (*) 0.50 - 1.10 mg/dL   Calcium 8.5  8.4 - 10.5 mg/dL   GFR calc non Af Amer >90  >90 mL/min   GFR calc Af Amer >90  >90 mL/min   Comment: (NOTE)     The eGFR has been calculated using the CKD EPI equation.     This calculation has not been validated in all clinical situations.     eGFR's persistently <90 mL/min signify possible Chronic Kidney     Disease.   Anion gap 9  5 - 15  CBC     Status: Abnormal   Collection Time    02/05/14  5:05 AM      Result Value Ref Range   WBC 7.2  4.0 - 10.5 K/uL   RBC 3.22 (*) 3.87 - 5.11 MIL/uL   Hemoglobin 9.9 (*) 12.0 - 15.0 g/dL  HCT 29.4 (*) 36.0 - 46.0 %   MCV 91.3  78.0 - 100.0 fL   MCH 30.7  26.0 - 34.0 pg   MCHC 33.7  30.0 - 36.0 g/dL   RDW 13.5  11.5 - 15.5 %   Platelets 268  150 - 400 K/uL  GLUCOSE, CAPILLARY     Status: Abnormal   Collection Time    02/05/14  6:16 AM      Result Value Ref Range   Glucose-Capillary 116 (*) 70 - 99 mg/dL    Imaging / Studies: Ct Abdomen Pelvis W Contrast  02/05/2014   CLINICAL DATA:  Possible duodenum perforation.  Abdominal pain.  EXAM: CT ABDOMEN AND PELVIS WITH CONTRAST  TECHNIQUE: Multidetector CT imaging of the abdomen and pelvis was performed using the standard protocol following bolus administration of intravenous contrast.  CONTRAST:  138m OMNIPAQUE IOHEXOL 300 MG/ML  SOLN  COMPARISON:  CT scan of January 30, 2014.  FINDINGS: Mild bilateral pleural effusions  with adjacent subsegmental atelectasis are noted with right greater than left. No definite osseous abnormality is noted.  Sludge is noted within the gallbladder. No focal abnormality is noted in the liver, spleen or pancreas. Adrenal glands appear normal. No hydronephrosis or renal obstruction is noted. There is no evidence of bowel obstruction. There is again noted severe wall thickening and surrounding inflammation involving the second and third portions of the duodenum consistent with duodenitis or peptic ulcer disease. Perforation is seen along the superior aspect of the third portion of the duodenum which connects to adjacent fluid collection described on prior exam, best seen on image numbers 28 and 29 of the coronal reconstructions. This collection is enlarged compared to prior exam, now measuring 6.1 x 3.8 x 2.4 cm. It contains fluid and gas. It is seen to extend into the inferior and anterior portion of the right perinephric space where it is immediately adjacent to the lower pole of the right kidney. It demonstrates peripheral enhancement suggesting developing abscess in this area. Urinary bladder and uterus appear normal. Mild anasarca is noted.  IMPRESSION: Bilateral pleural effusions are noted with adjacent subsegmental atelectasis, right greater than left.  There is continued severe inflammation involving the second and third portion of the duodenum secondary to duodenitis or peptic ulcer disease. Perforation is seen involving the superior aspect of the third portion the duodenum which extends into previously described fluid collection adjacent to the duodenum and posterior to the gallbladder. This fluid collection is increased in size, now measuring 6.1 x 3.8 x 2.4 cm. It is seen to extend into the inferior portion of the right perinephric base as noted on prior exam, but now demonstrates peripherally enhancing walls consistent with developing abscess anterior to the lower pole of the right kidney.  These results will be called to the ordering clinician or representative by the Radiologist Assistant, and communication documented in the PACS or zVision Dashboard.   Electronically Signed   By: JSabino DickM.D.   On: 02/05/2014 08:29    Medications / Allergies:  Scheduled Meds: . antiseptic oral rinse  7 mL Mouth Rinse q12n4p  . chlorhexidine  15 mL Mouth Rinse BID  . imipenem-cilastatin  250 mg Intravenous Q6H  . pantoprazole (PROTONIX) IV  40 mg Intravenous Q12H  . potassium chloride  10 mEq Intravenous Q1 Hr x 2  . sodium chloride  10-40 mL Intracatheter Q12H   Continuous Infusions: . fat emulsion     And  . .Marland KitchenPN (CLINIMIX-E) Adult    . .Marland Kitchen  TPN (CLINIMIX-E) Adult     And  . fat emulsion    . dextrose 5 % and 0.9 % NaCl with KCl 40 mEq/L 10 mL/hr at 02/05/14 0952   PRN Meds:.acetaminophen, HYDROmorphone (DILAUDID) injection, ondansetron, promethazine, sodium chloride  Antibiotics: Anti-infectives   Start     Dose/Rate Route Frequency Ordered Stop   01/30/14 0800  imipenem-cilastatin (PRIMAXIN) 250 mg in sodium chloride 0.9 % 100 mL IVPB     250 mg 200 mL/hr over 30 Minutes Intravenous Every 6 hours 01/30/14 0740        Assessment/Plan  Retroperitoneal duodenal perforation  -CT of abdomen today with ongoing perforation and abscess.  IR consulted for perc drain placement. -continue with bowel rest  -Invanz  -TPN  -mobilize/IS  -pain control/anti-emetics  -SCD/heparin---needs to be resumed after IR procedure  -protonix BI -? Check for h pylori Hypokalemia-supplement 74mq along with lasix Fluid overload-diuresed 2 liters, still +7L.  Will give 48mIV lasix today.    EmErby PianANMontefiore [facility]ake[facility] Hospitalurgery Pager [phone] consults and floor pages call [phone](7A-4:30P)  02/05/2014 10:36 AM

## 2014-02-05 NOTE — Progress Notes (Signed)
Patient to IR

## 2014-02-05 NOTE — Progress Notes (Signed)
PARENTERAL NUTRITION/ANTIBIOTIC CONSULT NOTE - Follow-up  Pharmacy Consult for TPN and Primaxin Indication: Duodenal perforation  Allergies  Allergen Reactions  . Penicillins     Some forms of penicillin  . Amoxicillin Rash  . Other Rash    Patient cannot remember name of medication but knows it was an antibiotic used to treat UTI    Patient Measurements: Height: '5\' 4"'  (162.6 cm) Weight: 155 lb (70.308 kg) IBW/kg (Calculated) : 54.7 Adjusted Body Weight: 56.6kg Usual Weight: 58 kg  Vital Signs: Temp: 98.2 F (36.8 C) (08/28 0615) Temp src: Oral (08/28 0615) BP: 131/83 mmHg (08/28 0615) Pulse Rate: 75 (08/28 0615) Intake/Output from previous day: 08/27 0701 - 08/28 0700 In: 260 [I.V.:260] Out: 1950 [Urine:1950] Intake/Output from this shift:    Labs:  Recent Labs  02/03/14 0530 02/05/14 0505  WBC 7.3 7.2  HGB 8.9* 9.9*  HCT 27.1* 29.4*  PLT 212 268     Recent Labs  02/03/14 0530 02/04/14 0522 02/05/14 0505  NA 144 141 137  K 3.3* 3.6* 3.7  CL 111 105 101  CO2 '21 24 27  ' GLUCOSE 128* 106* 120*  BUN '11 11 11  ' CREATININE 0.44* 0.46* 0.46*  CALCIUM 8.1* 8.2* 8.5  MG  --  2.1  --   PHOS 2.9 3.2  --   PROT  --  5.6*  --   ALBUMIN  --  2.3*  --   AST  --  32  --   ALT  --  28  --   ALKPHOS  --  166*  --   BILITOT  --  0.6  --    Estimated Creatinine Clearance: 70.1 ml/min (by C-G formula based on Cr of 0.46).    Recent Labs  02/04/14 1823 02/04/14 2353 02/05/14 0616  GLUCAP 118* 125* 116*   Insulin Requirements in the past 24 hours:  1 units Novolog  Current Nutrition: NPO Clinimix E 5/15 at 65 ml/hr and 20% lipid emulsion at 62m/hr provides 1588 kcal and 78 gm protein  Nutritional Goals:  1500-1700 kCal, 70-80 grams of protein per day (per RD note 8/24)  Assessment: Admit: Admitted with diffuse abdominal pain found to have perforated duodenum. No significant PMH.  GI: Bowel rest/TPN for retroperitoneal duodenal perforation. +flatus.  CT today to evaluate for leak. Prealbumin 5.5 (low at baseline).   Endo: No h/o DM. D/c empiric sensitive SSI/CBGs as minimal insulin requirements on goal TPN.   Lytes: lytes WNL.  Renal: SCr stable. MIVF: D5NS w/ 40 KCl at 50 ml/hr. Wt up to 155 lb (from 134 lb on admit). I/O not being charted accurately.  Pulm: RA  Cards: No hx. VSS  Hepatobil: Alk phos slightly elevated, other LFTs wnl. TG 105  Neuro: A&O  ID: Primaxin D#7 for perforated duodenum. Afebrile. Wbc wnl.  Hem/Onc: Hgb low but stable, plt ok.     Best Practices: SQ heparin, PPI IV  TPN Access: PICC 8/24  TPN day#: 4  Plan:  1. Continue Clinimix E 5/15 634mhr and 20% lipid emulsion 1049mr. This meets 100% pt's estimated needs. 2. Decrease MIVF (D5NS with 83m28mCl) to KVO 3. Trace elements and MVI daily to TPN. 4. D/c CBGs/SSI  5. F/u CT scan results 6. Continue Primaxin 250mg1mq6h.  7. Will continue to follow renal function, pt's clinical condition, LOT with abx   CaronSherlon HandingrmD, BCPS Clinical pharmacist, pager [phone]  9:27 AM

## 2014-02-05 NOTE — Procedures (Signed)
Technically successful CT guided drainage catheter placement into peri-duodenal abscess yielding 10 cc of purulent material.  Samples sent to laboratory.  No immediate post procedural complications.

## 2014-02-05 NOTE — H&P (Signed)
Brianna Fuller is an 63 y.o. female.   Chief Complaint: pt presented with N/V abd pain CT reveals retroperitoneal duodenal perforation Has been treated medically x 1 week CT today shows continued collection Continued abd pain Request made for Abdominal abscess drain placement per [facility] Dr Pascal Lux has reviewed imaging I have seen and examined pt Now scheduled for abscess drain  HPI: HTN; perf duodenum  Past Medical History  Diagnosis Date  . Thyroid nodule 4/09    right  . Osteoporosis   . Hypertension     Past Surgical History  Procedure Laterality Date  . Combined hysteroscopy diagnostic / d&c  12/09    Family History  Problem Relation Age of Onset  . Hypertension Father   . Cancer Brother     skin  . Osteoporosis Mother   . Hyperlipidemia Mother    Social History:  reports that she has never smoked. She has never used smokeless tobacco. She reports that she drinks about 2 - 2.5 ounces of alcohol per week. She reports that she does not use illicit drugs.  Allergies:  Allergies  Allergen Reactions  . Penicillins     Some forms of penicillin  . Amoxicillin Rash  . Other Rash    Patient cannot remember name of medication but knows it was an antibiotic used to treat UTI    Medications Prior to Admission  Medication Sig Dispense Refill  . Ascorbic Acid (VITAMIN C PO) Take 500 mg by mouth as needed.      . Calcium Carbonate-Vitamin D (CALCIUM + D PO) Take 1-2 tablets by mouth daily as needed (calcium).       . Cholecalciferol (VITAMIN D) 2000 UNITS CAPS Take 2,000 Units by mouth daily.      Marland Kitchen estrogen, conjugated,-medroxyprogesterone (PREMPRO) 0.3-1.5 MG per tablet Take 1 tablet by mouth daily.  1 tablet  12  . ibuprofen (ADVIL,MOTRIN) 200 MG tablet Take 200 mg by mouth every 6 (six) hours as needed.      . loratadine (CLARITIN) 10 MG tablet Take 10 mg by mouth daily. For allergies        Results for orders placed during the hospital encounter of 01/29/14 (from the  past 48 hour(s))  GLUCOSE, CAPILLARY     Status: Abnormal   Collection Time    02/03/14 12:31 PM      Result Value Ref Range   Glucose-Capillary 130 (*) 70 - 99 mg/dL  GLUCOSE, CAPILLARY     Status: Abnormal   Collection Time    02/03/14 12:48 PM      Result Value Ref Range   Glucose-Capillary 125 (*) 70 - 99 mg/dL  GLUCOSE, CAPILLARY     Status: Abnormal   Collection Time    02/03/14  5:36 PM      Result Value Ref Range   Glucose-Capillary 122 (*) 70 - 99 mg/dL  GLUCOSE, CAPILLARY     Status: Abnormal   Collection Time    02/03/14 11:41 PM      Result Value Ref Range   Glucose-Capillary 123 (*) 70 - 99 mg/dL  COMPREHENSIVE METABOLIC PANEL     Status: Abnormal   Collection Time    02/04/14  5:22 AM      Result Value Ref Range   Sodium 141  137 - 147 mEq/L   Potassium 3.6 (*) 3.7 - 5.3 mEq/L   Chloride 105  96 - 112 mEq/L   CO2 24  19 - 32 mEq/L   Glucose,  Bld 106 (*) 70 - 99 mg/dL   BUN 11  6 - 23 mg/dL   Creatinine, Ser 0.46 (*) 0.50 - 1.10 mg/dL   Calcium 8.2 (*) 8.4 - 10.5 mg/dL   Total Protein 5.6 (*) 6.0 - 8.3 g/dL   Albumin 2.3 (*) 3.5 - 5.2 g/dL   AST 32  0 - 37 U/L   ALT 28  0 - 35 U/L   Alkaline Phosphatase 166 (*) 39 - 117 U/L   Total Bilirubin 0.6  0.3 - 1.2 mg/dL   GFR calc non Af Amer >90  >90 mL/min   GFR calc Af Amer >90  >90 mL/min   Comment: (NOTE)     The eGFR has been calculated using the CKD EPI equation.     This calculation has not been validated in all clinical situations.     eGFR's persistently <90 mL/min signify possible Chronic Kidney     Disease.   Anion gap 12  5 - 15  MAGNESIUM     Status: None   Collection Time    02/04/14  5:22 AM      Result Value Ref Range   Magnesium 2.1  1.5 - 2.5 mg/dL  PHOSPHORUS     Status: None   Collection Time    02/04/14  5:22 AM      Result Value Ref Range   Phosphorus 3.2  2.3 - 4.6 mg/dL  PRO B NATRIURETIC PEPTIDE     Status: Abnormal   Collection Time    02/04/14  5:22 AM      Result Value Ref  Range   Pro B Natriuretic peptide (BNP) 467.6 (*) 0 - 125 pg/mL  GLUCOSE, CAPILLARY     Status: Abnormal   Collection Time    02/04/14  6:03 AM      Result Value Ref Range   Glucose-Capillary 111 (*) 70 - 99 mg/dL  GLUCOSE, CAPILLARY     Status: Abnormal   Collection Time    02/04/14 12:22 PM      Result Value Ref Range   Glucose-Capillary 107 (*) 70 - 99 mg/dL  GLUCOSE, CAPILLARY     Status: Abnormal   Collection Time    02/04/14  6:23 PM      Result Value Ref Range   Glucose-Capillary 118 (*) 70 - 99 mg/dL   Comment 1 Notify RN    GLUCOSE, CAPILLARY     Status: Abnormal   Collection Time    02/04/14 11:53 PM      Result Value Ref Range   Glucose-Capillary 125 (*) 70 - 99 mg/dL   Comment 1 Notify RN    BASIC METABOLIC PANEL     Status: Abnormal   Collection Time    02/05/14  5:05 AM      Result Value Ref Range   Sodium 137  137 - 147 mEq/L   Potassium 3.7  3.7 - 5.3 mEq/L   Chloride 101  96 - 112 mEq/L   CO2 27  19 - 32 mEq/L   Glucose, Bld 120 (*) 70 - 99 mg/dL   BUN 11  6 - 23 mg/dL   Creatinine, Ser 0.46 (*) 0.50 - 1.10 mg/dL   Calcium 8.5  8.4 - 10.5 mg/dL   GFR calc non Af Amer >90  >90 mL/min   GFR calc Af Amer >90  >90 mL/min   Comment: (NOTE)     The eGFR has been calculated using the CKD EPI equation.  This calculation has not been validated in all clinical situations.     eGFR's persistently <90 mL/min signify possible Chronic Kidney     Disease.   Anion gap 9  5 - 15  CBC     Status: Abnormal   Collection Time    02/05/14  5:05 AM      Result Value Ref Range   WBC 7.2  4.0 - 10.5 K/uL   RBC 3.22 (*) 3.87 - 5.11 MIL/uL   Hemoglobin 9.9 (*) 12.0 - 15.0 g/dL   HCT 29.4 (*) 36.0 - 46.0 %   MCV 91.3  78.0 - 100.0 fL   MCH 30.7  26.0 - 34.0 pg   MCHC 33.7  30.0 - 36.0 g/dL   RDW 13.5  11.5 - 15.5 %   Platelets 268  150 - 400 K/uL  GLUCOSE, CAPILLARY     Status: Abnormal   Collection Time    02/05/14  6:16 AM      Result Value Ref Range    Glucose-Capillary 116 (*) 70 - 99 mg/dL   Ct Abdomen Pelvis W Contrast  02/05/2014   CLINICAL DATA:  Possible duodenum perforation.  Abdominal pain.  EXAM: CT ABDOMEN AND PELVIS WITH CONTRAST  TECHNIQUE: Multidetector CT imaging of the abdomen and pelvis was performed using the standard protocol following bolus administration of intravenous contrast.  CONTRAST:  144m OMNIPAQUE IOHEXOL 300 MG/ML  SOLN  COMPARISON:  CT scan of January 30, 2014.  FINDINGS: Mild bilateral pleural effusions with adjacent subsegmental atelectasis are noted with right greater than left. No definite osseous abnormality is noted.  Sludge is noted within the gallbladder. No focal abnormality is noted in the liver, spleen or pancreas. Adrenal glands appear normal. No hydronephrosis or renal obstruction is noted. There is no evidence of bowel obstruction. There is again noted severe wall thickening and surrounding inflammation involving the second and third portions of the duodenum consistent with duodenitis or peptic ulcer disease. Perforation is seen along the superior aspect of the third portion of the duodenum which connects to adjacent fluid collection described on prior exam, best seen on image numbers 28 and 29 of the coronal reconstructions. This collection is enlarged compared to prior exam, now measuring 6.1 x 3.8 x 2.4 cm. It contains fluid and gas. It is seen to extend into the inferior and anterior portion of the right perinephric space where it is immediately adjacent to the lower pole of the right kidney. It demonstrates peripheral enhancement suggesting developing abscess in this area. Urinary bladder and uterus appear normal. Mild anasarca is noted.  IMPRESSION: Bilateral pleural effusions are noted with adjacent subsegmental atelectasis, right greater than left.  There is continued severe inflammation involving the second and third portion of the duodenum secondary to duodenitis or peptic ulcer disease. Perforation is seen  involving the superior aspect of the third portion the duodenum which extends into previously described fluid collection adjacent to the duodenum and posterior to the gallbladder. This fluid collection is increased in size, now measuring 6.1 x 3.8 x 2.4 cm. It is seen to extend into the inferior portion of the right perinephric base as noted on prior exam, but now demonstrates peripherally enhancing walls consistent with developing abscess anterior to the lower pole of the right kidney. These results will be called to the ordering clinician or representative by the Radiologist Assistant, and communication documented in the PACS or zVision Dashboard.   Electronically Signed   By: JDionne AnoD.  On: 02/05/2014 08:29    Review of Systems  Constitutional: Positive for weight loss. Negative for fever.  Respiratory: Negative for shortness of breath.   Cardiovascular: Negative for chest pain.  Gastrointestinal: Positive for nausea and abdominal pain.  Musculoskeletal: Negative for back pain.  Neurological: Positive for weakness.    Blood pressure 131/83, pulse 75, temperature 98.2 F (36.8 C), temperature source Oral, resp. rate 18, height _0  (1.626 m), weight 70.308 kg (155 lb), last menstrual period 06/12/2007, SpO2 99.00%. Physical Exam  Constitutional: She is oriented to person, place, and time. She appears well-developed.  Cardiovascular: Normal rate and regular rhythm.   No murmur heard. Respiratory: Effort normal and breath sounds normal. She has no wheezes.  GI: Soft. Bowel sounds are normal. There is tenderness.  Musculoskeletal: Normal range of motion.  Neurological: She is alert and oriented to person, place, and time.  Skin: Skin is warm and dry.  Psychiatric: She has a normal mood and affect. Her behavior is normal. Judgment and thought content normal.     Assessment/Plan RP duodenal perforation Now with abscess collection Scheduled for drain placement in IR Pt aware of  procedure benefits and risks and agreeable to  Proceed Consent signed andin chart  Yaniv Lage A 02/05/2014, 11:40 AM

## 2014-02-06 LAB — BASIC METABOLIC PANEL
Anion gap: 10 (ref 5–15)
BUN: 14 mg/dL (ref 6–23)
CHLORIDE: 99 meq/L (ref 96–112)
CO2: 26 mEq/L (ref 19–32)
Calcium: 8.4 mg/dL (ref 8.4–10.5)
Creatinine, Ser: 0.47 mg/dL — ABNORMAL LOW (ref 0.50–1.10)
GFR calc Af Amer: >90 mL/min (ref >90)
GFR calc non Af Amer: >90 mL/min (ref >90)
GLUCOSE: 110 mg/dL — AB (ref 70–99)
POTASSIUM: 4.4 meq/L (ref 3.7–5.3)
Sodium: 135 mEq/L — ABNORMAL LOW (ref 137–147)

## 2014-02-06 MED ORDER — FAT EMULSION 20 % IV EMUL
250.0000 mL | INTRAVENOUS | Status: AC
  Administered 2014-02-06: 250 mL via INTRAVENOUS
  Filled 2014-02-06: qty 250

## 2014-02-06 MED ORDER — ENOXAPARIN SODIUM 40 MG/0.4ML ~~LOC~~ SOLN
40.0000 mg | SUBCUTANEOUS | Status: DC
  Administered 2014-02-06 – 2014-02-16 (×11): 40 mg via SUBCUTANEOUS
  Filled 2014-02-06 (×12): qty 0.4

## 2014-02-06 MED ORDER — TRACE MINERALS CR-CU-F-FE-I-MN-MO-SE-ZN IV SOLN
65.0000 mL/h | INTRAVENOUS | Status: AC
  Administered 2014-02-06: 65 mL/h via INTRAVENOUS
  Filled 2014-02-06: qty 2000

## 2014-02-06 NOTE — Progress Notes (Signed)
Subjective: Patient states she is doing better today with controlled pain. She does admit to some RLQ tenderness with ambulation, but denies any worsening resting pain. She denies any fever or chills. She is not on an oral diet yet, but denies any N/V.  Objective: Physical Exam: BP 113/63  Pulse 69  Temp(Src) 98.2 F (36.8 C) (Oral)  Resp 18  Ht 5\' 4"  (1.626 m)  Wt 146 lb 2.6 oz (66.3 kg)  BMI 25.08 kg/m2  SpO2 98%  LMP 06/12/2007  General: A&Ox3, NAD Abd: Soft, ND, RLQ tenderness, RLQ perc drain intact with tan purulent drainage in bag, 24 hr output 30 cc, Cx gram negative rods/pending  Labs: CBC  Recent Labs  02/05/14 0505  WBC 7.2  HGB 9.9*  HCT 29.4*  PLT 268   BMET  Recent Labs  02/05/14 0505 02/06/14 0830  NA 137 135*  K 3.7 4.4  CL 101 99  CO2 27 26  GLUCOSE 120* 110*  BUN 11 14  CREATININE 0.46* 0.47*  CALCIUM 8.5 8.4   LFT  Recent Labs  02/04/14 0522  PROT 5.6*  ALBUMIN 2.3*  AST 32  ALT 28  ALKPHOS 166*  BILITOT 0.6   PT/INR  Recent Labs  02/05/14 1130  LABPROT 13.4  INR 1.02     Studies/Results: Ct Abdomen Pelvis W Contrast  02/05/2014   CLINICAL DATA:  Possible duodenum perforation.  Abdominal pain.  EXAM: CT ABDOMEN AND PELVIS WITH CONTRAST  TECHNIQUE: Multidetector CT imaging of the abdomen and pelvis was performed using the standard protocol following bolus administration of intravenous contrast.  CONTRAST:  132mL OMNIPAQUE IOHEXOL 300 MG/ML  SOLN  COMPARISON:  CT scan of January 30, 2014.  FINDINGS: Mild bilateral pleural effusions with adjacent subsegmental atelectasis are noted with right greater than left. No definite osseous abnormality is noted.  Sludge is noted within the gallbladder. No focal abnormality is noted in the liver, spleen or pancreas. Adrenal glands appear normal. No hydronephrosis or renal obstruction is noted. There is no evidence of bowel obstruction. There is again noted severe wall thickening and surrounding  inflammation involving the second and third portions of the duodenum consistent with duodenitis or peptic ulcer disease. Perforation is seen along the superior aspect of the third portion of the duodenum which connects to adjacent fluid collection described on prior exam, best seen on image numbers 28 and 29 of the coronal reconstructions. This collection is enlarged compared to prior exam, now measuring 6.1 x 3.8 x 2.4 cm. It contains fluid and gas. It is seen to extend into the inferior and anterior portion of the right perinephric space where it is immediately adjacent to the lower pole of the right kidney. It demonstrates peripheral enhancement suggesting developing abscess in this area. Urinary bladder and uterus appear normal. Mild anasarca is noted.  IMPRESSION: Bilateral pleural effusions are noted with adjacent subsegmental atelectasis, right greater than left.  There is continued severe inflammation involving the second and third portion of the duodenum secondary to duodenitis or peptic ulcer disease. Perforation is seen involving the superior aspect of the third portion the duodenum which extends into previously described fluid collection adjacent to the duodenum and posterior to the gallbladder. This fluid collection is increased in size, now measuring 6.1 x 3.8 x 2.4 cm. It is seen to extend into the inferior portion of the right perinephric base as noted on prior exam, but now demonstrates peripherally enhancing walls consistent with developing abscess anterior to the lower pole  of the right kidney. These results will be called to the ordering clinician or representative by the Radiologist Assistant, and communication documented in the PACS or zVision Dashboard.   Electronically Signed   By: Sabino Dick M.D.   On: 02/05/2014 08:29   Ct Image Guided Drainage By Percutaneous Catheter  02/05/2014   INDICATION: History of contained duodenal perforation with abscess formation. Please perform CT-guided  percutaneous drainage catheter placement.  EXAM: CT IMAGE GUIDED DRAINAGE BY PERCUTANEOUS CATHETER  COMPARISON:  CT abdomen pelvis - 01/30/2014; 02/05/2014  MEDICATIONS: The patient is currently admitted to the hospital and receiving intravenous antibiotics. The antibiotics were administered within an appropriate time frame prior to the initiation of the procedure.  ANESTHESIA/SEDATION: Fentanyl 100 mcg IV; Versed 3 mg IV  Total Moderate Sedation time  10 minutes  CONTRAST:  None  COMPLICATIONS: None immediate  PROCEDURE: Informed written consent was obtained from the patient after a discussion of the risks, benefits and alternatives to treatment. The patient was placed supine on the CT gantry and a pre procedural CT was performed re-demonstrating the known ill-defined abscess/fluid collection within the superior aspect of the right retroperitoneum adjacent to the descending portion of the duodenum (representative images 23, 28 and 33, series 3). The procedure was planned. A timeout was performed prior to the initiation of the procedure.  The right lateral abdomen was prepped and draped in the usual sterile fashion. The overlying soft tissues were anesthetized with 1% lidocaine with epinephrine. Appropriate trajectory was planned with the use of a 22 gauge spinal needle. An 18 gauge trocar needle was advanced into the abscess/fluid collection and a short Amplatz super stiff wire was coiled within the collection. Appropriate positioning was confirmed with a limited CT scan. The tract was serially dilated allowing placement of a 10 [place] all-purpose drainage catheter. Appropriate positioning was confirmed with a limited postprocedural CT scan.  Following percutaneous drainage catheter placement, approximately 10 ml of purulent fluid was aspirated. The tube was connected to a drainage bag and sutured in place. A dressing was placed. The patient tolerated the procedure well without immediate post procedural  complication.  IMPRESSION: Successful CT guided placement of a 10 French all purpose drain catheter into the ill-defined abscess adjacent to the duodenum within the superior aspect of the right retroperitoneum with aspiration of approximately 10 mL of purulent fluid. Samples were sent to the laboratory as requested by the ordering clinical team.   Electronically Signed   By: Sandi Mariscal M.D.   On: 02/05/2014 15:58    Assessment/Plan: Retroperitoneal duodenal perforation, on TPN and Primaxin RP abscess s/p percutaneous drain placed 8/28 with tan purulent output, Cx gram neg rods/pending, wbc wnl, afebrile.  Continue to monitor daily output and flush drain TID, repeat imaging prior to removal when output < 15 cc Plans per [facility]    LOS: 8 days    Hedy Jacob PA-C 02/06/2014 11:35 AM

## 2014-02-06 NOTE — Progress Notes (Signed)
Subjective: Stable and alert. So she's more comfortable. Minimal nausea. No vomiting. Baseline underwent percutaneous drainage of retroperitoneal abscess yesterday. Small-volume purulent drainage in bag, nonicteric. Cultures show gram-negative rods. On Primaxin and TNA.  Objective: Vital signs in last 24 hours: Temp:  [98 F (36.7 C)-98.6 F (37 C)] 98.2 F (36.8 C) (08/29 0649) Pulse Rate:  [69-96] 69 (08/29 0649) Resp:  [12-23] 18 (08/29 0649) BP: (104-129)/(56-73) 113/63 mmHg (08/29 0649) SpO2:  [91 %-99 %] 98 % (08/29 0649) Last BM Date: 02/05/14  Intake/Output from previous day: 08/28 0701 - 08/29 0700 In: 1625 [I.V.:190; TPN:1435] Out: 630 [Urine:600; Drains:30] Intake/Output this shift:    General appearance: looks good. Pleasant and cooperative. Mental status normal. No distress. Resp: clear to auscultation bilaterally GI: abdomen soft. Nondistended. Nontender. No mass. Right-sided drain with purulent fluid. Non-enteric.  Lab Results:   Recent Labs  02/05/14 0505  WBC 7.2  HGB 9.9*  HCT 29.4*  PLT 268   BMET  Recent Labs  02/04/14 0522 02/05/14 0505  NA 141 137  K 3.6* 3.7  CL 105 101  CO2 24 27  GLUCOSE 106* 120*  BUN 11 11  CREATININE 0.46* 0.46*  CALCIUM 8.2* 8.5   PT/INR  Recent Labs  02/05/14 1130  LABPROT 13.4  INR 1.02   ABG No results found for this basename: PHART, PCO2, PO2, HCO3,  in the last 72 hours  Studies/Results: Ct Abdomen Pelvis W Contrast  02/05/2014   CLINICAL DATA:  Possible duodenum perforation.  Abdominal pain.  EXAM: CT ABDOMEN AND PELVIS WITH CONTRAST  TECHNIQUE: Multidetector CT imaging of the abdomen and pelvis was performed using the standard protocol following bolus administration of intravenous contrast.  CONTRAST:  176mL OMNIPAQUE IOHEXOL 300 MG/ML  SOLN  COMPARISON:  CT scan of January 30, 2014.  FINDINGS: Mild bilateral pleural effusions with adjacent subsegmental atelectasis are noted with right greater  than left. No definite osseous abnormality is noted.  Sludge is noted within the gallbladder. No focal abnormality is noted in the liver, spleen or pancreas. Adrenal glands appear normal. No hydronephrosis or renal obstruction is noted. There is no evidence of bowel obstruction. There is again noted severe wall thickening and surrounding inflammation involving the second and third portions of the duodenum consistent with duodenitis or peptic ulcer disease. Perforation is seen along the superior aspect of the third portion of the duodenum which connects to adjacent fluid collection described on prior exam, best seen on image numbers 28 and 29 of the coronal reconstructions. This collection is enlarged compared to prior exam, now measuring 6.1 x 3.8 x 2.4 cm. It contains fluid and gas. It is seen to extend into the inferior and anterior portion of the right perinephric space where it is immediately adjacent to the lower pole of the right kidney. It demonstrates peripheral enhancement suggesting developing abscess in this area. Urinary bladder and uterus appear normal. Mild anasarca is noted.  IMPRESSION: Bilateral pleural effusions are noted with adjacent subsegmental atelectasis, right greater than left.  There is continued severe inflammation involving the second and third portion of the duodenum secondary to duodenitis or peptic ulcer disease. Perforation is seen involving the superior aspect of the third portion the duodenum which extends into previously described fluid collection adjacent to the duodenum and posterior to the gallbladder. This fluid collection is increased in size, now measuring 6.1 x 3.8 x 2.4 cm. It is seen to extend into the inferior portion of the right perinephric base as noted  on prior exam, but now demonstrates peripherally enhancing walls consistent with developing abscess anterior to the lower pole of the right kidney. These results will be called to the ordering clinician or  representative by the Radiologist Assistant, and communication documented in the PACS or zVision Dashboard.   Electronically Signed   By: Sabino Dick M.D.   On: 02/05/2014 08:29   Ct Image Guided Drainage By Percutaneous Catheter  02/05/2014   INDICATION: History of contained duodenal perforation with abscess formation. Please perform CT-guided percutaneous drainage catheter placement.  EXAM: CT IMAGE GUIDED DRAINAGE BY PERCUTANEOUS CATHETER  COMPARISON:  CT abdomen pelvis - 01/30/2014; 02/05/2014  MEDICATIONS: The patient is currently admitted to the hospital and receiving intravenous antibiotics. The antibiotics were administered within an appropriate time frame prior to the initiation of the procedure.  ANESTHESIA/SEDATION: Fentanyl 100 mcg IV; Versed 3 mg IV  Total Moderate Sedation time  10 minutes  CONTRAST:  None  COMPLICATIONS: None immediate  PROCEDURE: Informed written consent was obtained from the patient after a discussion of the risks, benefits and alternatives to treatment. The patient was placed supine on the CT gantry and a pre procedural CT was performed re-demonstrating the known ill-defined abscess/fluid collection within the superior aspect of the right retroperitoneum adjacent to the descending portion of the duodenum (representative images 23, 28 and 33, series 3). The procedure was planned. A timeout was performed prior to the initiation of the procedure.  The right lateral abdomen was prepped and draped in the usual sterile fashion. The overlying soft tissues were anesthetized with 1% lidocaine with epinephrine. Appropriate trajectory was planned with the use of a 22 gauge spinal needle. An 18 gauge trocar needle was advanced into the abscess/fluid collection and a short Amplatz super stiff wire was coiled within the collection. Appropriate positioning was confirmed with a limited CT scan. The tract was serially dilated allowing placement of a 10 [place] all-purpose drainage catheter.  Appropriate positioning was confirmed with a limited postprocedural CT scan.  Following percutaneous drainage catheter placement, approximately 10 ml of purulent fluid was aspirated. The tube was connected to a drainage bag and sutured in place. A dressing was placed. The patient tolerated the procedure well without immediate post procedural complication.  IMPRESSION: Successful CT guided placement of a 10 French all purpose drain catheter into the ill-defined abscess adjacent to the duodenum within the superior aspect of the right retroperitoneum with aspiration of approximately 10 mL of purulent fluid. Samples were sent to the laboratory as requested by the ordering clinical team.   Electronically Signed   By: Sandi Mariscal M.D.   On: 02/05/2014 15:58    Anti-infectives: Anti-infectives   Start     Dose/Rate Route Frequency Ordered Stop   01/30/14 0800  imipenem-cilastatin (PRIMAXIN) 250 mg in sodium chloride 0.9 % 100 mL IVPB     250 mg 200 mL/hr over 30 Minutes Intravenous Every 6 hours 01/30/14 0740        Assessment/Plan:  Retroperitoneal duodenal perforation  Status post successful percutaneous drainage of right retroperitoneal abscess yesterday. Continue nonoperative management approach -Primaxin -TPN  -mobilize/IS  -pain control/anti-emetics  -restart lovenox for VTE prophylaxix -protonix BI  -? Check for h pylori   Hypokalemia-supplement 76mEq along with lasix  Check bmet  Fluid overload-diuresed 2 liters, still +7L. given 40mg  IV lasix yesterday.     LOS: 8 days    Marwa Fuhrman M 02/06/2014

## 2014-02-07 DIAGNOSIS — M810 Age-related osteoporosis without current pathological fracture: Secondary | ICD-10-CM | POA: Insufficient documentation

## 2014-02-07 MED ORDER — FAT EMULSION 20 % IV EMUL
250.0000 mL | INTRAVENOUS | Status: AC
  Administered 2014-02-07: 250 mL via INTRAVENOUS
  Filled 2014-02-07: qty 250

## 2014-02-07 MED ORDER — TRACE MINERALS CR-CU-F-FE-I-MN-MO-SE-ZN IV SOLN
65.0000 mL/h | INTRAVENOUS | Status: AC
  Administered 2014-02-07: 65 mL/h via INTRAVENOUS
  Filled 2014-02-07: qty 2000

## 2014-02-07 NOTE — Progress Notes (Signed)
PARENTERAL NUTRITION/ANTIBIOTIC CONSULT NOTE - Follow-up  Pharmacy Consult for TPN and Primaxin Indication: Duodenal perforation  Allergies  Allergen Reactions  . Penicillins     Some forms of penicillin  . Amoxicillin Rash  . Other Rash    Patient cannot remember name of medication but knows it was an antibiotic used to treat UTI    Patient Measurements: Height: 5' 4" (162.6 cm) Weight: 146 lb 2.6 oz (66.3 kg) IBW/kg (Calculated) : 54.7 Adjusted Body Weight: 56.6kg Usual Weight: 58 kg  Vital Signs: Temp: 98.2 F (36.8 C) (08/30 0638) BP: 114/60 mmHg (08/30 0638) Pulse Rate: 72 (08/30 0638) Intake/Output from previous day: 08/29 0701 - 08/30 0700 In: 685 [I.V.:80; TPN:600] Out: 30 [Drains:30] Intake/Output from this shift:    Labs:  Recent Labs  02/05/14 0505 02/05/14 1130  WBC 7.2  --   HGB 9.9*  --   HCT 29.4*  --   PLT 268  --   APTT  --  34  INR  --  1.02     Recent Labs  02/05/14 0505 02/06/14 0830  NA 137 135*  K 3.7 4.4  CL 101 99  CO2 27 26  GLUCOSE 120* 110*  BUN 11 14  CREATININE 0.46* 0.47*  CALCIUM 8.5 8.4   Estimated Creatinine Clearance: 68.3 ml/min (by C-G formula based on Cr of 0.47).    Recent Labs  02/04/14 1823 02/04/14 2353 02/05/14 0616  GLUCAP 118* 125* 116*   Insulin Requirements in the past 24 hours:  None -  CBGs discontinued  Current Nutrition: NPO Clinimix E 5/15 at 65 ml/hr and 20% lipid emulsion at 10ml/hr provides 1588 kcal and 78 gm protein  Nutritional Goals:  1500-1700 kCal, 70-80 grams of protein per day (per RD note 8/24)  Assessment: Admit: Admitted with diffuse abdominal pain found to have perforated duodenum. No significant PMH.  GI: Bowel rest/TPN for retroperitoneal duodenal perforation. +flatus. CT today to evaluate for leak. Prealbumin 5.5 (low at baseline).   Endo: No h/o DM. D/c empiric sensitive SSI/CBGs as minimal insulin requirements on goal TPN.   Lytes: Na 135, K 4.4  Renal: SCr  stable. MIVF: D5NS w/ 40 KCl at 10ml/hr. Wt up to 146lb (from 134 lb on admit). I/O not being charted accurately.  Pulm: RA  Cards: No hx. VSS  Hepatobil: Alk phos slightly elevated, other LFTs wnl. TG 105  Neuro: A&O  ID: Primaxin D#8 for perforated duodenum. Afebrile. Wbc wnl.  Hem/Onc: Hgb low but stable, plt ok.     Best Practices: SQ heparin, PPI IV  TPN Access: PICC 8/24  TPN day#: 5  Plan:  1. Continue Clinimix E 5/15 65ml/hr and 20% lipid emulsion 10ml/hr. This meets 100% pt's estimated needs. 2. Trace elements and MVI daily to TPN. 3. TPN labs in AM 4. Continue Primaxin 250mg IV q6h.  5. Will continue to follow renal function, pt's clinical condition, antibiotic length of therapy  Jeremy Frens, PharmD, BCPS Clinical Pharmacist Pager [phone]   02/07/2014  10:19 AM      

## 2014-02-07 NOTE — Progress Notes (Signed)
Subjective: Stable and alert. Comfortable. She'll have some right-sided pain. Nausea resolved.  Percutaneous drain output 30 cc last 24 hours. Still looks purulent. Culture still showed an 80 rods, no idea. On Primaxin and T&A  Objective: Vital signs in last 24 hours: Temp:  [98.2 F (36.8 C)-98.5 F (36.9 C)] 98.2 F (36.8 C) (08/30 3710) Pulse Rate:  [72-89] 72 (08/30 0638) Resp:  [18] 18 (08/30 0638) BP: (114-118)/(60-63) 114/60 mmHg (08/30 0638) SpO2:  [98 %-100 %] 100 % (08/30 6269) Weight:  [146 lb 2.6 oz (66.3 kg)] 146 lb 2.6 oz (66.3 kg) (08/29 1050) Last BM Date: 02/05/14  Intake/Output from previous day: 08/29 0701 - 08/30 0700 In: 685 [I.V.:80; TPN:600] Out: 30 [Drains:30] Intake/Output this shift:    EXAM: General appearance: looks good. Pleasant and cooperative. Mental status normal. No distress.  Resp: clear to auscultation bilaterally  GI: abdomen soft. Nondistended. Subjectively tender right upper quadrant right flank.. No mass. Right-sided drain with purulent fluid. Non-enteric    Lab Results:   Recent Labs  02/05/14 0505  WBC 7.2  HGB 9.9*  HCT 29.4*  PLT 268   BMET  Recent Labs  02/05/14 0505 02/06/14 0830  NA 137 135*  K 3.7 4.4  CL 101 99  CO2 27 26  GLUCOSE 120* 110*  BUN 11 14  CREATININE 0.46* 0.47*  CALCIUM 8.5 8.4   PT/INR  Recent Labs  02/05/14 1130  LABPROT 13.4  INR 1.02   ABG No results found for this basename: PHART, PCO2, PO2, HCO3,  in the last 72 hours  Studies/Results: Ct Image Guided Drainage By Percutaneous Catheter  02/05/2014   INDICATION: History of contained duodenal perforation with abscess formation. Please perform CT-guided percutaneous drainage catheter placement.  EXAM: CT IMAGE GUIDED DRAINAGE BY PERCUTANEOUS CATHETER  COMPARISON:  CT abdomen pelvis - 01/30/2014; 02/05/2014  MEDICATIONS: The patient is currently admitted to the hospital and receiving intravenous antibiotics. The antibiotics were  administered within an appropriate time frame prior to the initiation of the procedure.  ANESTHESIA/SEDATION: Fentanyl 100 mcg IV; Versed 3 mg IV  Total Moderate Sedation time  10 minutes  CONTRAST:  None  COMPLICATIONS: None immediate  PROCEDURE: Informed written consent was obtained from the patient after a discussion of the risks, benefits and alternatives to treatment. The patient was placed supine on the CT gantry and a pre procedural CT was performed re-demonstrating the known ill-defined abscess/fluid collection within the superior aspect of the right retroperitoneum adjacent to the descending portion of the duodenum (representative images 23, 28 and 33, series 3). The procedure was planned. A timeout was performed prior to the initiation of the procedure.  The right lateral abdomen was prepped and draped in the usual sterile fashion. The overlying soft tissues were anesthetized with 1% lidocaine with epinephrine. Appropriate trajectory was planned with the use of a 22 gauge spinal needle. An 18 gauge trocar needle was advanced into the abscess/fluid collection and a short Amplatz super stiff wire was coiled within the collection. Appropriate positioning was confirmed with a limited CT scan. The tract was serially dilated allowing placement of a 10 [place] all-purpose drainage catheter. Appropriate positioning was confirmed with a limited postprocedural CT scan.  Following percutaneous drainage catheter placement, approximately 10 ml of purulent fluid was aspirated. The tube was connected to a drainage bag and sutured in place. A dressing was placed. The patient tolerated the procedure well without immediate post procedural complication.  IMPRESSION: Successful CT guided placement of a 10  [place] all purpose drain catheter into the ill-defined abscess adjacent to the duodenum within the superior aspect of the right retroperitoneum with aspiration of approximately 10 mL of purulent fluid. Samples were sent to  the laboratory as requested by the ordering clinical team.   Electronically Signed   By: Sandi Mariscal M.D.   On: 02/05/2014 15:58    Anti-infectives: Anti-infectives   Start     Dose/Rate Route Frequency Ordered Stop   01/30/14 0800  imipenem-cilastatin (PRIMAXIN) 250 mg in sodium chloride 0.9 % 100 mL IVPB     250 mg 200 mL/hr over 30 Minutes Intravenous Every 6 hours 01/30/14 0740        Assessment/Plan:   Retroperitoneal duodenal perforation  Status post successful percutaneous drainage of right retroperitoneal abscess -02/05/2014 Continue nonoperative management approach  -Primaxin  -TPN  Check cultures. -mobilize/IS  -pain control/anti-emetics  -on lovenox for VTE prophylaxix  -protonix BID -? Check for h pylori   Hypokalemia-supplement 50mEq along with lasix  Resolved.  K 4.4 today  Fluid overload-diuresed 2 liters, still +7L. given 40mg  IV lasix on 02/05/2014. Seems euvolemic today.        LOS: 9 days    Sang Blount M 02/07/2014

## 2014-02-08 LAB — COMPREHENSIVE METABOLIC PANEL
ALK PHOS: 189 U/L — AB (ref 39–117)
ALT: 69 U/L — ABNORMAL HIGH (ref 0–35)
AST: 53 U/L — ABNORMAL HIGH (ref 0–37)
Albumin: 2.8 g/dL — ABNORMAL LOW (ref 3.5–5.2)
Anion gap: 9 (ref 5–15)
BUN: 16 mg/dL (ref 6–23)
CO2: 27 mEq/L (ref 19–32)
Calcium: 8.7 mg/dL (ref 8.4–10.5)
Chloride: 99 mEq/L (ref 96–112)
Creatinine, Ser: 0.5 mg/dL (ref 0.50–1.10)
GLUCOSE: 124 mg/dL — AB (ref 70–99)
POTASSIUM: 4.1 meq/L (ref 3.7–5.3)
Sodium: 135 mEq/L — ABNORMAL LOW (ref 137–147)
Total Bilirubin: 0.3 mg/dL (ref 0.3–1.2)
Total Protein: 6.4 g/dL (ref 6.0–8.3)

## 2014-02-08 LAB — DIFFERENTIAL
BASOS ABS: 0 10*3/uL (ref 0.0–0.1)
Basophils Relative: 0 % (ref 0–1)
EOS ABS: 0.3 10*3/uL (ref 0.0–0.7)
EOS PCT: 3 % (ref 0–5)
LYMPHS ABS: 0.8 10*3/uL (ref 0.7–4.0)
Lymphocytes Relative: 9 % — ABNORMAL LOW (ref 12–46)
Monocytes Absolute: 1 10*3/uL (ref 0.1–1.0)
Monocytes Relative: 11 % (ref 3–12)
Neutro Abs: 6.8 10*3/uL (ref 1.7–7.7)
Neutrophils Relative %: 77 % (ref 43–77)

## 2014-02-08 LAB — CULTURE, ROUTINE-ABSCESS

## 2014-02-08 LAB — CBC
HCT: 30.6 % — ABNORMAL LOW (ref 36.0–46.0)
Hemoglobin: 9.8 g/dL — ABNORMAL LOW (ref 12.0–15.0)
MCH: 29.5 pg (ref 26.0–34.0)
MCHC: 32 g/dL (ref 30.0–36.0)
MCV: 92.2 fL (ref 78.0–100.0)
PLATELETS: 354 10*3/uL (ref 150–400)
RBC: 3.32 MIL/uL — ABNORMAL LOW (ref 3.87–5.11)
RDW: 13.5 % (ref 11.5–15.5)
WBC: 8.9 10*3/uL (ref 4.0–10.5)

## 2014-02-08 LAB — PREALBUMIN: Prealbumin: 14.4 mg/dL — ABNORMAL LOW (ref 17.0–34.0)

## 2014-02-08 LAB — MAGNESIUM: Magnesium: 2.4 mg/dL (ref 1.5–2.5)

## 2014-02-08 LAB — TRIGLYCERIDES: Triglycerides: 68 mg/dL (ref <150)

## 2014-02-08 LAB — PHOSPHORUS: Phosphorus: 3.7 mg/dL (ref 2.3–4.6)

## 2014-02-08 MED ORDER — HYDROMORPHONE HCL PF 1 MG/ML IJ SOLN
1.0000 mg | INTRAMUSCULAR | Status: DC | PRN
  Administered 2014-02-08 (×2): 1 mg via INTRAVENOUS
  Administered 2014-02-08 (×4): 2 mg via INTRAVENOUS
  Administered 2014-02-08: 1 mg via INTRAVENOUS
  Administered 2014-02-08 – 2014-02-11 (×20): 2 mg via INTRAVENOUS
  Administered 2014-02-11: 1 mg via INTRAVENOUS
  Administered 2014-02-11 (×2): 2 mg via INTRAVENOUS
  Administered 2014-02-11 – 2014-02-12 (×2): 1 mg via INTRAVENOUS
  Administered 2014-02-12: 2 mg via INTRAVENOUS
  Administered 2014-02-12 – 2014-02-13 (×3): 1 mg via INTRAVENOUS
  Filled 2014-02-08 (×7): qty 2
  Filled 2014-02-08: qty 1
  Filled 2014-02-08: qty 2
  Filled 2014-02-08 (×2): qty 1
  Filled 2014-02-08: qty 2
  Filled 2014-02-08: qty 1
  Filled 2014-02-08 (×5): qty 2
  Filled 2014-02-08 (×2): qty 1
  Filled 2014-02-08 (×5): qty 2
  Filled 2014-02-08: qty 1
  Filled 2014-02-08 (×11): qty 2

## 2014-02-08 MED ORDER — TRACE MINERALS CR-CU-F-FE-I-MN-MO-SE-ZN IV SOLN
65.0000 mL/h | INTRAVENOUS | Status: AC
  Administered 2014-02-08: 65 mL/h via INTRAVENOUS
  Filled 2014-02-08: qty 2000

## 2014-02-08 MED ORDER — FAT EMULSION 20 % IV EMUL
250.0000 mL | INTRAVENOUS | Status: AC
  Administered 2014-02-08: 250 mL via INTRAVENOUS
  Filled 2014-02-08: qty 250

## 2014-02-08 MED ORDER — HYDROMORPHONE HCL PF 1 MG/ML IJ SOLN
INTRAMUSCULAR | Status: AC
  Filled 2014-02-08: qty 1

## 2014-02-08 NOTE — Progress Notes (Signed)
Agree with assessment.  Consider repeat CT soon.

## 2014-02-08 NOTE — Progress Notes (Signed)
PARENTERAL NUTRITION/ANTIBIOTIC CONSULT NOTE - Follow-up  Pharmacy Consult for TPN and Primaxin Indication: Duodenal perforation  Allergies  Allergen Reactions  . Penicillins     Some forms of penicillin  . Amoxicillin Rash  . Other Rash    Patient cannot remember name of medication but knows it was an antibiotic used to treat UTI    Patient Measurements: Height: '5\' 4"'  (162.6 cm) Weight: 133 lb (60.328 kg) IBW/kg (Calculated) : 54.7 Adjusted Body Weight: 56.6kg Usual Weight: 58 kg  Vital Signs: Temp: 98.1 F (36.7 C) (08/31 0644) BP: 109/55 mmHg (08/31 0644) Pulse Rate: 74 (08/31 0644) Intake/Output from previous day: 08/30 0701 - 08/31 0700 In: 795 [I.V.:70; IV Piggyback:200; TPN:525] Out: 1222 [Urine:1202; Drains:20] Intake/Output from this shift:    Labs:  Recent Labs  02/08/14 0400  WBC 8.9  HGB 9.8*  HCT 30.6*  PLT 354     Recent Labs  02/06/14 0830 02/08/14 0400  NA 135* 135*  K 4.4 4.1  CL 99 99  CO2 26 27  GLUCOSE 110* 124*  BUN 14 16  CREATININE 0.47* 0.50  CALCIUM 8.4 8.7  MG  --  2.4  PHOS  --  3.7  PROT  --  6.4  ALBUMIN  --  2.8*  AST  --  53*  ALT  --  69*  ALKPHOS  --  189*  BILITOT  --  0.3  TRIG  --  68   Estimated Creatinine Clearance: 63 ml/min (by C-G formula based on Cr of 0.5).   No results found for this basename: GLUCAP,  in the last 72 hours Insulin Requirements in the past 24 hours:  None -  CBGs discontinued  Current Nutrition: NPO Clinimix E 5/15 at 65 ml/hr and 20% lipid emulsion at 37m/hr provides 1588 kcal and 78 gm protein  Nutritional Goals:  1500-1700 kCal, 70-80 grams of protein per day (per RD note 8/24)  Assessment: Admit: Admitted with diffuse abdominal pain found to have perforated duodenum. No significant PMH. S/p RP drain placement 8/28 2nd abscess.   GI: Bowel rest/TPN for retroperitoneal duodenal perforation, s/p RP drain to abscess.  Decreasing drain output.15cc/24 hours.    Plan to get f/u  CT in next 48-72 hrs to evaluate for leak/assess adequacy of drainage.  Prealbumin 5.5 (low at baseline).   Endo: No h/o DM. D/c'd empiric sensitive SSI/CBGs as minimal insulin requirements on goal TPN.   Lytes: Na 135, K 4.1, phos 3.7, mag 2.4  Renal: SCr stable. MIVF: D5NS w/ 40 KCl at 125mhr. Wt up to 146lb (from 134 lb on admit). I/O not being charted accurately.   Hepatobil: Alk phos slightly elevated, other LFTs wnl. TG 105  ID: Primaxin D#9 for perforated duodenum. Afebrile. Wbc wnl. 8/28 abscess cx: Klebsiella pneumoniae pan senstitive  TPN Access: PICC 8/24  TPN day#: 8  Plan:  - Continue Clinimix E 5/15 6556mr and 20% lipid emulsion 70m38m. This meets 100% pt's estimated needs. -Continue Primaxin 250mg14mq6h.  - Will continue to follow renal function, pt's clinical condition, antibiotic length of therapy -next TPN labs on Thursday  MicheEudelia Bunchrm.D. [phone] 11:59 AM

## 2014-02-08 NOTE — Progress Notes (Signed)
  Subjective: Pt sitting up in chair; c/o some soreness at drain site  Objective: Vital signs in last 24 hours: Temp:  [98 F (36.7 C)-98.2 F (36.8 C)] 98.1 F (36.7 C) (08/31 0644) Pulse Rate:  [74-83] 74 (08/31 0644) Resp:  [18-19] 19 (08/31 0644) BP: (102-122)/(55-62) 109/55 mmHg (08/31 0644) SpO2:  [96 %-100 %] 98 % (08/31 0644) Weight:  [133 lb (60.328 kg)] 133 lb (60.328 kg) (08/31 0904) Last BM Date: 02/05/14  Intake/Output from previous day: 08/30 0701 - 08/31 0700 In: 795 [I.V.:70; IV Piggyback:200; TPN:525] Out: 1222 [Urine:1202; Drains:20] Intake/Output this shift:    Rt abd drain intact, insertion site ok, mildly tender; output 20 cc's cloudy beige fluid; cx's - klebsiella  Lab Results:   Recent Labs  02/08/14 0400  WBC 8.9  HGB 9.8*  HCT 30.6*  PLT 354   BMET  Recent Labs  02/06/14 0830 02/08/14 0400  NA 135* 135*  K 4.4 4.1  CL 99 99  CO2 26 27  GLUCOSE 110* 124*  BUN 14 16  CREATININE 0.47* 0.50  CALCIUM 8.4 8.7   PT/INR  Recent Labs  02/05/14 1130  LABPROT 13.4  INR 1.02   ABG No results found for this basename: PHART, PCO2, PO2, HCO3,  in the last 72 hours  Studies/Results: No results found.  Anti-infectives: Anti-infectives   Start     Dose/Rate Route Frequency Ordered Stop   01/30/14 0800  imipenem-cilastatin (PRIMAXIN) 250 mg in sodium chloride 0.9 % 100 mL IVPB     250 mg 200 mL/hr over 30 Minutes Intravenous Every 6 hours 01/30/14 0740        Assessment/Plan: s/p right RP drain placement 8/28 secondary to abscess ; with decreasing drain output despite irrigation recommend f/u CT/poss drain injection in next 48-72 hrs to assess adequacy of drainage  LOS: 10 days    Reese Stockman,D [facility] 02/08/2014

## 2014-02-08 NOTE — Progress Notes (Signed)
  Subjective: Stable and alert. Notices intermittent pain in right upper quadrant at drainage tube site when she contracts her muscles. Afebrile. Stable. Percutaneous drain output 15 cc last 24 hours. Still looks purulent. No enteric content. Cultures still growing gram-negative rods. No ID yet. Potassium 4.1. Creatinine 0.5. Glucose 124. Mild elevation of transaminases.  Objective: Vital signs in last 24 hours: Temp:  [98 F (36.7 C)-98.2 F (36.8 C)] 98 F (36.7 C) (08/31 0400) Pulse Rate:  [72-83] 79 (08/31 0400) Resp:  [18-19] 19 (08/31 0400) BP: (102-122)/(60-62) 108/60 mmHg (08/31 0400) SpO2:  [96 %-100 %] 96 % (08/31 0400) Last BM Date: 02/05/14  Intake/Output from previous day: 08/30 0701 - 08/31 0700 In: 795 [I.V.:70; IV Piggyback:200; TPN:525] Out: 917 [Urine:902; Drains:15] Intake/Output this shift: Total I/O In: -  Out: 905 [Urine:900; Drains:5]  General appearance: pale and alert. Cooperative. No distress. Mental status normal. Pleasant. GI: aabdomen soft. Nondistended. A little tender around the drain site. No mass. Bowel sounds present. Drainage purulent in character.  Lab Results:  No results found for this basename: WBC, HGB, HCT, PLT,  in the last 72 hours BMET  Recent Labs  02/06/14 0830 02/08/14 0400  NA 135* 135*  K 4.4 4.1  CL 99 99  CO2 26 27  GLUCOSE 110* 124*  BUN 14 16  CREATININE 0.47* 0.50  CALCIUM 8.4 8.7   PT/INR  Recent Labs  02/05/14 1130  LABPROT 13.4  INR 1.02   ABG No results found for this basename: PHART, PCO2, PO2, HCO3,  in the last 72 hours  Studies/Results: No results found.  Anti-infectives: Anti-infectives   Start     Dose/Rate Route Frequency Ordered Stop   01/30/14 0800  imipenem-cilastatin (PRIMAXIN) 250 mg in sodium chloride 0.9 % 100 mL IVPB     250 mg 200 mL/hr over 30 Minutes Intravenous Every 6 hours 01/30/14 0740        Assessment/Plan:   Retroperitoneal duodenal perforation, probably  D2. Status post successful percutaneous drainage of right retroperitoneal abscess -02/05/2014  Grainy slowing down. Theorized that also has sealed and Randolf abscess inadequately drained. Continue nonoperative management approach  -Primaxin  -TPN  Check cultures.  Followup CT scan early this week to assess adequacy of drainage Eventually, a Gastrografin upper GI. -on lovenox for VTE prophylaxix  -protonix BID  -? Check for h pylori   Hypokalemia-resolved  Fluid overload-  Seems euvolemic today.       LOS: 10 days    Kiira Brach M 02/08/2014

## 2014-02-09 MED ORDER — FAT EMULSION 20 % IV EMUL
250.0000 mL | INTRAVENOUS | Status: AC
  Administered 2014-02-09: 250 mL via INTRAVENOUS
  Filled 2014-02-09: qty 250

## 2014-02-09 MED ORDER — TRACE MINERALS CR-CU-F-FE-I-MN-MO-SE-ZN IV SOLN
65.0000 mL/h | INTRAVENOUS | Status: AC
  Administered 2014-02-09: 65 mL/h via INTRAVENOUS
  Filled 2014-02-09: qty 2000

## 2014-02-09 NOTE — Progress Notes (Signed)
  Subjective: Up in chair, no abdominal pain  Objective: Vital signs in last 24 hours: Temp:  [98.1 F (36.7 C)-98.4 F (36.9 C)] 98.1 F (36.7 C) (09/01 0612) Pulse Rate:  [79-89] 79 (09/01 0612) Resp:  [18-19] 18 (09/01 0612) BP: (102-111)/(51-59) 111/57 mmHg (09/01 0612) SpO2:  [96 %-98 %] 97 % (09/01 0612) Weight:  [132 lb 14.4 oz (60.283 kg)-133 lb (60.328 kg)] 132 lb 14.4 oz (60.283 kg) (09/01 0612) Last BM Date: 02/08/14  Intake/Output from previous day: 08/31 0701 - 09/01 0700 In: 675 [P.O.:120; I.V.:40; IV Piggyback:200; TPN:300] Out: 1720 [Urine:1700; Drains:20] Intake/Output this shift: Total I/O In: -  Out: 300 [Urine:300]  General appearance: alert and cooperative Resp: clear to auscultation bilaterally Cardio: regular rate and rhythm GI: soft, NT, drainage purulent  Lab Results:   Recent Labs  02/08/14 0400  WBC 8.9  HGB 9.8*  HCT 30.6*  PLT 354   BMET  Recent Labs  02/06/14 0830 02/08/14 0400  NA 135* 135*  K 4.4 4.1  CL 99 99  CO2 26 27  GLUCOSE 110* 124*  BUN 14 16  CREATININE 0.47* 0.50  CALCIUM 8.4 8.7   PT/INR No results found for this basename: LABPROT, INR,  in the last 72 hours ABG No results found for this basename: PHART, PCO2, PO2, HCO3,  in the last 72 hours  Studies/Results: No results found.  Anti-infectives: Anti-infectives   Start     Dose/Rate Route Frequency Ordered Stop   01/30/14 0800  imipenem-cilastatin (PRIMAXIN) 250 mg in sodium chloride 0.9 % 100 mL IVPB     250 mg 200 mL/hr over 30 Minutes Intravenous Every 6 hours 01/30/14 0740        Assessment/Plan: Retroperitoneal duodenal perforation, probably D2. -Status post successful percutaneous drainage of right retroperitoneal abscess -02/05/2014  -Drain study per IR next day or two -Continue nonoperative management approach  -Primaxin for Klebsiella -TPN  -Eventually, a Gastrografin upper GI. -on lovenox for VTE prophylaxix  -protonix BID   LOS: 11  days    Armond Cuthrell E 02/09/2014

## 2014-02-09 NOTE — Progress Notes (Signed)
  Subjective: RP duod perf; abscess Drain placed 8/28 Doing well afeb Wbc wnl +Kleb Output minimal---= flushing  Objective: Vital signs in last 24 hours: Temp:  [98.1 F (36.7 C)-98.4 F (36.9 C)] 98.1 F (36.7 C) (09/01 0612) Pulse Rate:  [79-89] 79 (09/01 0612) Resp:  [18-19] 18 (09/01 0612) BP: (102-111)/(51-59) 111/57 mmHg (09/01 0612) SpO2:  [96 %-98 %] 97 % (09/01 0612) Weight:  [60.283 kg (132 lb 14.4 oz)] 60.283 kg (132 lb 14.4 oz) (09/01 0612) Last BM Date: 02/08/14  Intake/Output from previous day: 08/31 0701 - 09/01 0700 In: 675 [P.O.:120; I.V.:40; IV Piggyback:200; TPN:300] Out: 1720 [Urine:1700; Drains:20] Intake/Output this shift: Total I/O In: -  Out: 300 [Urine:300]  PE:  Afeb; wbc wnl Output minimal +Kleb Site of drain clean and dry; NT   Lab Results:   Recent Labs  02/08/14 0400  WBC 8.9  HGB 9.8*  HCT 30.6*  PLT 354   BMET  Recent Labs  02/08/14 0400  NA 135*  K 4.1  CL 99  CO2 27  GLUCOSE 124*  BUN 16  CREATININE 0.50  CALCIUM 8.7   PT/INR No results found for this basename: LABPROT, INR,  in the last 72 hours ABG No results found for this basename: PHART, PCO2, PO2, HCO3,  in the last 72 hours  Studies/Results: No results found.  Anti-infectives: Anti-infectives   Start     Dose/Rate Route Frequency Ordered Stop   01/30/14 0800  imipenem-cilastatin (PRIMAXIN) 250 mg in sodium chloride 0.9 % 100 mL IVPB     250 mg 200 mL/hr over 30 Minutes Intravenous Every 6 hours 01/30/14 0740        Assessment/Plan: s/p * No surgery found * abd abscess drain placed 8/28 output = flushes Wbc wnl; afeb For drain inj 9/2 Will call Dr Obie Dredge pull drain   LOS: 11 days    Brianna Fuller 02/09/2014

## 2014-02-10 ENCOUNTER — Inpatient Hospital Stay (HOSPITAL_COMMUNITY): Payer: BC Managed Care – PPO

## 2014-02-10 LAB — CBC
HCT: 29.4 % — ABNORMAL LOW (ref 36.0–46.0)
Hemoglobin: 9.7 g/dL — ABNORMAL LOW (ref 12.0–15.0)
MCH: 30.9 pg (ref 26.0–34.0)
MCHC: 33 g/dL (ref 30.0–36.0)
MCV: 93.6 fL (ref 78.0–100.0)
Platelets: 323 10*3/uL (ref 150–400)
RBC: 3.14 MIL/uL — AB (ref 3.87–5.11)
RDW: 13.3 % (ref 11.5–15.5)
WBC: 7.8 10*3/uL (ref 4.0–10.5)

## 2014-02-10 LAB — BASIC METABOLIC PANEL
ANION GAP: 10 (ref 5–15)
BUN: 16 mg/dL (ref 6–23)
CALCIUM: 8.5 mg/dL (ref 8.4–10.5)
CO2: 27 mEq/L (ref 19–32)
Chloride: 98 mEq/L (ref 96–112)
Creatinine, Ser: 0.52 mg/dL (ref 0.50–1.10)
GFR calc non Af Amer: >90 mL/min (ref >90)
Glucose, Bld: 122 mg/dL — ABNORMAL HIGH (ref 70–99)
Potassium: 4.4 mEq/L (ref 3.7–5.3)
Sodium: 135 mEq/L — ABNORMAL LOW (ref 137–147)

## 2014-02-10 MED ORDER — FAT EMULSION 20 % IV EMUL
250.0000 mL | INTRAVENOUS | Status: DC
  Administered 2014-02-10: 250 mL via INTRAVENOUS
  Filled 2014-02-10: qty 250

## 2014-02-10 MED ORDER — TRACE MINERALS CR-CU-F-FE-I-MN-MO-SE-ZN IV SOLN
65.0000 mL/h | INTRAVENOUS | Status: DC
  Administered 2014-02-10: 65 mL/h via INTRAVENOUS
  Filled 2014-02-10: qty 2000

## 2014-02-10 MED ORDER — IOHEXOL 300 MG/ML  SOLN
50.0000 mL | Freq: Once | INTRAMUSCULAR | Status: AC | PRN
  Administered 2014-02-10: 15 mL via INTRAVENOUS

## 2014-02-10 NOTE — Progress Notes (Signed)
PARENTERAL NUTRITION CONSULT NOTE - FOLLOW UP  Pharmacy Consult:  TPN Indication: Duodenal perforation  Allergies  Allergen Reactions  . Penicillins     Some forms of penicillin  . Amoxicillin Rash  . Other Rash    Patient cannot remember name of medication but knows it was an antibiotic used to treat UTI    Patient Measurements: Height: 5\' 4"  (162.6 cm) Weight: 132 lb 0.9 oz (59.9 kg) IBW/kg (Calculated) : 54.7 Adjusted Body Weight: 56.6kg Usual Weight: 58 kg  Vital Signs: Temp: 98.1 F (36.7 C) (09/02 0654) Temp src: Oral (09/02 0654) BP: 110/66 mmHg (09/02 0654) Pulse Rate: 86 (09/02 0654) Intake/Output from previous day: 09/01 0701 - 09/02 0700 In: 1299.2 [I.V.:318.2; TPN:976] Out: 1110 [Urine:1100; Drains:10]  Labs:  Recent Labs  02/08/14 0400 02/10/14 0005  WBC 8.9 7.8  HGB 9.8* 9.7*  HCT 30.6* 29.4*  PLT 354 323     Recent Labs  02/08/14 0400 02/10/14 0005  NA 135* 135*  K 4.1 4.4  CL 99 98  CO2 27 27  GLUCOSE 124* 122*  BUN 16 16  CREATININE 0.50 0.52  CALCIUM 8.7 8.5  MG 2.4  --   PHOS 3.7  --   PROT 6.4  --   ALBUMIN 2.8*  --   AST 53*  --   ALT 69*  --   ALKPHOS 189*  --   BILITOT 0.3  --   PREALBUMIN 14.4*  --   TRIG 68  --    Estimated Creatinine Clearance: 63 ml/min (by C-G formula based on Cr of 0.52).   No results found for this basename: GLUCAP,  in the last 72 hours    Insulin Requirements in the past 24 hours:  None - SS/CBGs d/c'ed  Assessment: Admitted with diffuse abdominal pain and found to have perforated duodenum. No significant PMH.  S/p RP drain placement on 02/05/14 secondary to abscess.  Patient is tolerating TPN.   GI: bowel rest/TPN for retroperitoneal duodenal perforation, s/p RP drain to abscess >> O/P minimal so may pull drain.  Plan to get f/u CT in next 48-72 hrs to evaluate for leak/assess adequacy of drainage.  9/2 imaging consistent with duodenum perforation.  Prealbumin 5.5 >> 14.4; weight 134 lbs  on admit >> now 146 lbs Endo: no hx DM - SSI/CBGs d/c'ed as minimal insulin requirements Lytes: low Na, others WNL Renal: SCr stable - D5NS with 40 KCL at 36ml/hr, good UOP 0.8 ml/hr/hr Cards: VSS Hepatobil: LFTs mildly elevated, tbili WNL.  TG WNL. ID: Primaxin D#10 for perforated duodenum - afebrile, WBC WNL.  8/28 abscess cx: Klebsiella pneumoniae pan sensitive >> ID PharmD left recommendation for de-escalation TPN Access: PICC 02/01/14 TPN day#: 9  Current Nutrition:  TPN  Nutritional Goals:  1500-1700 kCal, 70-80 grams of protein per day    Plan:  - Continue Clinimix E 5/15 at 67ml/hr and IVFE at 28ml/hr, meeting 100% of patient;s estimated needs - Daily multivitamin and trace elements - F/U AM labs - Continue Primaxin 250mg  IV Q6H - Monitor renal fxn, abx de-escalation    Dejanique Ruehl D. Mina Marble, PharmD, BCPS Pager:  [phone] 02/10/2014, 10:55 AM

## 2014-02-10 NOTE — Progress Notes (Signed)
Patient ID: Brianna Fuller, female   DOB: 03/08/1951, 63 y.o.   MRN: [id number]    Subjective: Pt's scan still shows leak. Patient's pain is controlled  Objective: Vital signs in last 24 hours: Temp:  [97.8 F (36.6 C)-98.1 F (36.7 C)] 98.1 F (36.7 C) (09/02 0654) Pulse Rate:  [77-86] 86 (09/02 0654) Resp:  [18-20] 20 (09/02 0654) BP: (100-110)/(51-73) 110/66 mmHg (09/02 0654) SpO2:  [96 %-100 %] 97 % (09/02 0654) Weight:  [132 lb 0.9 oz (59.9 kg)] 132 lb 0.9 oz (59.9 kg) (09/02 0654) Last BM Date: 02/08/14  Intake/Output from previous day: 09/01 0701 - 09/02 0700 In: 1299.2 [I.V.:318.2; TPN:976] Out: 1110 [Urine:1100; Drains:10] Intake/Output this shift:    PE: Abd: soft, mildly tender in RUQ, few BS, ND Heart: regular  Lab Results:   Recent Labs  02/08/14 0400 02/10/14 0005  WBC 8.9 7.8  HGB 9.8* 9.7*  HCT 30.6* 29.4*  PLT 354 323   BMET  Recent Labs  02/08/14 0400 02/10/14 0005  NA 135* 135*  K 4.1 4.4  CL 99 98  CO2 27 27  GLUCOSE 124* 122*  BUN 16 16  CREATININE 0.50 0.52  CALCIUM 8.7 8.5   PT/INR No results found for this basename: LABPROT, INR,  in the last 72 hours CMP     Component Value Date/Time   NA 135* 02/10/2014 0005   K 4.4 02/10/2014 0005   CL 98 02/10/2014 0005   CO2 27 02/10/2014 0005   GLUCOSE 122* 02/10/2014 0005   BUN 16 02/10/2014 0005   CREATININE 0.52 02/10/2014 0005   CALCIUM 8.5 02/10/2014 0005   PROT 6.4 02/08/2014 0400   ALBUMIN 2.8* 02/08/2014 0400   AST 53* 02/08/2014 0400   ALT 69* 02/08/2014 0400   ALKPHOS 189* 02/08/2014 0400   BILITOT 0.3 02/08/2014 0400   GFRNONAA >90 02/10/2014 0005   GFRAA >90 02/10/2014 0005   Lipase     Component Value Date/Time   LIPASE 21 01/29/2014 2256       Studies/Results: Ir Sinus/fist Tube Chk-non Gi  02/10/2014   CLINICAL DATA:  63 year old with a contained duodenal perforation. Post percutaneous drain placement and scheduled for a drain injection.  EXAM: SINUS TRACT INJECTION/FISTULOGRAM   Physician: Stephan Minister. Anselm Pancoast, MD  FLUOROSCOPY TIME:  54 seconds  MEDICATIONS: None  ANESTHESIA/SEDATION: Moderate sedation time: None  CONTRAST:  15 mL Omnipaque 300  PROCEDURE: The patient was placed supine on the interventional table. Contrast was injected through the catheter under fluoroscopy. Catheter was flushed with normal saline at the end of the procedure.  FINDINGS: Scout image demonstrates contrast in the transverse colon. Pigtail catheter in the right abdomen. Contrast injection demonstrates opacification of theduodenum. Contrast identified in third and fourth portions of the duodenum. Findings consistent with a duodenum perforation likely in the region of D2. Small irregular collection around the catheter.  COMPLICATIONS: None  IMPRESSION: Drain injection demonstrates opacification of the duodenum. Findings consistent with a duodenum perforation. Small irregular collection around the drainage catheter.   Electronically Signed   By: Markus Daft M.D.   On: 02/10/2014 09:03    Anti-infectives: Anti-infectives   Start     Dose/Rate Route Frequency Ordered Stop   01/30/14 0800  imipenem-cilastatin (PRIMAXIN) 250 mg in sodium chloride 0.9 % 100 mL IVPB     250 mg 200 mL/hr over 30 Minutes Intravenous Every 6 hours 01/30/14 0740         Assessment/Plan  Retroperitoneal duodenal perforation, probably  D2.  -Status post successful percutaneous drainage of right retroperitoneal abscess -02/05/2014  -Drain study per IR still shows persistent leak of duodenum -Continue nonoperative management approach, but will d/w Dr. Brantley Stage to determine if and when this may change -Primaxin for Klebsiella, sensitive to Cipro, will d/w if this can be changed -TPN  -on lovenox for VTE prophylaxix  -protonix BID     LOS: 12 days    Hudsyn Barich E 02/10/2014, 11:20 AM Pager: [phone]

## 2014-02-10 NOTE — Progress Notes (Signed)
Can continue non operative management for up to 4  6 weeks on TNA to see if it will heal.  This could be transitioned to home with PICC.  Would keep NPO.  Would be nice to get EGD at some point to get a better idea of anatomy.

## 2014-02-11 LAB — COMPREHENSIVE METABOLIC PANEL
ALBUMIN: 2.9 g/dL — AB (ref 3.5–5.2)
ALT: 60 U/L — AB (ref 0–35)
AST: 40 U/L — ABNORMAL HIGH (ref 0–37)
Alkaline Phosphatase: 226 U/L — ABNORMAL HIGH (ref 39–117)
Anion gap: 11 (ref 5–15)
BUN: 17 mg/dL (ref 6–23)
CALCIUM: 8.8 mg/dL (ref 8.4–10.5)
CO2: 28 mEq/L (ref 19–32)
CREATININE: 0.54 mg/dL (ref 0.50–1.10)
Chloride: 99 mEq/L (ref 96–112)
GFR calc Af Amer: >90 mL/min (ref >90)
GFR calc non Af Amer: >90 mL/min (ref >90)
Glucose, Bld: 111 mg/dL — ABNORMAL HIGH (ref 70–99)
Potassium: 4.5 mEq/L (ref 3.7–5.3)
SODIUM: 138 meq/L (ref 137–147)
TOTAL PROTEIN: 6.7 g/dL (ref 6.0–8.3)
Total Bilirubin: 0.5 mg/dL (ref 0.3–1.2)

## 2014-02-11 LAB — PHOSPHORUS: PHOSPHORUS: 4.1 mg/dL (ref 2.3–4.6)

## 2014-02-11 LAB — MAGNESIUM: MAGNESIUM: 2.3 mg/dL (ref 1.5–2.5)

## 2014-02-11 MED ORDER — TRACE MINERALS CR-CU-F-FE-I-MN-MO-SE-ZN IV SOLN
INTRAVENOUS | Status: AC
  Administered 2014-02-11: 18:00:00 via INTRAVENOUS
  Filled 2014-02-11: qty 2000

## 2014-02-11 MED ORDER — FAT EMULSION 20 % IV EMUL
250.0000 mL | INTRAVENOUS | Status: AC
  Administered 2014-02-11: 250 mL via INTRAVENOUS
  Filled 2014-02-11: qty 250

## 2014-02-11 MED ORDER — FAT EMULSION 20 % IV EMUL: 250.0000 mL | INTRAVENOUS | Status: AC

## 2014-02-11 MED ORDER — TRACE MINERALS CR-CU-F-FE-I-MN-MO-SE-ZN IV SOLN: 65.0000 mL/h | INTRAVENOUS | Status: AC

## 2014-02-11 MED ORDER — CIPROFLOXACIN IN D5W 400 MG/200ML IV SOLN
400.0000 mg | Freq: Two times a day (BID) | INTRAVENOUS | Status: AC
  Administered 2014-02-11 – 2014-02-12 (×4): 400 mg via INTRAVENOUS
  Filled 2014-02-11 (×4): qty 200

## 2014-02-11 NOTE — Progress Notes (Signed)
Subjective: Patient denies any RLQ pain. She denies any fever or chills. She denies any N/V.  Objective: Physical Exam: BP 98/57  Pulse 70  Temp(Src) 98.7 F (37.1 C) (Oral)  Resp 15  Ht 5\' 4"  (1.626 m)  Wt 132 lb 0.9 oz (59.9 kg)  BMI 22.66 kg/m2  SpO2 97%  LMP 06/12/2007  General: A&Ox3, NAD  Abd: Soft, ND, RLQ tenderness, RLQ perc drain intact with tan purulent drainage in bag, 24 hr output 35 cc  Labs: CBC  Recent Labs  02/10/14 0005  WBC 7.8  HGB 9.7*  HCT 29.4*  PLT 323   BMET  Recent Labs  02/10/14 0005 02/11/14 0510  NA 135* 138  K 4.4 4.5  CL 98 99  CO2 27 28  GLUCOSE 122* 111*  BUN 16 17  CREATININE 0.52 0.54  CALCIUM 8.5 8.8   LFT  Recent Labs  02/11/14 0510  PROT 6.7  ALBUMIN 2.9*  AST 40*  ALT 60*  ALKPHOS 226*  BILITOT 0.5   PT/INR No results found for this basename: LABPROT, INR,  in the last 72 hours   Studies/Results: Ir Sinus/fist Tube Chk-non Gi  02/10/2014   CLINICAL DATA:  63 year old with a contained duodenal perforation. Post percutaneous drain placement and scheduled for a drain injection.  EXAM: SINUS TRACT INJECTION/FISTULOGRAM  Physician: Stephan Minister. Anselm Pancoast, MD  FLUOROSCOPY TIME:  54 seconds  MEDICATIONS: None  ANESTHESIA/SEDATION: Moderate sedation time: None  CONTRAST:  15 mL Omnipaque 300  PROCEDURE: The patient was placed supine on the interventional table. Contrast was injected through the catheter under fluoroscopy. Catheter was flushed with normal saline at the end of the procedure.  FINDINGS: Scout image demonstrates contrast in the transverse colon. Pigtail catheter in the right abdomen. Contrast injection demonstrates opacification of theduodenum. Contrast identified in third and fourth portions of the duodenum. Findings consistent with a duodenum perforation likely in the region of D2. Small irregular collection around the catheter.  COMPLICATIONS: None  IMPRESSION: Drain injection demonstrates opacification of the  duodenum. Findings consistent with a duodenum perforation. Small irregular collection around the drainage catheter.   Electronically Signed   By: Markus Daft M.D.   On: 02/10/2014 09:03    Assessment/Plan: Retroperitoneal duodenal perforation, on TPN and Cirpo, per CCS continue non operative management for up to 4-6 weeks on TNA to see if it will heal. RP abscess s/p percutaneous drain placed 8/28 with drain injection 02/10/14 with persistent duodenal leak, tan purulent output, Cx Klebsiella Pneumoniae, wbc wnl, afebrile.  Continue to monitor daily output and flush drain TID, repeat drain injection next Friday. Plans per [facility]CS    LOS: 13 days    Hedy Jacob PA-C 02/11/2014 11:18 AM

## 2014-02-11 NOTE — Progress Notes (Signed)
Patient ID: Brianna Fuller, female   DOB: 1951-06-10, 63 y.o.   MRN: [id number]    Subjective: Pt without any new c/o today.  Objective: Vital signs in last 24 hours: Temp:  [97.7 F (36.5 C)-98.7 F (37.1 C)] 98.7 F (37.1 C) (09/03 0500) Pulse Rate:  [70-113] 70 (09/03 0500) Resp:  [15-17] 15 (09/03 0500) BP: (98-120)/(57-87) 98/57 mmHg (09/03 0500) SpO2:  [97 %-98 %] 97 % (09/03 0500) Last BM Date: 02/08/14  Intake/Output from previous day: 09/02 0701 - 09/03 0700 In: 1123 [I.V.:227; TPN:896] Out: 785 [Urine:750; Drains:35] Intake/Output this shift:    PE: Abd: soft, appropriately tender around drain, but minimal, +BS, drain with tan output  Lab Results:   Recent Labs  02/10/14 0005  WBC 7.8  HGB 9.7*  HCT 29.4*  PLT 323   BMET  Recent Labs  02/10/14 0005 02/11/14 0510  NA 135* 138  K 4.4 4.5  CL 98 99  CO2 27 28  GLUCOSE 122* 111*  BUN 16 17  CREATININE 0.52 0.54  CALCIUM 8.5 8.8   PT/INR No results found for this basename: LABPROT, INR,  in the last 72 hours CMP     Component Value Date/Time   NA 138 02/11/2014 0510   K 4.5 02/11/2014 0510   CL 99 02/11/2014 0510   CO2 28 02/11/2014 0510   GLUCOSE 111* 02/11/2014 0510   BUN 17 02/11/2014 0510   CREATININE 0.54 02/11/2014 0510   CALCIUM 8.8 02/11/2014 0510   PROT 6.7 02/11/2014 0510   ALBUMIN 2.9* 02/11/2014 0510   AST 40* 02/11/2014 0510   ALT 60* 02/11/2014 0510   ALKPHOS 226* 02/11/2014 0510   BILITOT 0.5 02/11/2014 0510   GFRNONAA >90 02/11/2014 0510   GFRAA >90 02/11/2014 0510   Lipase     Component Value Date/Time   LIPASE 21 01/29/2014 2256       Studies/Results: Ir Sinus/fist Tube Chk-non Gi  02/10/2014   CLINICAL DATA:  63 year old with a contained duodenal perforation. Post percutaneous drain placement and scheduled for a drain injection.  EXAM: SINUS TRACT INJECTION/FISTULOGRAM  Physician: Stephan Minister. Anselm Pancoast, MD  FLUOROSCOPY TIME:  54 seconds  MEDICATIONS: None  ANESTHESIA/SEDATION: Moderate sedation time:  None  CONTRAST:  15 mL Omnipaque 300  PROCEDURE: The patient was placed supine on the interventional table. Contrast was injected through the catheter under fluoroscopy. Catheter was flushed with normal saline at the end of the procedure.  FINDINGS: Scout image demonstrates contrast in the transverse colon. Pigtail catheter in the right abdomen. Contrast injection demonstrates opacification of theduodenum. Contrast identified in third and fourth portions of the duodenum. Findings consistent with a duodenum perforation likely in the region of D2. Small irregular collection around the catheter.  COMPLICATIONS: None  IMPRESSION: Drain injection demonstrates opacification of the duodenum. Findings consistent with a duodenum perforation. Small irregular collection around the drainage catheter.   Electronically Signed   By: Markus Daft M.D.   On: 02/10/2014 09:03    Anti-infectives: Anti-infectives   Start     Dose/Rate Route Frequency Ordered Stop   02/11/14 0845  ciprofloxacin (CIPRO) IVPB 400 mg     400 mg 200 mL/hr over 60 Minutes Intravenous Every 12 hours 02/11/14 0838     01/30/14 0800  imipenem-cilastatin (PRIMAXIN) 250 mg in sodium chloride 0.9 % 100 mL IVPB  Status:  Discontinued     250 mg 200 mL/hr over 30 Minutes Intravenous Every 6 hours 01/30/14 0740 02/11/14 5643  Assessment/Plan  Retroperitoneal duodenal perforation, probably D2.  -Status post successful percutaneous drainage of right retroperitoneal abscess -02/05/2014  -Drain study per IR still shows persistent leak of duodenum  -Continue nonoperative management approach for 4-6 weeks prior to committing to any type of surgical intervention -Cipro for Klebsiella  -TPN, will start to cycle  -on lovenox for VTE prophylaxix  -protonix BID  - would like begin looking over the next week if we can get the patient home with [facility], but she doesn't have anyone at home who can help her.  LOS: 13 days    Jazae Gandolfi E 02/11/2014, 8:38  AM Pager: [phone]

## 2014-02-11 NOTE — Progress Notes (Signed)
IV team called and aware of new TPN orders. Brianna Fuller

## 2014-02-11 NOTE — Progress Notes (Signed)
PARENTERAL NUTRITION CONSULT NOTE - FOLLOW UP  Pharmacy Consult:  TPN Indication: Duodenal perforation  Allergies  Allergen Reactions  . Penicillins     Some forms of penicillin  . Amoxicillin Rash  . Other Rash    Patient cannot remember name of medication but knows it was an antibiotic used to treat UTI    Patient Measurements: Height: 5\' 4"  (162.6 cm) Weight: 132 lb 0.9 oz (59.9 kg) IBW/kg (Calculated) : 54.7 Adjusted Body Weight: 56.6kg Usual Weight: 58 kg  Vital Signs: Temp: 98.7 F (37.1 C) (09/03 0500) Temp src: Oral (09/03 0500) BP: 98/57 mmHg (09/03 0500) Pulse Rate: 70 (09/03 0500) Intake/Output from previous day: 09/02 0701 - 09/03 0700 In: 1123 [I.V.:227; TPN:896] Out: 785 [Urine:750; Drains:35]  Labs:  Recent Labs  02/10/14 0005  WBC 7.8  HGB 9.7*  HCT 29.4*  PLT 323     Recent Labs  02/10/14 0005 02/11/14 0510  NA 135* 138  K 4.4 4.5  CL 98 99  CO2 27 28  GLUCOSE 122* 111*  BUN 16 17  CREATININE 0.52 0.54  CALCIUM 8.5 8.8  MG  --  2.3  PHOS  --  4.1  PROT  --  6.7  ALBUMIN  --  2.9*  AST  --  40*  ALT  --  60*  ALKPHOS  --  226*  BILITOT  --  0.5   Estimated Creatinine Clearance: 63 ml/min (by C-G formula based on Cr of 0.54).   No results found for this basename: GLUCAP,  in the last 72 hours    Insulin Requirements in the past 24 hours:  None - SS/CBGs d/c'ed  Assessment: Admitted with diffuse abdominal pain and found to have perforated duodenum. No significant PMH.  S/p RP drain placement on 02/05/14 secondary to abscess.  Patient is tolerating TPN.   GI: bowel rest/TPN for retroperitoneal duodenal perforation, s/p RP drain to abscess >> O/P minimal so may pull drain.  Plan to get f/u CT in next 48-72 hrs to evaluate for leak/assess adequacy of drainage.  9/2 imaging consistent with duodenum perforation.  Prealbumin 5.5 >> 14.4 Endo: no hx DM - SSI/CBGs d/c'ed as minimal insulin requirements Lytes: stable Renal: SCr  stable - D5NS with 40 KCL at 61ml/hr, acceptable UOP 0.5 ml/hr/hr Cards: BP soft, HR ok Hepatobil: LFTs mildly elevated, tbili WNL.  TG WNL. ID: Primaxin D#10 changed to cipro for perforated duodenum - afebrile, WBC WNL.  8/28 abscess cx: Klebsiella pneumoniae pan sensitive >> ID PharmD left recommendation for de-escalation TPN Access: PICC 02/01/14 TPN day#: 10  Current Nutrition:  TPN  Nutritional Goals:  1500-1700 kCal, 70-80 grams of protein per day    Plan:  - Cycle Clinimix E 5/15  and IVFE over 18 hours - Daily multivitamin and trace elements - F/U tolerability cyclic TPN.  Excell Seltzer, PharmD Pager:  [phone] 02/11/2014, 10:24 AM

## 2014-02-11 NOTE — Progress Notes (Signed)
Stable.  Maybe home next week.  Finish ABX.  May need home TNA.

## 2014-02-12 MED ORDER — TRACE MINERALS CR-CU-F-FE-I-MN-MO-SE-ZN IV SOLN
INTRAVENOUS | Status: AC
  Administered 2014-02-12: 18:00:00 via INTRAVENOUS
  Filled 2014-02-12: qty 2000

## 2014-02-12 MED ORDER — FAT EMULSION 20 % IV EMUL
250.0000 mL | INTRAVENOUS | Status: AC
  Administered 2014-02-12: 250 mL via INTRAVENOUS
  Filled 2014-02-12: qty 250

## 2014-02-12 MED ORDER — PROMETHAZINE HCL 25 MG/ML IJ SOLN: 12.5000 mg | Freq: Four times a day (QID) | INTRAMUSCULAR | Status: DC | PRN

## 2014-02-12 MED ORDER — LORAZEPAM 2 MG/ML IJ SOLN
0.5000 mg | Freq: Four times a day (QID) | INTRAMUSCULAR | Status: DC | PRN
Start: 2014-02-12 — End: 2014-02-13
  Administered 2014-02-12: 0.5 mg via INTRAVENOUS
  Filled 2014-02-12: qty 1

## 2014-02-12 NOTE — Progress Notes (Signed)
NUTRITION FOLLOW UP  Intervention:   Cyclic TPN planned for 4-6 weeks  Nutrition Dx:   Inadequate oral intake related to altered GI function as evidenced by NPO status; ongoing  Goal:   Pt to meet >/= 90% of their estimated nutrition needs; met w/TPN  Monitor:   GI profile, labs, weights, TPN tolerance/perscriptions, I/Os  Assessment:   Retroperitoneal duodenal perforation, probably D2.  -Status post successful percutaneous drainage of right retroperitoneal abscess -02/05/2014  -Drain study per IR still shows persistent leak of duodenum  -Continue nonoperative management approach for 4-6 weeks prior to committing to any type of surgical intervention   -TPN converted to cyclic. 1556 ml of Clinimix E 5/15 and 239 ml of 20% lipid provides 1582 kcal (100% est kcal needs), and 78 gram protein (100% est protein) -Glucose < 150 mg/dl -Phos/Mg/K WNL    Height: Ht Readings from Last 1 Encounters:  02/01/14 _0  (1.626 m)    Weight Status:   Wt Readings from Last 1 Encounters:  02/10/14 132 lb 0.9 oz (59.9 kg)  01/30/14 134 lbs  Re-estimated needs:  Kcal: 1500-1700  Protein: 70-80 gm  Fluid: 1.5-1.7 L   Skin: incision  Diet Order: NPO   Intake/Output Summary (Last 24 hours) at 02/12/14 0913 Last data filed at 02/12/14 0905  Gross per 24 hour  Intake 1702.87 ml  Output    610 ml  Net 1092.87 ml    Last BM: 9/3   Labs:   Recent Labs Lab 02/08/14 0400 02/10/14 0005 02/11/14 0510  NA 135* 135* 138  K 4.1 4.4 4.5  CL 99 98 99  CO2 _1 BUN _2 CREATININE 0.50 0.52 0.54  CALCIUM 8.7 8.5 8.8  MG 2.4  --  2.3  PHOS 3.7  --  4.1  GLUCOSE 124* 122* 111*    CBG (last 3)  No results found for this basename: GLUCAP,  in the last 72 hours  Scheduled Meds: . antiseptic oral rinse  7 mL Mouth Rinse q12n4p  . chlorhexidine  15 mL Mouth Rinse BID  . ciprofloxacin  400 mg Intravenous Q12H  . enoxaparin (LOVENOX) injection  40 mg Subcutaneous Q24H  .  pantoprazole (PROTONIX) IV  40 mg Intravenous Q12H  . sodium chloride  10-40 mL Intracatheter Q12H    Continuous Infusions: . dextrose 5 % and 0.9 % NaCl with KCl 40 mEq/L 10 mL/hr at 02/08/14 1206  . Marland KitchenTPN (CLINIMIX-E) Adult 91 mL/hr at 02/11/14 2220   And  . fat emulsion 250 mL (02/11/14 1804)    [place], [place], [place] Pager [phone] After Hours Pager

## 2014-02-12 NOTE — Progress Notes (Signed)
Pt stable.  Desires non operative treatment. Will need home health and drainage for the next couple of weeks. Care has been extensively discussed with the patient.  Operative interventions discussed as well.

## 2014-02-12 NOTE — Progress Notes (Signed)
PARENTERAL NUTRITION CONSULT NOTE - FOLLOW UP  Pharmacy Consult:  TPN Indication: Duodenal perforation  Allergies  Allergen Reactions  . Penicillins     Some forms of penicillin  . Amoxicillin Rash  . Other Rash    Patient cannot remember name of medication but knows it was an antibiotic used to treat UTI    Patient Measurements: Height: 5\' 4"  (162.6 cm) Weight: 131 lb (59.421 kg) IBW/kg (Calculated) : 54.7 Adjusted Body Weight: 56.6kg Usual Weight: 58 kg  Vital Signs: Temp: 97.7 F (36.5 C) (09/04 0605) Temp src: Oral (09/04 0605) BP: 110/57 mmHg (09/04 0605) Pulse Rate: 84 (09/04 0605) Intake/Output from previous day: 09/03 0701 - 09/04 0700 In: 1702.9 [I.V.:451; TPN:1251.9] Out: 610 [Urine:600; Drains:10]  Labs:  Recent Labs  02/10/14 0005  WBC 7.8  HGB 9.7*  HCT 29.4*  PLT 323     Recent Labs  02/10/14 0005 02/11/14 0510  NA 135* 138  K 4.4 4.5  CL 98 99  CO2 27 28  GLUCOSE 122* 111*  BUN 16 17  CREATININE 0.52 0.54  CALCIUM 8.5 8.8  MG  --  2.3  PHOS  --  4.1  PROT  --  6.7  ALBUMIN  --  2.9*  AST  --  40*  ALT  --  60*  ALKPHOS  --  226*  BILITOT  --  0.5   Estimated Creatinine Clearance: 63 ml/min (by C-G formula based on Cr of 0.54).   No results found for this basename: GLUCAP,  in the last 72 hours    Insulin Requirements in the past 24 hours:  None - SS/CBGs d/c'ed  Assessment: Admitted with diffuse abdominal pain and found to have perforated duodenum. No significant PMH.  S/p RP drain placement on 02/05/14 secondary to abscess.  Patient is tolerating TPN.   GI: bowel rest/TPN for retroperitoneal duodenal perforation, s/p RP drain to abscess >> O/P minimal.   9/2 imaging consistent with duodenum perforation, persistent duodenal leak  Prealbumin 5.5 >> 14.4 Endo: no hx DM - SSI/CBGs d/c'ed as minimal insulin requirements Lytes: stable Renal: SCr stable - D5NS with 40 KCL at 32ml/hr, acceptable UOP 0.4 ml/hr/hr Cards: BP soft,  HR ok Hepatobil: LFTs mildly elevated, tbili WNL.  TG WNL. ID: Primaxin D#10 changed to cipro for perforated duodenum-  Abx completed today- afebrile, WBC WNL.   TPN Access: PICC 02/01/14 TPN day#: 11  Current Nutrition:  TPN  Nutritional Goals:  1500-1700 kCal, 70-80 grams of protein per day    Plan:  - Cycle Clinimix E 5/15  and IVFE over 12 hours - Daily multivitamin and trace elements - F/U tolerability cyclic TPN.  Excell Seltzer, PharmD Pager:  [phone] 02/12/2014, 10:27 AM

## 2014-02-12 NOTE — Progress Notes (Signed)
Patient ID: Brianna Fuller, female   DOB: 02/17/1951, 63 y.o.   MRN: [id number]    Subjective: Pt feels well.  No new complaints except some intermittent anxiety  Objective: Vital signs in last 24 hours: Temp:  [97.7 F (36.5 C)-97.9 F (36.6 C)] 97.7 F (36.5 C) (09/04 0605) Pulse Rate:  [70-84] 84 (09/04 0605) Resp:  [16] 16 (09/04 0605) BP: (110-112)/(57-68) 110/57 mmHg (09/04 0605) SpO2:  [97 %-99 %] 99 % (09/04 0605) Last BM Date: 02/11/14  Intake/Output from previous day: 09/03 0701 - 09/04 0700 In: 1702.9 [I.V.:451; TPN:1251.9] Out: 610 [Urine:600; Drains:10] Intake/Output this shift:    PE: Abd: soft, drain still with minimal tan output, few BS, minimally tender Heart: regular Lungs: CTAB  Lab Results:   Recent Labs  02/10/14 0005  WBC 7.8  HGB 9.7*  HCT 29.4*  PLT 323   BMET  Recent Labs  02/10/14 0005 02/11/14 0510  NA 135* 138  K 4.4 4.5  CL 98 99  CO2 27 28  GLUCOSE 122* 111*  BUN 16 17  CREATININE 0.52 0.54  CALCIUM 8.5 8.8   PT/INR No results found for this basename: LABPROT, INR,  in the last 72 hours CMP     Component Value Date/Time   NA 138 02/11/2014 0510   K 4.5 02/11/2014 0510   CL 99 02/11/2014 0510   CO2 28 02/11/2014 0510   GLUCOSE 111* 02/11/2014 0510   BUN 17 02/11/2014 0510   CREATININE 0.54 02/11/2014 0510   CALCIUM 8.8 02/11/2014 0510   PROT 6.7 02/11/2014 0510   ALBUMIN 2.9* 02/11/2014 0510   AST 40* 02/11/2014 0510   ALT 60* 02/11/2014 0510   ALKPHOS 226* 02/11/2014 0510   BILITOT 0.5 02/11/2014 0510   GFRNONAA >90 02/11/2014 0510   GFRAA >90 02/11/2014 0510   Lipase     Component Value Date/Time   LIPASE 21 01/29/2014 2256       Studies/Results: No results found.  Anti-infectives: Anti-infectives   Start     Dose/Rate Route Frequency Ordered Stop   02/11/14 1000  ciprofloxacin (CIPRO) IVPB 400 mg     400 mg 200 mL/hr over 60 Minutes Intravenous Every 12 hours 02/11/14 0838 02/12/14 2359   01/30/14 0800  imipenem-cilastatin  (PRIMAXIN) 250 mg in sodium chloride 0.9 % 100 mL IVPB  Status:  Discontinued     250 mg 200 mL/hr over 30 Minutes Intravenous Every 6 hours 01/30/14 0740 02/11/14 4287       Assessment/Plan   Retroperitoneal duodenal perforation, probably D2.  -Status post successful percutaneous drainage of right retroperitoneal abscess -02/05/2014  -Drain study per IR still shows persistent leak of duodenum  -Continue nonoperative management approach for 4-6 weeks prior to committing to any type of surgical intervention  -Cipro for Klebsiella, today will complete D14/14.  Stop date has been added for today -TPN, will start to cycle  -on lovenox for VTE prophylaxix  -protonix BID  -low dose ativan for anxiety - would like begin looking over the next week if we can get the patient home with [facility]   LOS: 14 days    Francyne Arreaga E 02/12/2014, 8:56 AM Pager: [phone]

## 2014-02-12 NOTE — Care Management Note (Signed)
    Page 1 of 2   02/16/2014     10:00:14 AM CARE MANAGEMENT NOTE 02/16/2014  Patient:  Brianna Fuller, Brianna Fuller   Account Number:  [id number]  Date Initiated:  02/03/2014  Documentation initiated by:  Magdalen Spatz  Subjective/Objective Assessment:     Action/Plan:   02/15/14 Notified of plan to d/c pt to home tomorrow 02/16/2014 with IV TPN, IV Protonix and Lovenox.   Anticipated DC Date:  02/16/2014   Anticipated DC Plan:  [place]         Choice offered to / List presented to:  C-1 Patient   DME arranged  Orlin Hilding      DME agency  [place] arranged  HH-1 RN      [place].   Status of service:  Completed, signed off Medicare Important Message given?   (If response is "NO", the following Medicare IM given date fields will be blank) Date Medicare IM given:   Medicare IM given by:   Date Additional Medicare IM given:   Additional Medicare IM given by:    Discharge Disposition:    Per UR Regulation:    If discussed at Long Length of Stay Meetings, dates discussed:   02/03/2014  02/09/2014  02/11/2014  02/16/2014    Comments:  02-16-14 Patient planning on discharging to her parents home :   [address] , [place] ,  [address] , Father's cell phone [phone] , home [phone] . Called Fuller for shower chair . Brianna Fuller also coming to do more teaching before discharge. Magdalen Spatz RN BSN [phone]      02/15/2014 Notifed by surgical PA that pt will d/c to home tomorrow 02/16/14 with TPN, IV protonix and Lovenox. [place] notified of plans.   CRoyal RN MPH, case manager [phone]  02-12-14 Spoke with patient and her parents at bedside , they would like [facility] .  Referral given to [place] RN BSN [phone]     02-11-14 Anticipated DC Plan home with TNA . Discussed same with patient and her parents . Explained extension can be put on PICC line to make it easier to manage . Confirmed  face sheet information with patient . Provided list of home health agencies to patient .  Patient stated she needed some time to decide on which home health agency she wants . Patient has my direct work cell phone number .   Will follow up with patient in am. Magdalen Spatz RN BSN [phone]

## 2014-02-13 MED ORDER — FAT EMULSION 20 % IV EMUL
250.0000 mL | INTRAVENOUS | Status: AC
  Administered 2014-02-13: 250 mL via INTRAVENOUS
  Filled 2014-02-13: qty 250

## 2014-02-13 MED ORDER — HYDROMORPHONE HCL PF 1 MG/ML IJ SOLN
0.5000 mg | INTRAMUSCULAR | Status: DC | PRN
  Administered 2014-02-13: 0.5 mg via INTRAVENOUS
  Filled 2014-02-13: qty 1

## 2014-02-13 MED ORDER — FENTANYL 25 MCG/HR TD PT72
25.0000 ug | MEDICATED_PATCH | TRANSDERMAL | Status: DC
  Administered 2014-02-13 – 2014-02-16 (×2): 25 ug via TRANSDERMAL
  Filled 2014-02-13 (×2): qty 1

## 2014-02-13 MED ORDER — TRACE MINERALS CR-CU-F-FE-I-MN-MO-SE-ZN IV SOLN
INTRAVENOUS | Status: AC
  Administered 2014-02-13: 18:00:00 via INTRAVENOUS
  Filled 2014-02-13: qty 2000

## 2014-02-13 NOTE — Progress Notes (Signed)
PARENTERAL NUTRITION CONSULT NOTE - FOLLOW UP  Pharmacy Consult:  TPN Indication: Duodenal perforation  Allergies  Allergen Reactions  . Penicillins     Some forms of penicillin  . Amoxicillin Rash  . Other Rash    Patient cannot remember name of medication but knows it was an antibiotic used to treat UTI    Patient Measurements: Height: 5\' 4"  (162.6 cm) Weight: 131 lb (59.421 kg) IBW/kg (Calculated) : 54.7 Adjusted Body Weight: 56.6kg Usual Weight: 58 kg  Vital Signs: Temp: 97.9 F (36.6 C) (09/05 0605) Temp src: Oral (09/05 0605) BP: 114/61 mmHg (09/05 0605) Pulse Rate: 67 (09/05 0605) Intake/Output from previous day: 09/04 0701 - 09/05 0700 In: 1543.7 [I.V.:316.7; IV Piggyback:600; TPN:627] Out: 20 [Drains:20]  Labs: No results found for this basename: WBC, HGB, HCT, PLT, APTT, INR,  in the last 72 hours   Recent Labs  02/11/14 0510  NA 138  K 4.5  CL 99  CO2 28  GLUCOSE 111*  BUN 17  CREATININE 0.54  CALCIUM 8.8  MG 2.3  PHOS 4.1  PROT 6.7  ALBUMIN 2.9*  AST 40*  ALT 60*  ALKPHOS 226*  BILITOT 0.5   Estimated Creatinine Clearance: 63 ml/min (by C-G formula based on Cr of 0.54).   No results found for this basename: GLUCAP,  in the last 72 hours    Insulin Requirements in the past 24 hours:  None - SS/CBGs d/c'ed  Assessment: Admitted with diffuse abdominal pain and found to have perforated duodenum. No significant PMH.  S/p RP drain placement on 02/05/14 secondary to abscess.  Patient is tolerating TPN.   GI: bowel rest/TPN for retroperitoneal duodenal perforation, s/p RP drain to abscess >> O/P minimal.   9/2 imaging consistent with duodenum perforation, persistent duodenal leak  Prealbumin 5.5 >> 14.4 Endo: no hx DM - SSI/CBGs d/c'ed as minimal insulin requirements Lytes: stable Renal: SCr stable - D5NS with 40 KCL at 62ml/hr Cards: VSS Hepatobil: LFTs mildly elevated, tbili WNL.  TG WNL. ID: Primaxin D#[id number] changed to cipro for perforated  duodenum-  Abx completed today- afebrile, WBC WNL.   TPN Access: PICC 02/01/14 TPN day#: 12  Current Nutrition:  TPN  Nutritional Goals:  1500-1700 kCal, 70-80 grams of protein per day    Plan:  - Cycle Clinimix E 5/15  and IVFE over 12 hours - Daily multivitamin and trace elements - F/U tolerability cyclic TPN.  Excell Seltzer, PharmD Pager:  [phone] 02/13/2014, 11:30 AM

## 2014-02-13 NOTE — Progress Notes (Signed)
  Subjective: Pt without new c/o  Objective: Vital signs in last 24 hours: Temp:  [97.9 F (36.6 C)-98.2 F (36.8 C)] 97.9 F (36.6 C) (09/05 0605) Pulse Rate:  [67-71] 67 (09/05 0605) Resp:  [18-19] 18 (09/05 0605) BP: (114-119)/(61-67) 114/61 mmHg (09/05 0605) SpO2:  [98 %-99 %] 98 % (09/05 0605) Last BM Date: 02/13/14  Intake/Output from previous day: 09/04 0701 - 09/05 0700 In: 1543.7 [I.V.:316.7; IV Piggyback:600; TPN:627] Out: 20 [Drains:20] Intake/Output this shift:    Rt abd drain intact, insertion site ok, minimal tenderness, output 20 cc's beige colored fluid  Lab Results:  No results found for this basename: WBC, HGB, HCT, PLT,  in the last 72 hours BMET  Recent Labs  02/11/14 0510  NA 138  K 4.5  CL 99  CO2 28  GLUCOSE 111*  BUN 17  CREATININE 0.54  CALCIUM 8.8   PT/INR No results found for this basename: LABPROT, INR,  in the last 72 hours ABG No results found for this basename: PHART, PCO2, PO2, HCO3,  in the last 72 hours  Studies/Results: No results found.  Anti-infectives: Anti-infectives   Start     Dose/Rate Route Frequency Ordered Stop   02/11/14 1000  ciprofloxacin (CIPRO) IVPB 400 mg     400 mg 200 mL/hr over 60 Minutes Intravenous Every 12 hours 02/11/14 0838 02/12/14 2308   01/30/14 0800  imipenem-cilastatin (PRIMAXIN) 250 mg in sodium chloride 0.9 % 100 mL IVPB  Status:  Discontinued     250 mg 200 mL/hr over 30 Minutes Intravenous Every 6 hours 01/30/14 0740 02/11/14 0838      Assessment/Plan: s/p drainage of RP abscess 8/28 secondary to duodenal perf; persistent leak  noted on 02/10/14 drain inj; cont current tx as per CCS; will need repeat drain injection on 02/19/14.   LOS: 15 days    ALLRED,D [facility] 02/13/2014

## 2014-02-13 NOTE — Progress Notes (Signed)
  Subjective: Reaction to ativan overnight otherwise no change  Objective: Vital signs in last 24 hours: Temp:  [97.9 F (36.6 C)-98.3 F (36.8 C)] 97.9 F (36.6 C) (09/05 0605) Pulse Rate:  [67-76] 67 (09/05 0605) Resp:  [15-19] 18 (09/05 0605) BP: (97-119)/(55-67) 114/61 mmHg (09/05 0605) SpO2:  [98 %-99 %] 98 % (09/05 0605) Last BM Date: 02/13/14  Intake/Output from previous day: 09/04 0701 - 09/05 0700 In: 1543.7 [I.V.:316.7; IV Piggyback:600; TPN:627] Out: 20 [Drains:20] Intake/Output this shift:    General appearance: no distress Resp: clear to auscultation bilaterally Cardio: regular rate and rhythm GI: drain with some purulent fluid, soft nontender  Lab Results:  No results found for this basename: WBC, HGB, HCT, PLT,  in the last 72 hours BMET  Recent Labs  02/11/14 0510  NA 138  K 4.5  CL 99  CO2 28  GLUCOSE 111*  BUN 17  CREATININE 0.54  CALCIUM 8.8   Studies/Results: No results found.  Anti-infectives: Anti-infectives   Start     Dose/Rate Route Frequency Ordered Stop   02/11/14 1000  ciprofloxacin (CIPRO) IVPB 400 mg     400 mg 200 mL/hr over 60 Minutes Intravenous Every 12 hours 02/11/14 0838 02/12/14 2308   01/30/14 0800  imipenem-cilastatin (PRIMAXIN) 250 mg in sodium chloride 0.9 % 100 mL IVPB  Status:  Discontinued     250 mg 200 mL/hr over 30 Minutes Intravenous Every 6 hours 01/30/14 0740 02/11/14 4259      Assessment/Plan: Retroperitoneal duodenal perforation managed nonoperatively 1. Neuro- reaction to ativan overnight, she took this for sleep and anxiety. She doesn't really have pain but is using dilaudid for sleep and anxiety right now. Will cont dilaudid but if she is going to be discharged need plan for mgt 2. pulm toilet 3. Cycle tna, continue npo, continue protonix 4. cipro completed for abscess, will follow, check labs tomorrow including cbc 5. lovenox      Brianna Fuller 02/13/2014

## 2014-02-13 NOTE — Progress Notes (Signed)
Agree 

## 2014-02-14 LAB — CBC
HEMATOCRIT: 32 % — AB (ref 36.0–46.0)
HEMOGLOBIN: 10.2 g/dL — AB (ref 12.0–15.0)
MCH: 29.2 pg (ref 26.0–34.0)
MCHC: 31.9 g/dL (ref 30.0–36.0)
MCV: 91.7 fL (ref 78.0–100.0)
Platelets: 425 10*3/uL — ABNORMAL HIGH (ref 150–400)
RBC: 3.49 MIL/uL — ABNORMAL LOW (ref 3.87–5.11)
RDW: 13.3 % (ref 11.5–15.5)
WBC: 6.3 10*3/uL (ref 4.0–10.5)

## 2014-02-14 LAB — BASIC METABOLIC PANEL
Anion gap: 11 (ref 5–15)
BUN: 23 mg/dL (ref 6–23)
CHLORIDE: 102 meq/L (ref 96–112)
CO2: 25 meq/L (ref 19–32)
CREATININE: 0.48 mg/dL — AB (ref 0.50–1.10)
Calcium: 8.8 mg/dL (ref 8.4–10.5)
GFR calc Af Amer: >90 mL/min (ref >90)
GFR calc non Af Amer: >90 mL/min (ref >90)
Glucose, Bld: 97 mg/dL (ref 70–99)
Potassium: 4.3 mEq/L (ref 3.7–5.3)
Sodium: 138 mEq/L (ref 137–147)

## 2014-02-14 MED ORDER — TRACE MINERALS CR-CU-F-FE-I-MN-MO-SE-ZN IV SOLN
INTRAVENOUS | Status: AC
  Administered 2014-02-14: 18:00:00 via INTRAVENOUS
  Filled 2014-02-14: qty 2000

## 2014-02-14 MED ORDER — FAT EMULSION 20 % IV EMUL
250.0000 mL | INTRAVENOUS | Status: AC
  Administered 2014-02-14: 250 mL via INTRAVENOUS
  Filled 2014-02-14: qty 250

## 2014-02-14 NOTE — Progress Notes (Signed)
  Subjective: Pt doing well; no acute c/o  Objective: Vital signs in last 24 hours: Temp:  [98 F (36.7 C)-98.1 F (36.7 C)] 98.1 F (36.7 C) (09/06 0703) Pulse Rate:  [69-80] 80 (09/06 0703) Resp:  [17] 17 (09/06 0703) BP: (109-110)/(60-67) 110/60 mmHg (09/06 0703) SpO2:  [98 %-99 %] 99 % (09/06 0703) Weight:  [127 lb (57.607 kg)] 127 lb (57.607 kg) (09/06 0703) Last BM Date: 02/13/14  Intake/Output from previous day: 09/05 0701 - 09/06 0700 In: 255 [I.V.:250] Out: 15 [Drains:15] Intake/Output this shift:    Rt abd drain intact, insertion site ok, NT, output 15 cc's beige colored fluid  Lab Results:   Recent Labs  02/14/14 0500  WBC 6.3  HGB 10.2*  HCT 32.0*  PLT 425*   BMET  Recent Labs  02/14/14 0500  NA 138  K 4.3  CL 102  CO2 25  GLUCOSE 97  BUN 23  CREATININE 0.48*  CALCIUM 8.8   PT/INR No results found for this basename: LABPROT, INR,  in the last 72 hours ABG No results found for this basename: PHART, PCO2, PO2, HCO3,  in the last 72 hours  Studies/Results: No results found.  Anti-infectives: Anti-infectives   Start     Dose/Rate Route Frequency Ordered Stop   02/11/14 1000  ciprofloxacin (CIPRO) IVPB 400 mg     400 mg 200 mL/hr over 60 Minutes Intravenous Every 12 hours 02/11/14 0838 02/12/14 2308   01/30/14 0800  imipenem-cilastatin (PRIMAXIN) 250 mg in sodium chloride 0.9 % 100 mL IVPB  Status:  Discontinued     250 mg 200 mL/hr over 30 Minutes Intravenous Every 6 hours 01/30/14 0740 02/11/14 0838      Assessment/Plan: s/p drainage of RP abscess 8/28 secondary to duodenal perf; persistent leak noted on 02/10/14 drain inj; cont current tx as per CCS; will need repeat drain injection on 02/19/14.      LOS: 16 days    Lucianne Smestad,D [facility] 02/14/2014

## 2014-02-14 NOTE — Progress Notes (Signed)
PARENTERAL NUTRITION CONSULT NOTE - FOLLOW UP  Pharmacy Consult:  TPN Indication: Duodenal perforation  Allergies  Allergen Reactions  . Penicillins     Some forms of penicillin  . Amoxicillin Rash  . Other Rash    Patient cannot remember name of medication but knows it was an antibiotic used to treat UTI    Patient Measurements: Height: 5\' 4"  (162.6 cm) Weight: 127 lb (57.607 kg) IBW/kg (Calculated) : 54.7 Adjusted Body Weight: 56.6kg Usual Weight: 58 kg  Vital Signs: Temp: 98.1 F (36.7 C) (09/06 0703) Temp src: Oral (09/05 2125) BP: 110/60 mmHg (09/06 0703) Pulse Rate: 80 (09/06 0703) Intake/Output from previous day: 09/05 0701 - 09/06 0700 In: 255 [I.V.:250] Out: 15 [Drains:15]  Labs:  Recent Labs  02/14/14 0500  WBC 6.3  HGB 10.2*  HCT 32.0*  PLT 425*     Recent Labs  02/14/14 0500  NA 138  K 4.3  CL 102  CO2 25  GLUCOSE 97  BUN 23  CREATININE 0.48*  CALCIUM 8.8   Estimated Creatinine Clearance: 63 ml/min (by C-G formula based on Cr of 0.48).   No results found for this basename: GLUCAP,  in the last 72 hours    Insulin Requirements in the past 24 hours:  None - SS/CBGs d/c'ed  Assessment: Admitted with diffuse abdominal pain and found to have perforated duodenum. No significant PMH.  S/p RP drain placement on 02/05/14 secondary to abscess.  Patient is tolerating TPN.   GI: bowel rest/TPN for retroperitoneal duodenal perforation, s/p RP drain to abscess >> O/P minimal.   9/2 imaging consistent with duodenum perforation, persistent duodenal leak  Prealbumin 5.5 >> 14.4 Endo: no hx DM - SSI/CBGs d/c'ed as minimal insulin requirements Lytes: stable Renal: SCr stable - D5NS with 40 KCL at 101ml/hr Cards: VSS Hepatobil: LFTs mildly elevated, tbili WNL.  TG WNL. ID: Primaxin D#10 changed to cipro for perforated duodenum-  Abx completed 9/5- afebrile, WBC WNL.   TPN Access: PICC 02/01/14 TPN day#: 13  Current Nutrition:  TPN  Nutritional  Goals:  1500-1700 kCal, 70-80 grams of protein per day    Plan:  - Cycle Clinimix E 5/15  and IVFE over 12 hours - Daily multivitamin and trace elements - F/U am labs  Excell Seltzer, PharmD Pager:  [phone] 02/14/2014, 9:13 AM

## 2014-02-14 NOTE — Progress Notes (Signed)
  Subjective: No complaints, slept well, no pain, did well on patch  Objective: Vital signs in last 24 hours: Temp:  [98 F (36.7 C)-98.1 F (36.7 C)] 98.1 F (36.7 C) (09/06 0703) Pulse Rate:  [69-80] 80 (09/06 0703) Resp:  [17] 17 (09/06 0703) BP: (109-110)/(60-67) 110/60 mmHg (09/06 0703) SpO2:  [98 %-99 %] 99 % (09/06 0703) Weight:  [127 lb (57.607 kg)] 127 lb (57.607 kg) (09/06 0703) Last BM Date: 02/13/14  Intake/Output from previous day: 09/05 0701 - 09/06 0700 In: 255 [I.V.:250] Out: 15 [Drains:15] Intake/Output this shift:    General appearance: no distress Resp: clear to auscultation bilaterally Cardio: regular rate and rhythm GI: nontender, bs present, drain functional  Lab Results:   Recent Labs  02/14/14 0500  WBC 6.3  HGB 10.2*  HCT 32.0*  PLT 425*   BMET  Recent Labs  02/14/14 0500  NA 138  K 4.3  CL 102  CO2 25  GLUCOSE 97  BUN 23  CREATININE 0.48*  CALCIUM 8.8   PT/INR No results found for this basename: LABPROT, INR,  in the last 72 hours ABG No results found for this basename: PHART, PCO2, PO2, HCO3,  in the last 72 hours  Studies/Results: No results found.  Anti-infectives: Anti-infectives   Start     Dose/Rate Route Frequency Ordered Stop   02/11/14 1000  ciprofloxacin (CIPRO) IVPB 400 mg     400 mg 200 mL/hr over 60 Minutes Intravenous Every 12 hours 02/11/14 0838 02/12/14 2308   01/30/14 0800  imipenem-cilastatin (PRIMAXIN) 250 mg in sodium chloride 0.9 % 100 mL IVPB  Status:  Discontinued     250 mg 200 mL/hr over 30 Minutes Intravenous Every 6 hours 01/30/14 0740 02/11/14 2707      Assessment/Plan: Retroperitoneal duodenal perforation managed nonoperatively  1. Neuro- did well on fentanyl patch, will plan dc possibly Tuesday if continues to do well with no iv needs 2. pulm toilet  3. Cycle tna, continue npo, continue protonix  4. Wbc normal, no more abx 5. lovenox   Ponce Skillman 02/14/2014

## 2014-02-15 LAB — COMPREHENSIVE METABOLIC PANEL
ALT: 90 U/L — ABNORMAL HIGH (ref 0–35)
AST: 61 U/L — ABNORMAL HIGH (ref 0–37)
Albumin: 3.1 g/dL — ABNORMAL LOW (ref 3.5–5.2)
Alkaline Phosphatase: 213 U/L — ABNORMAL HIGH (ref 39–117)
Anion gap: 10 (ref 5–15)
BILIRUBIN TOTAL: 0.4 mg/dL (ref 0.3–1.2)
BUN: 24 mg/dL — ABNORMAL HIGH (ref 6–23)
CHLORIDE: 103 meq/L (ref 96–112)
CO2: 25 meq/L (ref 19–32)
CREATININE: 0.51 mg/dL (ref 0.50–1.10)
Calcium: 8.7 mg/dL (ref 8.4–10.5)
GFR calc Af Amer: >90 mL/min (ref >90)
Glucose, Bld: 94 mg/dL (ref 70–99)
Potassium: 4.4 mEq/L (ref 3.7–5.3)
SODIUM: 138 meq/L (ref 137–147)
Total Protein: 6.6 g/dL (ref 6.0–8.3)

## 2014-02-15 LAB — TRIGLYCERIDES: Triglycerides: 64 mg/dL (ref <150)

## 2014-02-15 LAB — DIFFERENTIAL
BASOS PCT: 2 % — AB (ref 0–1)
Basophils Absolute: 0.1 10*3/uL (ref 0.0–0.1)
Eosinophils Absolute: 0.2 10*3/uL (ref 0.0–0.7)
Eosinophils Relative: 4 % (ref 0–5)
Lymphocytes Relative: 25 % (ref 12–46)
Lymphs Abs: 1.5 10*3/uL (ref 0.7–4.0)
Monocytes Absolute: 0.7 10*3/uL (ref 0.1–1.0)
Monocytes Relative: 11 % (ref 3–12)
Neutro Abs: 3.5 10*3/uL (ref 1.7–7.7)
Neutrophils Relative %: 58 % (ref 43–77)

## 2014-02-15 LAB — CBC
HEMATOCRIT: 31.3 % — AB (ref 36.0–46.0)
Hemoglobin: 10.1 g/dL — ABNORMAL LOW (ref 12.0–15.0)
MCH: 30.4 pg (ref 26.0–34.0)
MCHC: 32.3 g/dL (ref 30.0–36.0)
MCV: 94.3 fL (ref 78.0–100.0)
Platelets: 389 10*3/uL (ref 150–400)
RBC: 3.32 MIL/uL — ABNORMAL LOW (ref 3.87–5.11)
RDW: 13 % (ref 11.5–15.5)
WBC: 5.9 10*3/uL (ref 4.0–10.5)

## 2014-02-15 LAB — MAGNESIUM: Magnesium: 2.1 mg/dL (ref 1.5–2.5)

## 2014-02-15 LAB — PREALBUMIN: Prealbumin: 19.6 mg/dL (ref 17.0–34.0)

## 2014-02-15 LAB — PHOSPHORUS: Phosphorus: 3.9 mg/dL (ref 2.3–4.6)

## 2014-02-15 MED ORDER — TRACE MINERALS CR-CU-F-FE-I-MN-MO-SE-ZN IV SOLN
INTRAVENOUS | Status: AC
  Administered 2014-02-15: 18:00:00 via INTRAVENOUS
  Filled 2014-02-15: qty 2000

## 2014-02-15 MED ORDER — FAT EMULSION 20 % IV EMUL
250.0000 mL | INTRAVENOUS | Status: AC
  Administered 2014-02-15: 250 mL via INTRAVENOUS
  Filled 2014-02-15: qty 250

## 2014-02-15 NOTE — Progress Notes (Addendum)
  Subjective: Feels good, active day, some ruq and back pain with deep inspiration  Objective: Vital signs in last 24 hours: Temp:  [97.4 F (36.3 C)-98 F (36.7 C)] 97.4 F (36.3 C) (09/07 0500) Pulse Rate:  [74-80] 77 (09/07 0500) Resp:  [16-17] 17 (09/07 0500) BP: (100-106)/(57-62) 100/57 mmHg (09/07 0500) SpO2:  [99 %-100 %] 99 % (09/07 0500) Last BM Date: 02/13/14  Intake/Output from previous day: 09/06 0701 - 09/07 0700 In: -  Out: 410 [Urine:400; Drains:10] Intake/Output this shift:    General appearance: no distress Resp: clear to auscultation bilaterally Chest wall: no tenderness, right sided chest wall tenderness Cardio: regular rate and rhythm GI: drain with no real output, bs present nontender  Lab Results:   Recent Labs  02/14/14 0500 02/15/14 0500  WBC 6.3 5.9  HGB 10.2* 10.1*  HCT 32.0* 31.3*  PLT 425* 389   BMET  Recent Labs  02/14/14 0500 02/15/14 0500  NA 138 138  K 4.3 4.4  CL 102 103  CO2 25 25  GLUCOSE 97 94  BUN 23 24*  CREATININE 0.48* 0.51  CALCIUM 8.8 8.7    Assessment/Plan: Retroperitoneal duodenal perforation managed nonoperatively  1. Neuro- did well on fentanyl patch, will plan dc possibly tomorrow if continues to do well with no iv needs and if home health all set up, will try heating pad for ruq/back pain, I think this is musculoskeletal or related to drain (although not right at site).  If continues or worsens may need to repeat ct 2. pulm toilet  3. Cycle tna, continue npo, continue protonix. I would send her home on her protonix dose 4. Lovenox, I would also plan on sending her home on lovenox 5. Would hold on drain injection on 9/11, I don't think this area will have healed by then. If she goes home would plan on doing another one in the next couple weeks   [facility] 02/15/2014

## 2014-02-15 NOTE — Progress Notes (Signed)
[facility]  Patient Status: new pt this admission for [facility]  [facility] is providing the following services: HHRN and Home Infusion Pharmacy Team for home TNA. In hospital teaching regarding home TNA set up and administration provided for pt on 02/12/14.  Pt did very well and engaged with hands on teaching. [facility] hospital coordinator team will continue to work with pt and follow until DC to support transition home.   If patient discharges after hours, please call [phone].   Larry Sierras 02/15/2014, 11:10 AM

## 2014-02-15 NOTE — Progress Notes (Signed)
PARENTERAL NUTRITION CONSULT NOTE - FOLLOW UP  Pharmacy Consult:  TPN Indication: Duodenal perforation  Allergies  Allergen Reactions  . Penicillins     Some forms of penicillin  . Amoxicillin Rash  . Other Rash    Patient cannot remember name of medication but knows it was an antibiotic used to treat UTI    Patient Measurements: Height: 5\' 4"  (162.6 cm) Weight: 127 lb (57.607 kg) IBW/kg (Calculated) : 54.7 Adjusted Body Weight: 56.6kg Usual Weight: 58 kg  Vital Signs: Temp: 97.4 F (36.3 C) (09/07 0500) Temp src: Oral (09/06 2144) BP: 100/57 mmHg (09/07 0500) Pulse Rate: 77 (09/07 0500) Intake/Output from previous day: 09/06 0701 - 09/07 0700 In: -  Out: 410 [Urine:400; Drains:10]  Labs:  Recent Labs  02/14/14 0500 02/15/14 0500  WBC 6.3 5.9  HGB 10.2* 10.1*  HCT 32.0* 31.3*  PLT 425* 389     Recent Labs  02/14/14 0500 02/15/14 0500  NA 138 138  K 4.3 4.4  CL 102 103  CO2 25 25  GLUCOSE 97 94  BUN 23 24*  CREATININE 0.48* 0.51  CALCIUM 8.8 8.7  MG  --  2.1  PHOS  --  3.9  PROT  --  6.6  ALBUMIN  --  3.1*  AST  --  61*  ALT  --  90*  ALKPHOS  --  213*  BILITOT  --  0.4  TRIG  --  64   Estimated Creatinine Clearance: 63 ml/min (by C-G formula based on Cr of 0.51).   No results found for this basename: GLUCAP,  in the last 72 hours    Insulin Requirements in the past 24 hours:  None - SS/CBGs d/c'ed  Assessment: Admitted with diffuse abdominal pain and found to have perforated duodenum. No significant PMH.  S/p RP drain placement on 02/05/14 secondary to abscess.  Patient is tolerating TPN.   GI: bowel rest/TPN for retroperitoneal duodenal perforation, s/p RP drain to abscess >> O/P minimal.   9/2 imaging consistent with duodenum perforation, persistent duodenal leak  Prealbumin pending Endo: no hx DM - SSI/CBGs d/c'ed as minimal insulin requirements Lytes: Lytes normal Renal: SCr stable - D5NS with 40 KCL at 64ml/hr Cards:  VSS Hepatobil: LFTs mildly elevated, tbili WNL.  TG 64 ID: Primaxin D#10 changed to cipro for perforated duodenum-  Abx completed 9/5- afebrile, WBC WNL.   TPN Access: PICC 02/01/14 TPN day#: 14  Current Nutrition:  TPN  Nutritional Goals:  1500-1700 kCal, 70-80 grams of protein per day    Plan:  - Cycle Clinimix E 5/15  and IVFE over 12 hours - Daily multivitamin and trace elements  Excell Seltzer, PharmD Pager:  [phone] 02/15/2014, 9:29 AM

## 2014-02-15 NOTE — Care Management Note (Signed)
CARE MANAGEMENT NOTE 02/15/2014  Patient:  Brianna Fuller, Brianna Fuller   Account Number:  [id number]  Date Initiated:  02/03/2014  Documentation initiated by:  Brianna Fuller  Subjective/Objective Assessment:     Action/Plan:   02/15/14 Notified of plan to d/c pt to home tomorrow 02/16/2014 with IV TPN, IV Protonix and Lovenox.   Anticipated DC Date:     Anticipated DC Plan:  [place]         Choice offered to / List presented to:          [facility] arranged  HH-1 RN      [place].   Status of service:   Medicare Important Message given?   (If response is "NO", the following Medicare IM given date fields will be blank) Date Medicare IM given:   Medicare IM given by:   Date Additional Medicare IM given:   Additional Medicare IM given by:    Discharge Disposition:    Per UR Regulation:    If discussed at Long Length of Stay Meetings, dates discussed:   02/03/2014  02/09/2014  02/11/2014    Comments:  02/15/2014 Notifed by surgical PA that pt will d/c to home tomorrow 02/16/14 with TPN, IV protonix and Lovenox. [facility] notified of plans.   CRoyal RN MPH, case manager [phone]  02-12-14 Spoke with patient and her parents at bedside , they would like [facility] .  Referral given to [facility] RN BSN [phone]     02-11-14 Anticipated DC Plan home with TNA . Discussed same with patient and her parents . Explained extension can be put on PICC line to make it easier to manage . Confirmed face sheet information with patient . Provided list of home health agencies to patient .  Patient stated she needed some time to decide on which home health agency she wants . Patient has my direct work cell phone number .   Will follow up with patient in am. Brianna Spatz RN BSN [phone]

## 2014-02-16 ENCOUNTER — Telehealth (INDEPENDENT_AMBULATORY_CARE_PROVIDER_SITE_OTHER): Payer: Self-pay

## 2014-02-16 ENCOUNTER — Other Ambulatory Visit (INDEPENDENT_AMBULATORY_CARE_PROVIDER_SITE_OTHER): Payer: Self-pay

## 2014-02-16 DIAGNOSIS — K631 Perforation of intestine (nontraumatic): Secondary | ICD-10-CM

## 2014-02-16 MED ORDER — ENOXAPARIN SODIUM 40 MG/0.4ML ~~LOC~~ SOLN: 40.0000 mg | SUBCUTANEOUS | Status: DC

## 2014-02-16 MED ORDER — PANTOPRAZOLE SODIUM 40 MG IV SOLR: 40.0000 mg | Freq: Two times a day (BID) | INTRAVENOUS | Status: DC

## 2014-02-16 MED ORDER — HEPARIN SOD (PORK) LOCK FLUSH 100 UNIT/ML IV SOLN
250.0000 [IU] | INTRAVENOUS | Status: AC | PRN
  Administered 2014-02-16: 250 [IU]

## 2014-02-16 MED ORDER — FENTANYL 25 MCG/HR TD PT72: 25.0000 ug | MEDICATED_PATCH | TRANSDERMAL | Status: DC

## 2014-02-16 MED ORDER — ENOXAPARIN SODIUM 40 MG/0.4ML ~~LOC~~ SOLN
40.0000 mg | SUBCUTANEOUS | Status: DC
Start: 2014-02-16 — End: 2014-07-28

## 2014-02-16 NOTE — Progress Notes (Signed)
Discharge home. Home discharge instruction given. Patient able to do her own lovenox injection. Home health needs addressed. [facility] nurse in and talked with patient regarding TPN, protonix injections.

## 2014-02-16 NOTE — Telephone Encounter (Signed)
Message copied by Carlene Coria on Tue Feb 16, 2014  4:24 PM ------      Message from: Erby Pian      Created: Tue Feb 16, 2014  2:22 PM       Retroperitoneal perforation--medical management.            Needs to be scheduled for a drain injection with radiology in 2 weeks.      Then needs a follow up with Dr. Marlou Starks.            Thanks,      Emina ------

## 2014-02-16 NOTE — Discharge Instructions (Signed)
DRAIN CARE:   You have a closed bulb drain to help you heal.  A bulb drain is a small, plastic reservoir which creates a gentle suction. It is used to remove excess fluid from a surgical wound. The color and amount of fluid will vary. Immediately after surgery, the fluid is bright red. It may gradually change to a yellow color. When the amount decreases to about 1 or 2 tablespoons (15 to 30 cc) per 24 hours, your caregiver will usually remove it.  DAILY CARE  Keep the bulb compressed at all times, except while emptying it. The compression creates suction.   Keep sites where the tubes enter the skin dry and covered with a light bandage (dressing).   Tape the tubes to your skin, 1 to 2 inches below the insertion sites, to keep from pulling on your stitches. Tubes are stitched in place and will not slip out.   Pin the bulb to your shirt (not to your pants) with a safety pin.   Empty the bulb whenever it becomes half full because the bulb does not create enough suction if it is too full. Include this amount in your 24 hour totals.   When the amount of drainage decreases, empty the bulb at the same time every day. Write down the amounts and the 24 hour totals. Your caregiver will want to know them. This helps your caregiver know when the tubes can be removed.   If there is drainage around the tube sites, change dressings and keep the area dry. If you see a clot in the tube, leave it alone. However, if the tube does not appear to be draining, let your caregiver know.  TO EMPTY THE BULB  Open the stopper to release suction.   Holding the stopper out of the way, pour drainage into the measuring cup that was sent home with you.   Measure and write down the amount. If there are 2 bulbs, note the amount of drainage from bulb 1 or bulb 2 and keep the totals separate. Your caregiver will want to know which tube is draining more.   Compress the bulb by folding it in half.   Replace the stopper.    Check the tape that holds the tube to your skin, and pin the bulb to your shirt.  SEEK MEDICAL CARE IF:  The drainage develops a bad odor.   You have an oral temperature above 102 F (38.9 C).   The amount of drainage from your wound suddenly increases or decreases.   You accidentally pull out your drain.   You have any other questions or concerns.  MAKE SURE YOU:   Understand these instructions.   Will watch your condition.   Will get help right away if you are not doing well or get worse.     Call our office if you have any questions about your drain. [place]

## 2014-02-16 NOTE — Telephone Encounter (Signed)
Order put in and given to [facility] to sch.

## 2014-02-16 NOTE — Discharge Planning (Signed)
Spoke to patient and she advised me that she wants seek tub seat at [place] where she thinks it would be cheaper.

## 2014-02-16 NOTE — Discharge Summary (Signed)
Physician Discharge Summary  Brianna Fuller KVQ:[id number] DOB: July 24, 1950 DOA: 01/29/2014  PCP: Cari Caraway, MD  Consultation: IR  Admit date: 01/29/2014 Discharge date: 02/16/2014  Recommendations for Outpatient Follow-up:   Follow-up Information   Follow up with TOTH Joneen Boers, MD. (we will make an appointment on your behalf and notify you.)    Specialty:  General Surgery   Contact information:   [address] [place] [address] [phone]      Discharge Diagnoses:  1. Retroperitoneal duodenal perforation  2. Hypokalemia 3. Fluid overload    Surgical Procedure: IR drain placement 02/05/14  Discharge Condition: stable Disposition: home  Diet recommendation: NPO  Filed Weights   02/12/14 0800 02/14/14 0703 02/16/14 0612  Weight: 131 lb (59.421 kg) 127 lb (57.607 kg) 126 lb 12.8 oz (57.516 kg)       Hospital Course:  Brianna Fuller presented to [facility] with abdominal pain. CT showed a retroperitoneal perforation which was contained.  Because of the difficulty of surgery, she was admitted to the ICU for close monitoring.  She was started on TPN for nutritional support, IV antibiotics, PPI.  She was found to have hypokalemia which was supplemented PRN.  She was also found to have fluid overload which resolved with lasix.  Repeat CT of abdomen showed ongoing perforation and abscess and therefore an IR perc drain was placed on 02/05/14.  Cultures showed Klebsiella and invanz was changed to Primaxin, course completed and antibiotics were stopped.   Drain was injected on 9/2 which showed a persistent duodenal leak.  She remained stable.  White count normal.  Afebrile.  On HD#18 the patient was felt stable for discharge home with TPN, IV protonix, fentanyl and drain.  Medication risks, benefits and therapeutic alternatives were reviewed with the patient.  She verbalizes understanding.  She will follow up with Dr. Marlou Starks, we'll schedule follow up on her behalf.  Will  also schedule a drain injection.   Physical Exam: General appearance: alert and oriented. Calm and cooperative No acute distress. VSS. Afebrile.  Resp: clear to auscultation bilaterally  Cardio: S1S1 RRR without murmurs or gallops. No edema. GI: soft round and nontender. +BS x4 quadrants. RUQ with purulent drainage.  No organomegaly, hernias or masses.  Pulses: +2 bilateral distal pulses without cyanosis    Discharge Instructions     Medication List    STOP taking these medications       CALCIUM + D PO     estrogen (conjugated)-medroxyprogesterone 0.3-1.5 MG per tablet  Commonly known as:  PREMPRO     ibuprofen 200 MG tablet  Commonly known as:  ADVIL,MOTRIN     loratadine 10 MG tablet  Commonly known as:  CLARITIN     VITAMIN C PO     Vitamin D 2000 UNITS Caps      TAKE these medications       enoxaparin 40 MG/0.4ML injection  Commonly known as:  LOVENOX  Inject 0.4 mLs (40 mg total) into the skin daily.     fentaNYL 25 MCG/HR patch  Commonly known as:  DURAGESIC - dosed mcg/hr  Place 1 patch (25 mcg total) onto the skin every 3 (three) days.     pantoprazole 40 MG injection  Commonly known as:  PROTONIX  Inject 40 mg into the vein every 12 (twelve) hours.           Follow-up Information   Follow up with TOTH Joneen Boers, MD. (we will make an appointment on  your behalf and notify you.)    Specialty:  General Surgery   Contact information:   [facility] [place] [address] [phone]        The results of significant diagnostics from this hospitalization (including imaging, microbiology, ancillary and laboratory) are listed below for reference.    Significant Diagnostic Studies: Dg Chest 2 View  01/30/2014   CLINICAL DATA:  Chest pain  EXAM: CHEST  2 VIEW  COMPARISON:  None available.  FINDINGS: The cardiac and mediastinal silhouettes are within normal limits.  The lungs are normally inflated. Bibasilar linear opacities are most  consistent with atelectasis, left greater than right. No airspace consolidation, pleural effusion, or pulmonary edema is identified. There is no pneumothorax.  No acute osseous abnormality identified.  IMPRESSION: Mild bibasilar atelectasis. No other acute cardiopulmonary abnormality.   Electronically Signed   By: Jeannine Boga M.D.   On: 01/30/2014 00:37   Ct Abdomen Pelvis W Contrast  02/05/2014   CLINICAL DATA:  Possible duodenum perforation.  Abdominal pain.  EXAM: CT ABDOMEN AND PELVIS WITH CONTRAST  TECHNIQUE: Multidetector CT imaging of the abdomen and pelvis was performed using the standard protocol following bolus administration of intravenous contrast.  CONTRAST:  141mL OMNIPAQUE IOHEXOL 300 MG/ML  SOLN  COMPARISON:  CT scan of January 30, 2014.  FINDINGS: Mild bilateral pleural effusions with adjacent subsegmental atelectasis are noted with right greater than left. No definite osseous abnormality is noted.  Sludge is noted within the gallbladder. No focal abnormality is noted in the liver, spleen or pancreas. Adrenal glands appear normal. No hydronephrosis or renal obstruction is noted. There is no evidence of bowel obstruction. There is again noted severe wall thickening and surrounding inflammation involving the second and third portions of the duodenum consistent with duodenitis or peptic ulcer disease. Perforation is seen along the superior aspect of the third portion of the duodenum which connects to adjacent fluid collection described on prior exam, best seen on image numbers 28 and 29 of the coronal reconstructions. This collection is enlarged compared to prior exam, now measuring 6.1 x 3.8 x 2.4 cm. It contains fluid and gas. It is seen to extend into the inferior and anterior portion of the right perinephric space where it is immediately adjacent to the lower pole of the right kidney. It demonstrates peripheral enhancement suggesting developing abscess in this area. Urinary bladder and  uterus appear normal. Mild anasarca is noted.  IMPRESSION: Bilateral pleural effusions are noted with adjacent subsegmental atelectasis, right greater than left.  There is continued severe inflammation involving the second and third portion of the duodenum secondary to duodenitis or peptic ulcer disease. Perforation is seen involving the superior aspect of the third portion the duodenum which extends into previously described fluid collection adjacent to the duodenum and posterior to the gallbladder. This fluid collection is increased in size, now measuring 6.1 x 3.8 x 2.4 cm. It is seen to extend into the inferior portion of the right perinephric base as noted on prior exam, but now demonstrates peripherally enhancing walls consistent with developing abscess anterior to the lower pole of the right kidney. These results will be called to the ordering clinician or representative by the Radiologist Assistant, and communication documented in the PACS or zVision Dashboard.   Electronically Signed   By: Sabino Dick M.D.   On: 02/05/2014 08:29   Ct Abdomen Pelvis W Contrast  01/30/2014   CLINICAL DATA:  Right abdominal pain, fever.  EXAM:  CT ABDOMEN AND PELVIS WITH CONTRAST  TECHNIQUE: Multidetector CT imaging of the abdomen and pelvis was performed using the standard protocol following bolus administration of intravenous contrast.  CONTRAST:  142mL OMNIPAQUE IOHEXOL 300 MG/ML  SOLN  COMPARISON:  None.  FINDINGS: Patchy bibasilar opacities present within the partially visualized lungs are most consistent with atelectasis.  Liver demonstrates a normal contrast enhanced appearance. Prominent periportal edema noted.  Gallbladder within normal limits. No biliary dilatation. The spleen, adrenal glands, and pancreas demonstrate a normal contrast enhanced appearance.  Kidneys are equal in size with symmetric enhancement. No nephrolithiasis, hydronephrosis, or focal enhancing renal mass. Subcentimeter hypodensity within the  mid left kidney is too small the characterize, but statistically likely represents a small cyst.  Stomach within normal limits. There is extensive wall thickening with inflammatory stranding and free fluid about the second-third portion of the duodenum in the right upper quadrant (series 2, image 42). There is a loculated gas and fluid collection measuring 2.5 x 3.4 x 4.5 cm in the right abdomen just lateral to the inflamed duodenum (series 2, image 38). This collection is located just inferior and posterior to the gallbladder. Free fluid extends inferiorly within the right hemi abdomen and adjacent to the right kidney. Findings are concerning for acute duodenitis with associated perforation, or possibly perforated duodenum ulcer.  Distally, there is no evidence of bowel obstruction. Contrast material is present within the cecum. The appendix is not definitely visualized, however, no secondary signs of acute appendicitis seen within the right lower quadrant. The colon is within normal limits.  Bladder is unremarkable.  Uterus and ovaries within normal limits.  No free air identified. Normal intravascular enhancement seen throughout the abdomen and pelvis.  No acute osseus abnormality. No worrisome lytic or blastic osseous lesions.  IMPRESSION: Abnormal wall thickening with inflammatory stranding and free fluid about the second- third portion of the duodenum with loculated extraluminal 2.5 x 3.4 x 4.5 cm gas and fluid collection adjacent to the duodenum as above. Findings may reflect an acute duodenitis with perforation or possibly a perforated duodenal ulcer. This collection is suspected to be retroperitoneal in location given its proximity to the pancreas and second portion of the duodenum, as well as the presence of free fluid tracking adjacent to the right kidney. Additionally, the lack of pneumoperitoneum also suggests that this is retroperitoneal in location.  Critical Value/emergent results were called by  telephone at the time of interpretation on 01/30/2014 at 3:40 am to Dr. Alvina Chou , who verbally acknowledged these results.   Electronically Signed   By: Jeannine Boga M.D.   On: 01/30/2014 03:39   Ir Sinus/fist Tube Chk-non Gi  02/10/2014   CLINICAL DATA:  63 year old with a contained duodenal perforation. Post percutaneous drain placement and scheduled for a drain injection.  EXAM: SINUS TRACT INJECTION/FISTULOGRAM  Physician: Stephan Minister. Anselm Pancoast, MD  FLUOROSCOPY TIME:  54 seconds  MEDICATIONS: None  ANESTHESIA/SEDATION: Moderate sedation time: None  CONTRAST:  15 mL Omnipaque 300  PROCEDURE: The patient was placed supine on the interventional table. Contrast was injected through the catheter under fluoroscopy. Catheter was flushed with normal saline at the end of the procedure.  FINDINGS: Scout image demonstrates contrast in the transverse colon. Pigtail catheter in the right abdomen. Contrast injection demonstrates opacification of theduodenum. Contrast identified in third and fourth portions of the duodenum. Findings consistent with a duodenum perforation likely in the region of D2. Small irregular collection around the catheter.  COMPLICATIONS: None  IMPRESSION:  Drain injection demonstrates opacification of the duodenum. Findings consistent with a duodenum perforation. Small irregular collection around the drainage catheter.   Electronically Signed   By: Markus Daft M.D.   On: 02/10/2014 09:03   Ct Image Guided Drainage By Percutaneous Catheter  02/05/2014   INDICATION: History of contained duodenal perforation with abscess formation. Please perform CT-guided percutaneous drainage catheter placement.  EXAM: CT IMAGE GUIDED DRAINAGE BY PERCUTANEOUS CATHETER  COMPARISON:  CT abdomen pelvis - 01/30/2014; 02/05/2014  MEDICATIONS: The patient is currently admitted to the hospital and receiving intravenous antibiotics. The antibiotics were administered within an appropriate time frame prior to the  initiation of the procedure.  ANESTHESIA/SEDATION: Fentanyl 100 mcg IV; Versed 3 mg IV  Total Moderate Sedation time  10 minutes  CONTRAST:  None  COMPLICATIONS: None immediate  PROCEDURE: Informed written consent was obtained from the patient after a discussion of the risks, benefits and alternatives to treatment. The patient was placed supine on the CT gantry and a pre procedural CT was performed re-demonstrating the known ill-defined abscess/fluid collection within the superior aspect of the right retroperitoneum adjacent to the descending portion of the duodenum (representative images 23, 28 and 33, series 3). The procedure was planned. A timeout was performed prior to the initiation of the procedure.  The right lateral abdomen was prepped and draped in the usual sterile fashion. The overlying soft tissues were anesthetized with 1% lidocaine with epinephrine. Appropriate trajectory was planned with the use of a 22 gauge spinal needle. An 18 gauge trocar needle was advanced into the abscess/fluid collection and a short Amplatz super stiff wire was coiled within the collection. Appropriate positioning was confirmed with a limited CT scan. The tract was serially dilated allowing placement of a 10 [place] all-purpose drainage catheter. Appropriate positioning was confirmed with a limited postprocedural CT scan.  Following percutaneous drainage catheter placement, approximately 10 ml of purulent fluid was aspirated. The tube was connected to a drainage bag and sutured in place. A dressing was placed. The patient tolerated the procedure well without immediate post procedural complication.  IMPRESSION: Successful CT guided placement of a 10 French all purpose drain catheter into the ill-defined abscess adjacent to the duodenum within the superior aspect of the right retroperitoneum with aspiration of approximately 10 mL of purulent fluid. Samples were sent to the laboratory as requested by the ordering clinical team.    Electronically Signed   By: Sandi Mariscal M.D.   On: 02/05/2014 15:58    Microbiology: No results found for this or any previous visit (from the past 240 hour(s)).   Labs: Basic Metabolic Panel:  Recent Labs Lab 02/10/14 0005 02/11/14 0510 02/14/14 0500 02/15/14 0500  NA 135* 138 138 138  K 4.4 4.5 4.3 4.4  CL 98 99 102 103  CO2 27 28 25 25   GLUCOSE 122* 111* 97 94  BUN 16 17 23  24*  CREATININE 0.52 0.54 0.48* 0.51  CALCIUM 8.5 8.8 8.8 8.7  MG  --  2.3  --  2.1  PHOS  --  4.1  --  3.9   Liver Function Tests:  Recent Labs Lab 02/11/14 0510 02/15/14 0500  AST 40* 61*  ALT 60* 90*  ALKPHOS 226* 213*  BILITOT 0.5 0.4  PROT 6.7 6.6  ALBUMIN 2.9* 3.1*   No results found for this basename: LIPASE, AMYLASE,  in the last 168 hours No results found for this basename: AMMONIA,  in the last 168 hours CBC:  Recent Labs  Lab 02/10/14 0005 02/14/14 0500 02/15/14 0500  WBC 7.8 6.3 5.9  NEUTROABS  --   --  3.5  HGB 9.7* 10.2* 10.1*  HCT 29.4* 32.0* 31.3*  MCV 93.6 91.7 94.3  PLT 323 425* 389   Cardiac Enzymes: No results found for this basename: CKTOTAL, CKMB, CKMBINDEX, TROPONINI,  in the last 168 hours BNP: BNP (last 3 results)  Recent Labs  02/04/14 0522  PROBNP 467.6*   CBG: No results found for this basename: GLUCAP,  in the last 168 hours  Active Problems:   Duodenal perforation   Time coordinating discharge: <30 mins  Signed:  Malakie Balis, ANP-BC

## 2014-02-16 NOTE — Discharge Summary (Signed)
I have seen and examined the patient and agree with the assessment and plans.  Aleza Pew A. Chalet Kerwin  MD, FACS  

## 2014-02-17 ENCOUNTER — Telehealth (INDEPENDENT_AMBULATORY_CARE_PROVIDER_SITE_OTHER): Payer: Self-pay | Admitting: General Surgery

## 2014-02-17 NOTE — Telephone Encounter (Signed)
Home health called asking if patient needed labs drawn.  Recommended that she call during normal business hours and discuss with Dr Marlou Starks

## 2014-02-27 ENCOUNTER — Telehealth (INDEPENDENT_AMBULATORY_CARE_PROVIDER_SITE_OTHER): Payer: Self-pay | Admitting: Surgery

## 2014-02-27 NOTE — Telephone Encounter (Signed)
[name] called and said that she has thrush.  She was recently hospitalized for a perforated ulcer.  She is home - NPO on TPN.  She said that [name] was going to call something in for the thrush, but did not know what it is.  I called [facility] on [address] [phone]) and left a prescription for Nystatin 5 cc QID (given 140 cc total).  [name], MD, [facility] Surgery Pager: [phone] Office phone:  [phone]

## 2014-03-02 ENCOUNTER — Ambulatory Visit (HOSPITAL_COMMUNITY)
Admission: RE | Admit: 2014-03-02 | Discharge: 2014-03-02 | Disposition: A | Discharge status: Home / Self Care | Payer: BC Managed Care – PPO | Source: Ambulatory Visit | Attending: General Surgery | Admitting: General Surgery

## 2014-03-02 ENCOUNTER — Other Ambulatory Visit (HOSPITAL_COMMUNITY): Payer: Self-pay | Admitting: Interventional Radiology

## 2014-03-02 DIAGNOSIS — K316 Fistula of stomach and duodenum: Secondary | ICD-10-CM | POA: Diagnosis not present

## 2014-03-02 DIAGNOSIS — K631 Perforation of intestine (nontraumatic): Secondary | ICD-10-CM

## 2014-03-02 MED ORDER — IOHEXOL 300 MG/ML  SOLN
50.0000 mL | Freq: Once | INTRAMUSCULAR | Status: AC | PRN
  Administered 2014-03-02: 10 mL via INTRAVENOUS

## 2014-03-08 ENCOUNTER — Other Ambulatory Visit (INDEPENDENT_AMBULATORY_CARE_PROVIDER_SITE_OTHER): Payer: Self-pay

## 2014-03-08 DIAGNOSIS — K631 Perforation of intestine (nontraumatic): Secondary | ICD-10-CM

## 2014-03-10 ENCOUNTER — Telehealth (INDEPENDENT_AMBULATORY_CARE_PROVIDER_SITE_OTHER): Payer: Self-pay

## 2014-03-10 NOTE — Telephone Encounter (Signed)
Pt aware of IR study appt

## 2014-03-11 ENCOUNTER — Ambulatory Visit (HOSPITAL_COMMUNITY)
Admission: RE | Admit: 2014-03-11 | Discharge: 2014-03-11 | Disposition: A | Discharge status: Home / Self Care | Payer: BC Managed Care – PPO | Source: Ambulatory Visit | Attending: Interventional Radiology | Admitting: Interventional Radiology

## 2014-03-11 ENCOUNTER — Other Ambulatory Visit (HOSPITAL_COMMUNITY): Payer: Self-pay | Admitting: Interventional Radiology

## 2014-03-11 DIAGNOSIS — Z4659 Encounter for fitting and adjustment of other gastrointestinal appliance and device: Secondary | ICD-10-CM | POA: Diagnosis not present

## 2014-03-11 DIAGNOSIS — K265 Chronic or unspecified duodenal ulcer with perforation: Secondary | ICD-10-CM | POA: Insufficient documentation

## 2014-03-11 DIAGNOSIS — K631 Perforation of intestine (nontraumatic): Secondary | ICD-10-CM

## 2014-03-11 MED ORDER — IOHEXOL 300 MG/ML  SOLN
50.0000 mL | Freq: Once | INTRAMUSCULAR | Status: AC | PRN
  Administered 2014-03-11: 10 mL

## 2014-03-11 MED ORDER — LIDOCAINE HCL 1 % IJ SOLN
INTRAMUSCULAR | Status: AC
  Filled 2014-03-11: qty 20

## 2014-03-11 NOTE — Procedures (Signed)
RUQ drain exchange and repositioning 10 Fr Persistent fistual No comp

## 2014-03-16 ENCOUNTER — Ambulatory Visit (HOSPITAL_COMMUNITY): Admission: RE | Admit: 2014-03-16 | Payer: BC Managed Care – PPO | Source: Ambulatory Visit

## 2014-03-16 ENCOUNTER — Telehealth (INDEPENDENT_AMBULATORY_CARE_PROVIDER_SITE_OTHER): Payer: Self-pay

## 2014-03-16 NOTE — Telephone Encounter (Signed)
Pt is calling to see what Dr Ethlyn Gallery thoughts are since she had her IR test on 03/11/14. Pt states that they were not able to shorten the tube at that time. Pt wanted to know if they were any other options that she can do from this point. Informed pt that I would send Dr Marlou Starks a message and we would contact her as soon as we receive a response. Pt verbalized understanding

## 2014-03-18 NOTE — Telephone Encounter (Signed)
Nothing else to do right now. i will have to talk to IR

## 2014-03-31 ENCOUNTER — Other Ambulatory Visit (HOSPITAL_COMMUNITY): Payer: Self-pay | Admitting: Diagnostic Radiology

## 2014-03-31 ENCOUNTER — Ambulatory Visit (HOSPITAL_COMMUNITY)
Admission: RE | Admit: 2014-03-31 | Discharge: 2014-03-31 | Disposition: A | Discharge status: Home / Self Care | Payer: BC Managed Care – PPO | Source: Ambulatory Visit | Attending: General Surgery | Admitting: General Surgery

## 2014-03-31 DIAGNOSIS — K316 Fistula of stomach and duodenum: Secondary | ICD-10-CM | POA: Diagnosis not present

## 2014-03-31 DIAGNOSIS — K631 Perforation of intestine (nontraumatic): Secondary | ICD-10-CM

## 2014-03-31 MED ORDER — IOHEXOL 300 MG/ML  SOLN
20.0000 mL | Freq: Once | INTRAMUSCULAR | Status: AC | PRN
  Administered 2014-03-31: 20 mL

## 2014-03-31 MED ORDER — LIDOCAINE HCL 1 % IJ SOLN
INTRAMUSCULAR | Status: AC
  Filled 2014-03-31: qty 20

## 2014-03-31 NOTE — Procedures (Signed)
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  The existing tube was removed and exchanged for a 10 French urethral catheter.  Tip positioned away from the duodenum.   Tube secured to the skin.   Will see patient back in one week for follow up drain injection and possible reposition.

## 2014-04-06 ENCOUNTER — Telehealth (HOSPITAL_COMMUNITY): Payer: Self-pay | Admitting: Radiology

## 2014-04-06 NOTE — Telephone Encounter (Signed)
Pt called to confirm appt for tomorrow 10-28 @ [facility] with Dr. Anselm Pancoast.  Arrive at 0800.  Also asked if a PA could call her back.  She is wanting to know if she should flush her drain catheter.  Output has decreased.  Pt stated it was ok for PA to leave a voicemail at [phone].  Will pass this question over to the [facility] IR PA to return her call.

## 2014-04-07 ENCOUNTER — Ambulatory Visit (HOSPITAL_COMMUNITY)
Admission: RE | Admit: 2014-04-07 | Discharge: 2014-04-07 | Disposition: A | Discharge status: Home / Self Care | Payer: BC Managed Care – PPO | Source: Ambulatory Visit | Attending: Diagnostic Radiology | Admitting: Diagnostic Radiology

## 2014-04-07 DIAGNOSIS — Z09 Encounter for follow-up examination after completed treatment for conditions other than malignant neoplasm: Secondary | ICD-10-CM | POA: Insufficient documentation

## 2014-04-07 DIAGNOSIS — K631 Perforation of intestine (nontraumatic): Secondary | ICD-10-CM

## 2014-04-07 MED ORDER — IOHEXOL 300 MG/ML  SOLN
50.0000 mL | Freq: Once | INTRAMUSCULAR | Status: AC | PRN
  Administered 2014-04-07: 5 mL

## 2014-04-14 ENCOUNTER — Other Ambulatory Visit (INDEPENDENT_AMBULATORY_CARE_PROVIDER_SITE_OTHER): Payer: Self-pay

## 2014-04-14 DIAGNOSIS — K631 Perforation of intestine (nontraumatic): Secondary | ICD-10-CM

## 2014-04-16 ENCOUNTER — Ambulatory Visit
Admission: RE | Admit: 2014-04-16 | Discharge: 2014-04-16 | Disposition: A | Discharge status: Home / Self Care | Payer: BC Managed Care – PPO | Source: Ambulatory Visit | Attending: General Surgery | Admitting: General Surgery

## 2014-04-16 DIAGNOSIS — K631 Perforation of intestine (nontraumatic): Secondary | ICD-10-CM

## 2014-04-16 MED ORDER — IOHEXOL 300 MG/ML  SOLN
100.0000 mL | Freq: Once | INTRAMUSCULAR | Status: AC | PRN
  Administered 2014-04-16: 100 mL via INTRAVENOUS

## 2014-04-21 ENCOUNTER — Other Ambulatory Visit (INDEPENDENT_AMBULATORY_CARE_PROVIDER_SITE_OTHER): Payer: Self-pay

## 2014-04-22 ENCOUNTER — Other Ambulatory Visit (INDEPENDENT_AMBULATORY_CARE_PROVIDER_SITE_OTHER): Payer: Self-pay

## 2014-04-22 DIAGNOSIS — Z95828 Presence of other vascular implants and grafts: Secondary | ICD-10-CM

## 2014-06-23 ENCOUNTER — Other Ambulatory Visit: Payer: Self-pay | Admitting: Radiology

## 2014-06-30 ENCOUNTER — Encounter: Payer: Self-pay | Admitting: *Deleted

## 2014-07-06 ENCOUNTER — Other Ambulatory Visit (INDEPENDENT_AMBULATORY_CARE_PROVIDER_SITE_OTHER): Payer: Self-pay | Admitting: General Surgery

## 2014-07-06 DIAGNOSIS — N6092 Unspecified benign mammary dysplasia of left breast: Secondary | ICD-10-CM

## 2014-07-27 ENCOUNTER — Encounter (HOSPITAL_BASED_OUTPATIENT_CLINIC_OR_DEPARTMENT_OTHER): Payer: Self-pay | Admitting: *Deleted

## 2014-07-28 ENCOUNTER — Encounter (HOSPITAL_BASED_OUTPATIENT_CLINIC_OR_DEPARTMENT_OTHER): Payer: Self-pay | Admitting: *Deleted

## 2014-07-28 NOTE — Progress Notes (Signed)
No labs needed-preop teaching done

## 2014-08-02 ENCOUNTER — Ambulatory Visit (HOSPITAL_BASED_OUTPATIENT_CLINIC_OR_DEPARTMENT_OTHER): Payer: BLUE CROSS/BLUE SHIELD | Admitting: Certified Registered"

## 2014-08-02 ENCOUNTER — Encounter (HOSPITAL_BASED_OUTPATIENT_CLINIC_OR_DEPARTMENT_OTHER): Payer: Self-pay | Admitting: Certified Registered"

## 2014-08-02 ENCOUNTER — Ambulatory Visit (HOSPITAL_BASED_OUTPATIENT_CLINIC_OR_DEPARTMENT_OTHER)
Admission: RE | Admit: 2014-08-02 | Discharge: 2014-08-02 | Disposition: A | Discharge status: Home / Self Care | Payer: BLUE CROSS/BLUE SHIELD | Source: Ambulatory Visit | Attending: General Surgery | Admitting: General Surgery

## 2014-08-02 ENCOUNTER — Encounter (HOSPITAL_BASED_OUTPATIENT_CLINIC_OR_DEPARTMENT_OTHER)
Admission: RE | Disposition: A | Discharge status: Home / Self Care | Payer: Self-pay | Source: Ambulatory Visit | Attending: General Surgery

## 2014-08-02 DIAGNOSIS — R921 Mammographic calcification found on diagnostic imaging of breast: Secondary | ICD-10-CM | POA: Diagnosis not present

## 2014-08-02 DIAGNOSIS — I10 Essential (primary) hypertension: Secondary | ICD-10-CM | POA: Diagnosis not present

## 2014-08-02 DIAGNOSIS — K219 Gastro-esophageal reflux disease without esophagitis: Secondary | ICD-10-CM | POA: Insufficient documentation

## 2014-08-02 DIAGNOSIS — N6092 Unspecified benign mammary dysplasia of left breast: Secondary | ICD-10-CM | POA: Diagnosis not present

## 2014-08-02 DIAGNOSIS — Z79899 Other long term (current) drug therapy: Secondary | ICD-10-CM | POA: Diagnosis not present

## 2014-08-02 HISTORY — DX: Perforation of intestine (nontraumatic): K63.1

## 2014-08-02 HISTORY — DX: Presence of spectacles and contact lenses: Z97.3

## 2014-08-02 HISTORY — PX: BREAST LUMPECTOMY WITH RADIOACTIVE SEED LOCALIZATION: SHX6424

## 2014-08-02 LAB — POCT HEMOGLOBIN-HEMACUE: HEMOGLOBIN: 12.3 g/dL (ref 12.0–15.0)

## 2014-08-02 SURGERY — BREAST LUMPECTOMY WITH RADIOACTIVE SEED LOCALIZATION
Anesthesia: General | Site: Breast | Laterality: Left

## 2014-08-02 MED ORDER — OXYCODONE HCL 5 MG/5ML PO SOLN: 5.0000 mg | Freq: Once | ORAL | Status: AC | PRN

## 2014-08-02 MED ORDER — LIDOCAINE HCL (CARDIAC) 20 MG/ML IV SOLN
INTRAVENOUS | Status: DC | PRN
  Administered 2014-08-02: 60 mg via INTRAVENOUS

## 2014-08-02 MED ORDER — FENTANYL CITRATE 0.05 MG/ML IJ SOLN
INTRAMUSCULAR | Status: DC | PRN
  Administered 2014-08-02: 50 ug via INTRAVENOUS
  Administered 2014-08-02 (×2): 25 ug via INTRAVENOUS

## 2014-08-02 MED ORDER — LACTATED RINGERS IV SOLN
INTRAVENOUS | Status: DC
  Administered 2014-08-02 (×2): via INTRAVENOUS

## 2014-08-02 MED ORDER — VANCOMYCIN HCL IN DEXTROSE 1-5 GM/200ML-% IV SOLN
INTRAVENOUS | Status: AC
  Filled 2014-08-02: qty 200

## 2014-08-02 MED ORDER — BUPIVACAINE-EPINEPHRINE (PF) 0.25% -1:200000 IJ SOLN
INTRAMUSCULAR | Status: AC
  Filled 2014-08-02: qty 30

## 2014-08-02 MED ORDER — ONDANSETRON HCL 4 MG/2ML IJ SOLN
INTRAMUSCULAR | Status: DC | PRN
  Administered 2014-08-02: 4 mg via INTRAVENOUS

## 2014-08-02 MED ORDER — DEXAMETHASONE SODIUM PHOSPHATE 4 MG/ML IJ SOLN
INTRAMUSCULAR | Status: DC | PRN
  Administered 2014-08-02: 10 mg via INTRAVENOUS

## 2014-08-02 MED ORDER — ONDANSETRON HCL 4 MG/2ML IJ SOLN: 4.0000 mg | Freq: Four times a day (QID) | INTRAMUSCULAR | Status: DC | PRN

## 2014-08-02 MED ORDER — BUPIVACAINE HCL (PF) 0.25 % IJ SOLN
INTRAMUSCULAR | Status: AC
  Filled 2014-08-02: qty 30

## 2014-08-02 MED ORDER — BUPIVACAINE HCL (PF) 0.25 % IJ SOLN
INTRAMUSCULAR | Status: DC | PRN
  Administered 2014-08-02: 10 mL

## 2014-08-02 MED ORDER — MIDAZOLAM HCL 2 MG/2ML IJ SOLN
1.0000 mg | INTRAMUSCULAR | Status: DC | PRN
Start: 2014-08-02 — End: 2014-08-02

## 2014-08-02 MED ORDER — MIDAZOLAM HCL 2 MG/2ML IJ SOLN
INTRAMUSCULAR | Status: AC
  Filled 2014-08-02: qty 2

## 2014-08-02 MED ORDER — CHLORHEXIDINE GLUCONATE 4 % EX LIQD: 1.0000 "application " | Freq: Once | CUTANEOUS | Status: DC

## 2014-08-02 MED ORDER — OXYCODONE HCL 5 MG PO TABS
5.0000 mg | ORAL_TABLET | Freq: Once | ORAL | Status: AC | PRN
  Administered 2014-08-02: 5 mg via ORAL

## 2014-08-02 MED ORDER — OXYCODONE HCL 5 MG PO TABS
ORAL_TABLET | ORAL | Status: AC
  Filled 2014-08-02: qty 1

## 2014-08-02 MED ORDER — MIDAZOLAM HCL 5 MG/5ML IJ SOLN
INTRAMUSCULAR | Status: DC | PRN
  Administered 2014-08-02: 2 mg via INTRAVENOUS

## 2014-08-02 MED ORDER — FENTANYL CITRATE 0.05 MG/ML IJ SOLN
INTRAMUSCULAR | Status: AC
  Filled 2014-08-02: qty 6

## 2014-08-02 MED ORDER — FENTANYL CITRATE 0.05 MG/ML IJ SOLN: 25.0000 ug | INTRAMUSCULAR | Status: DC | PRN

## 2014-08-02 MED ORDER — PROPOFOL 10 MG/ML IV BOLUS
INTRAVENOUS | Status: DC | PRN
  Administered 2014-08-02: 150 mg via INTRAVENOUS

## 2014-08-02 MED ORDER — VANCOMYCIN HCL IN DEXTROSE 1-5 GM/200ML-% IV SOLN
1000.0000 mg | INTRAVENOUS | Status: AC
  Administered 2014-08-02: 1000 mg via INTRAVENOUS

## 2014-08-02 MED ORDER — OXYCODONE-ACETAMINOPHEN 5-325 MG PO TABS: 1.0000 | ORAL_TABLET | ORAL | Status: DC | PRN

## 2014-08-02 MED ORDER — FENTANYL CITRATE 0.05 MG/ML IJ SOLN
50.0000 ug | INTRAMUSCULAR | Status: DC | PRN
Start: 2014-08-02 — End: 2014-08-02

## 2014-08-02 SURGICAL SUPPLY — 38 items
APPLIER CLIP 9.375 MED OPEN (MISCELLANEOUS) ×2
BLADE SURG 15 STRL LF DISP TIS (BLADE) ×1 IMPLANT
BLADE SURG 15 STRL SS (BLADE) ×1
CANISTER SUC SOCK COL 7IN (MISCELLANEOUS) IMPLANT
CANISTER SUCT 1200ML W/VALVE (MISCELLANEOUS) ×2 IMPLANT
CHLORAPREP W/TINT 26ML (MISCELLANEOUS) ×2 IMPLANT
CLIP APPLIE 9.375 MED OPEN (MISCELLANEOUS) ×1 IMPLANT
COVER BACK TABLE 60X90IN (DRAPES) ×2 IMPLANT
COVER MAYO STAND STRL (DRAPES) ×2 IMPLANT
COVER PROBE W GEL 5X96 (DRAPES) ×2 IMPLANT
DECANTER SPIKE VIAL GLASS SM (MISCELLANEOUS) IMPLANT
DEVICE DUBIN W/COMP PLATE 8390 (MISCELLANEOUS) ×2 IMPLANT
DRAPE LAPAROSCOPIC ABDOMINAL (DRAPES) ×2 IMPLANT
DRAPE UTILITY XL STRL (DRAPES) ×2 IMPLANT
ELECT COATED BLADE 2.86 ST (ELECTRODE) ×2 IMPLANT
ELECT REM PT RETURN 9FT ADLT (ELECTROSURGICAL) ×2
ELECTRODE REM PT RTRN 9FT ADLT (ELECTROSURGICAL) ×1 IMPLANT
GLOVE BIO SURGEON STRL SZ 6.5 (GLOVE) ×4 IMPLANT
GLOVE BIO SURGEON STRL SZ7.5 (GLOVE) ×2 IMPLANT
GLOVE EXAM NITRILE EXT CUFF MD (GLOVE) ×2 IMPLANT
GOWN STRL REUS W/ TWL LRG LVL3 (GOWN DISPOSABLE) ×2 IMPLANT
GOWN STRL REUS W/TWL LRG LVL3 (GOWN DISPOSABLE) ×2
KIT MARKER MARGIN INK (KITS) ×2 IMPLANT
LIQUID BAND (GAUZE/BANDAGES/DRESSINGS) ×2 IMPLANT
NEEDLE HYPO 25X1 1.5 SAFETY (NEEDLE) ×2 IMPLANT
NS IRRIG 1000ML POUR BTL (IV SOLUTION) ×2 IMPLANT
PACK BASIN DAY SURGERY FS (CUSTOM PROCEDURE TRAY) ×2 IMPLANT
PENCIL BUTTON HOLSTER BLD 10FT (ELECTRODE) ×2 IMPLANT
SLEEVE SCD COMPRESS KNEE MED (MISCELLANEOUS) ×2 IMPLANT
SPONGE LAP 18X18 X RAY DECT (DISPOSABLE) ×2 IMPLANT
SUT MON AB 4-0 PC3 18 (SUTURE) ×2 IMPLANT
SUT SILK 2 0 SH (SUTURE) ×2 IMPLANT
SUT VICRYL 3-0 CR8 SH (SUTURE) ×2 IMPLANT
SYR CONTROL 10ML LL (SYRINGE) ×2 IMPLANT
TOWEL OR 17X24 6PK STRL BLUE (TOWEL DISPOSABLE) ×2 IMPLANT
TOWEL OR NON WOVEN STRL DISP B (DISPOSABLE) IMPLANT
TUBE CONNECTING 20X1/4 (TUBING) ×2 IMPLANT
YANKAUER SUCT BULB TIP NO VENT (SUCTIONS) ×2 IMPLANT

## 2014-08-02 NOTE — Interval H&P Note (Signed)
History and Physical Interval Note:  08/02/2014 11:48 AM  Brianna Fuller  has presented today for surgery, with the diagnosis of Left Breast Atypical Hyperplasia  The various methods of treatment have been discussed with the patient and family. After consideration of risks, benefits and other options for treatment, the patient has consented to  Procedure(s): BREAST LUMPECTOMY WITH RADIOACTIVE SEED LOCALIZATION (Left) as a surgical intervention .  The patient's history has been reviewed, patient examined, no change in status, stable for surgery.  I have reviewed the patient's chart and labs.  Questions were answered to the patient's satisfaction.     TOTH III,Surya Schroeter S

## 2014-08-02 NOTE — Anesthesia Procedure Notes (Signed)
Procedure Name: LMA Insertion Date/Time: 08/02/2014 1:56 PM Performed by: Madysyn Hanken Pre-anesthesia Checklist: Patient identified, Emergency Drugs available, Suction available and Patient being monitored Patient Re-evaluated:Patient Re-evaluated prior to inductionOxygen Delivery Method: Circle System Utilized Preoxygenation: Pre-oxygenation with 100% oxygen Intubation Type: IV induction Ventilation: Mask ventilation without difficulty LMA: LMA inserted LMA Size: 4.0 Number of attempts: 1 Airway Equipment and Method: Bite block Placement Confirmation: positive ETCO2 Tube secured with: Tape Dental Injury: Teeth and Oropharynx as per pre-operative assessment

## 2014-08-02 NOTE — Op Note (Signed)
08/02/2014  2:46 PM  PATIENT:  Brianna Fuller  64 y.o. female  PRE-OPERATIVE DIAGNOSIS:  Left Breast Atypical Duct Hyperplasia  POST-OPERATIVE DIAGNOSIS:  Left Breast Atypical Duct Hyperplasia  PROCEDURE:  Procedure(s): BREAST LUMPECTOMY WITH RADIOACTIVE SEED LOCALIZATION (Left)  SURGEON:  Surgeon(s) and Role:    * Jovita Kussmaul, MD - Primary  PHYSICIAN ASSISTANT:   ASSISTANTS: none   ANESTHESIA:   general  EBL:  Total I/O In: 1200 [I.V.:1200] Out: -   BLOOD ADMINISTERED:none  DRAINS: none   LOCAL MEDICATIONS USED:  MARCAINE     SPECIMEN:  Source of Specimen:  left breast tissue  DISPOSITION OF SPECIMEN:  PATHOLOGY  COUNTS:  YES  TOURNIQUET:  * No tourniquets in log *  DICTATION: .Dragon Dictation  After informed consent was obtained the patient was brought to the operating room and placed in the supine position on the operating room table. After adequate induction of general anesthesia the patient's left breast was prepped with ChloraPrep, allowed to dry, and draped in usual sterile manner. Previously a radioactive seed had been placed in the subareolar area to mark an area of atypical duct hyperplasia. The neoprobe was used to identify the area of radioactivity. The seed is located about 1 cm medial to the clip that is marking the area of atypical duct hyperplasia. A curvilinear incision was made along the outer aspect of the areola of the left breast with a 15 blade knife. This incision was carried through the skin and subcutaneous tissue sharply with the electrocautery. The neoprobe was used to check the position of the radioactivity frequently and in doing so we were able to remove a circular portion of breast tissue around the area of radioactivity. We did air laterally somewhat in order to make sure we got the clip. Once the specimen was removed it was oriented with the appropriate paint colors. Radioactivity was confirmed in the specimen. There was no residual  radioactivity left in the breast. A specimen radiograph was obtained that showed the clip and seed to both be in the specimen. The specimen was then sent to pathology for further evaluation. Hemostasis was achieved using the Bovie electrocautery. The wound was infiltrated with quarter percent Marcaine and irrigated with copious amounts of saline. The deep layer of the wound was then closed with interrupted 3-0 Vicryl stitches. The skin was then closed with interrupted 4-0 Monocryl subcuticular stitches. Dermabond dressings were applied. The patient tolerated the procedure well. At the end of the case all needle sponge and instrument counts were correct. The patient was then awakened and taken to recovery in stable condition.  PLAN OF CARE: Discharge to home after PACU  PATIENT DISPOSITION:  PACU - hemodynamically stable.   Delay start of Pharmacological VTE agent (>24hrs) due to surgical blood loss or risk of bleeding: not applicable

## 2014-08-02 NOTE — Transfer of Care (Signed)
Immediate Anesthesia Transfer of Care Note  Patient: Brianna Fuller  Procedure(s) Performed: Procedure(s): BREAST LUMPECTOMY WITH RADIOACTIVE SEED LOCALIZATION (Left)  Patient Location: PACU  Anesthesia Type:General  Level of Consciousness: awake, alert , oriented and patient cooperative  Airway & Oxygen Therapy: Patient Spontanous Breathing and Patient connected to face mask oxygen  Post-op Assessment: Report given to RN and Post -op Vital signs reviewed and stable  Post vital signs: Reviewed and stable  Last Vitals:  Filed Vitals:   08/02/14 1452  BP:   Pulse: 106  Temp:   Resp:     Complications: No apparent anesthesia complications

## 2014-08-02 NOTE — H&P (Signed)
Brianna Fuller 07/06/2014 9:15 AM Location: [facility] Patient #: [id number] DOB: 11/17/1950 Single / Language: Cleophus Molt / Race: White Female  History of Present Illness Sammuel Hines. Marlou Starks MD; 07/09/2014 1:39 PM) Patient words: Atypical Ductal Hyperplasia.  The patient is a 63 year old female who presents with a breast mass. The patient is a 64 year old white female who is a known patient of mine. She has recently recovered from a perforation of her duodenum that was managed nonoperatively. She is doing very well. She recently went for a routine screening mammogram at which time an abnormality was found in the lateral aspect of the left breast. This was biopsied and came back as atypical lobular hyperplasia. She also had an area identified in the right breast which was biopsied as well and it came back as fibrocystic disease. She denies any breast pain. She denies any discharge from her nipple. She does have an aunt who has had breast cancer. She does not take any hormone replacement.   Allergies Ivor Costa, [place]; 07/06/2014 9:17 AM) Erythromycin-Sulfisoxazole *ANTI-INFECTIVE AGENTS - MISC.* Morphine Sulfate *ANALGESICS - OPIOID*  Medication History Ivor Costa, [place]; 07/06/2014 9:17 AM) Prempro (0.3-1.5MG  Tablet, Oral) Active. Omeprazole (40MG  Capsule DR, Oral) Active. Enoxaparin Sodium (40MG /0.4ML Solution, Subcutaneous) Active.  Review of Systems Eddie Dibbles S. Marlou Starks MD; 07/09/2014 1:38 PM) General Not Present- Appetite Loss, Chills, Fatigue, Fever, Night Sweats, Weight Gain and Weight Loss. Skin Not Present- Change in Wart/Mole, Dryness, Hives, Jaundice, New Lesions, Non-Healing Wounds, Rash and Ulcer. HEENT Not Present- Earache, Hearing Loss, Hoarseness, Nose Bleed, Oral Ulcers, Ringing in the Ears, Seasonal Allergies, Sinus Pain, Sore Throat, Visual Disturbances, Wears glasses/contact lenses and Yellow Eyes. Respiratory Not Present- Bloody sputum, Chronic Cough,  Difficulty Breathing, Snoring and Wheezing. Breast Not Present- Breast Mass, Breast Pain, Nipple Discharge and Skin Changes. Cardiovascular Not Present- Chest Pain, Difficulty Breathing Lying Down, Leg Cramps, Palpitations, Rapid Heart Rate, Shortness of Breath and Swelling of Extremities. Gastrointestinal Not Present- Abdominal Pain, Bloating, Bloody Stool, Change in Bowel Habits, Chronic diarrhea, Constipation, Difficulty Swallowing, Excessive gas, Gets full quickly at meals, Hemorrhoids, Indigestion, Nausea, Rectal Pain and Vomiting. Female Genitourinary Not Present- Frequency, Nocturia, Painful Urination, Pelvic Pain and Urgency. Musculoskeletal Not Present- Back Pain, Joint Pain, Joint Stiffness, Muscle Pain, Muscle Weakness and Swelling of Extremities. Neurological Not Present- Decreased Memory, Fainting, Headaches, Numbness, Seizures, Tingling, Tremor, Trouble walking and Weakness. Psychiatric Not Present- Anxiety, Bipolar, Change in Sleep Pattern, Depression, Fearful and Frequent crying. Endocrine Not Present- Cold Intolerance, Excessive Hunger, Hair Changes, Heat Intolerance, Hot flashes and New Diabetes. Hematology Not Present- Easy Bruising, Excessive bleeding, Gland problems, HIV and Persistent Infections.   Vitals Ivor Costa MA; 07/06/2014 9:16 AM) 07/06/2014 9:16 AM Weight: 117.2 lb Height: 64in Body Surface Area: 1.55 m Body Mass Index: 20.12 kg/m Temp.: 97.95F(Temporal)  Pulse: 72 (Regular)  Resp.: 16 (Unlabored)  BP: 106/70 (Sitting, Left Arm, Standard)    Physical Exam Eddie Dibbles S. Marlou Starks MD; 07/09/2014 1:40 PM) General Mental Status-Alert. General Appearance-Consistent with stated age. Hydration-Well hydrated. Voice-Normal.  Head and Neck Head-normocephalic, atraumatic with no lesions or palpable masses. Trachea-midline. Thyroid Gland Characteristics - normal size and consistency.  Eye Eyeball - Bilateral-Extraocular movements  intact. Sclera/Conjunctiva - Bilateral-No scleral icterus.  Chest and Lung Exam Chest and lung exam reveals -quiet, even and easy respiratory effort with no use of accessory muscles and on auscultation, normal breath sounds, no adventitious sounds and normal vocal resonance. Inspection Chest Wall - Normal. Back - normal.  Breast Note:  There is no palpable mass in either breast. There is no palpable axillary, supraclavicular, or cervical lymphadenopathy. There is some palpable bruising in the lateral aspect of the left breast in the upper aspect of the right breast.   Cardiovascular Cardiovascular examination reveals -normal heart sounds, regular rate and rhythm with no murmurs and normal pedal pulses bilaterally.  Abdomen Inspection Inspection of the abdomen reveals - No Hernias. Skin - Scar - no surgical scars. Palpation/Percussion Palpation and Percussion of the abdomen reveal - Soft, Non Tender, No Rebound tenderness, No Rigidity (guarding) and No hepatosplenomegaly. Auscultation Auscultation of the abdomen reveals - Bowel sounds normal.  Neurologic Neurologic evaluation reveals -alert and oriented x 3 with no impairment of recent or remote memory. Mental Status-Normal.  Musculoskeletal Normal Exam - Left-Upper Extremity Strength Normal and Lower Extremity Strength Normal. Normal Exam - Right-Upper Extremity Strength Normal and Lower Extremity Strength Normal.  Lymphatic Head & Neck  General Head & Neck Lymphatics: Bilateral - Description - Normal. Axillary  General Axillary Region: Bilateral - Description - Normal. Tenderness - Non Tender. Femoral & Inguinal  Generalized Femoral & Inguinal Lymphatics: Bilateral - Description - Normal. Tenderness - Non Tender.    Assessment & Plan Eddie Dibbles S. Marlou Starks MD; 07/06/2014 10:05 AM) ATYPICAL LOBULAR HYPERPLASIA OF LEFT BREAST (610.8  N60.92) Impression: The patient appears to have a small area of atypical lobular  hyperplasia in the lateral left breast. Because this is a high risk lesion think it would be reasonable to remove this area. I would recommend a left breast radioactive seed localized lumpectomy. I have discussed with her in detail the risks and benefits of the operation to do this as well as some of the technical aspects and she understands and wishes to proceed     Signed by Luella Cook, MD (07/09/2014 1:41 PM)

## 2014-08-02 NOTE — Anesthesia Preprocedure Evaluation (Addendum)
Anesthesia Evaluation  Patient identified by MRN, date of birth, ID band Patient awake    Reviewed: Allergy & Precautions, NPO status , Patient's Chart, lab work & pertinent test results  Airway Mallampati: II   Neck ROM: full    Dental   Pulmonary neg pulmonary ROS,          Cardiovascular hypertension,     Neuro/Psych    GI/Hepatic GERD-  ,  Endo/Other    Renal/GU      Musculoskeletal   Abdominal   Peds  Hematology   Anesthesia Other Findings   Reproductive/Obstetrics                            Anesthesia Physical Anesthesia Plan  ASA: II  Anesthesia Plan: General   Post-op Pain Management:    Induction: Intravenous  Airway Management Planned: LMA  Additional Equipment:   Intra-op Plan:   Post-operative Plan:   Informed Consent: I have reviewed the patients History and Physical, chart, labs and discussed the procedure including the risks, benefits and alternatives for the proposed anesthesia with the patient or authorized representative who has indicated his/her understanding and acceptance.     Plan Discussed with: CRNA, Anesthesiologist and Surgeon  Anesthesia Plan Comments:         Anesthesia Quick Evaluation

## 2014-08-02 NOTE — Discharge Instructions (Signed)
°  Post Anesthesia Home Care Instructions  Activity: Get plenty of rest for the remainder of the day. A responsible adult should stay with you for 24 hours following the procedure.  For the next 24 hours, DO NOT: -Drive a car -Paediatric nurse -Drink alcoholic beverages -Take any medication unless instructed by your physician -Make any legal decisions or sign important papers.  Meals: Start with liquid foods such as gelatin or soup. Progress to regular foods as tolerated. Avoid greasy, spicy, heavy foods. If nausea and/or vomiting occur, drink only clear liquids until the nausea and/or vomiting subsides. Call your physician if vomiting continues.  Special Instructions/Symptoms: Your throat may feel dry or sore from the anesthesia or the breathing tube placed in your throat during surgery. If this causes discomfort, gargle with warm salt water. The discomfort should disappear within 24 hours.    Call your surgeon if you experience:   1.  Fever over 101.0. 2.  Inability to urinate. 3.  Nausea and/or vomiting. 4.  Extreme swelling or bruising at the surgical site. 5.  Continued bleeding from the incision. 6.  Increased pain, redness or drainage from the incision. 7.  Problems related to your pain medication. 8. Any change in color, movement and/or sensation 9. Any problems and/or concerns

## 2014-08-03 ENCOUNTER — Encounter (HOSPITAL_BASED_OUTPATIENT_CLINIC_OR_DEPARTMENT_OTHER): Payer: Self-pay | Admitting: General Surgery

## 2014-08-05 NOTE — Anesthesia Postprocedure Evaluation (Signed)
Anesthesia Post Note  Patient: Brianna Fuller  Procedure(s) Performed: Procedure(s) (LRB): BREAST LUMPECTOMY WITH RADIOACTIVE SEED LOCALIZATION (Left)  Anesthesia type: General  Patient location: PACU  Post pain: Pain level controlled and Adequate analgesia  Post assessment: Post-op Vital signs reviewed, Patient's Cardiovascular Status Stable, Respiratory Function Stable, Patent Airway and Pain level controlled  Last Vitals:  Filed Vitals:   08/02/14 1603  BP: 130/63  Pulse: 78  Temp: 36.4 C  Resp: 16    Post vital signs: Reviewed and stable  Level of consciousness: awake, alert  and oriented  Complications: No apparent anesthesia complications

## 2014-08-05 NOTE — Anesthesia Postprocedure Evaluation (Deleted)
Anesthesia Post Note  Patient: Brianna Fuller  Procedure(s) Performed: Procedure(s) (LRB): BREAST LUMPECTOMY WITH RADIOACTIVE SEED LOCALIZATION (Left)  Anesthesia type: General  Patient location: PACU  Post pain: Pain level controlled and Adequate analgesia  Post assessment: Post-op Vital signs reviewed, Patient's Cardiovascular Status Stable, Respiratory Function Stable, Patent Airway and Pain level controlled  Last Vitals:  Filed Vitals:   08/02/14 1603  BP: 130/63  Pulse: 78  Temp: 36.4 C  Resp: 16    Post vital signs: Reviewed and stable  Level of consciousness: awake, alert  and oriented  Complications: No apparent anesthesia complications

## 2014-08-19 ENCOUNTER — Telehealth: Payer: Self-pay | Admitting: *Deleted

## 2014-08-19 NOTE — Telephone Encounter (Signed)
Scheduled and confirmed appt for High Risk with Dr. Burr Medico on 09/02/14 at 3:00pm. Gave pt instructions and directions. Denies questions or concerns at this time. Encourage pt to call with needs. Received verbal understanding. Contact information given.

## 2014-08-19 NOTE — Telephone Encounter (Signed)
Received referral from [facility] for High Risk.  Called and left a message for the pt to return my call so I can schedule her.

## 2014-08-23 ENCOUNTER — Encounter: Payer: Self-pay | Admitting: *Deleted

## 2014-08-23 NOTE — Progress Notes (Signed)
Copied office note and placed a copy in Dr. Ernestina Penna box and took one to HIM to scan.  Add other records are in [place].

## 2014-08-26 ENCOUNTER — Telehealth: Payer: Self-pay | Admitting: Emergency Medicine

## 2014-08-26 DIAGNOSIS — Z0000 Encounter for general adult medical examination without abnormal findings: Secondary | ICD-10-CM

## 2014-08-26 NOTE — Telephone Encounter (Signed)
-----   Message from Megan Salon, MD sent at 08/25/2014 12:30 PM EDT ----- Regarding: RE: [facility] hold  Yes, out of [facility] hold.  Pt probably needs to be off HRT.  Can you call her and advise of this.  MSM ----- Message -----    From: Michele Mcalpine, RN    Sent: 08/25/2014   8:18 AM      To: Megan Salon, MD Subject: [facility] hold                                       Dr. Sabra Heck,  This patient is in [facility] hold until final pathology from lumpectomy with Dr. Marlou Starks. Patient had lumpectomy on 08/02/14. Okay to remove from hold?

## 2014-08-26 NOTE — Telephone Encounter (Signed)
Message left to return call to Mckinzy Fuller at [phone].    

## 2014-08-26 NOTE — Telephone Encounter (Addendum)
Spoke with patient. Advised of message as seen below from [place]. Patient is agreeable and verbalizes understanding. Patient prefers not to come off of HRT all at once. Advised patient okay to take Prempro every other day for two weeks and then come off all together. Patient is agreeable and verbalizes understanding. Will return call if she is symptomatic while coming off/stopping medication. Patient would like to move aex in July to see Dr.Miller. Annual scheduled fro 12/27/2014 at 1:30pm with Dr.Miller. Would like to have lab work done in advance to review at appointment. Lab appointment scheduled for 7/14 at 8:30am. Patient is agreeable to dates and times. Patient will need lab orders. At last annual patient had hemoglobin, vitamin D, and CBC checked.  Dr.Miller, okay for patient to continue as instructed above? Okay to repeat labs from last year this year for aex?

## 2014-08-26 NOTE — Telephone Encounter (Signed)
Agree with above except for labs.  Orders entered for cbc, cmp, tsh, vit d, and lipids.  Thanks.  Ok to close encounter.

## 2014-09-02 ENCOUNTER — Encounter: Payer: Self-pay | Admitting: Hematology

## 2014-09-02 ENCOUNTER — Ambulatory Visit (HOSPITAL_BASED_OUTPATIENT_CLINIC_OR_DEPARTMENT_OTHER): Payer: BLUE CROSS/BLUE SHIELD | Admitting: Hematology

## 2014-09-02 ENCOUNTER — Ambulatory Visit: Payer: BLUE CROSS/BLUE SHIELD

## 2014-09-02 ENCOUNTER — Telehealth: Payer: Self-pay | Admitting: Hematology

## 2014-09-02 VITALS — BP 112/54 | HR 67 | Temp 98.1°F | Resp 18 | Ht 64.0 in | Wt 117.9 lb

## 2014-09-02 DIAGNOSIS — N62 Hypertrophy of breast: Secondary | ICD-10-CM | POA: Diagnosis not present

## 2014-09-02 DIAGNOSIS — M899 Disorder of bone, unspecified: Secondary | ICD-10-CM

## 2014-09-02 DIAGNOSIS — N6092 Unspecified benign mammary dysplasia of left breast: Secondary | ICD-10-CM

## 2014-09-02 NOTE — Telephone Encounter (Signed)
Gave avs & calendar for April/August.

## 2014-09-02 NOTE — Progress Notes (Signed)
Checked in new pt with no financial concerns. °

## 2014-09-02 NOTE — Progress Notes (Signed)
[facility] CONSULT NOTE  Patient Care Team: Cari Caraway, MD as PCP - General (Family Medicine)  CHIEF COMPLAINTS/PURPOSE OF CONSULTATION:  Atypical lobular hyperplasia of left breast  HISTORY OF PRESENTING ILLNESS:  Brianna Fuller 64 y.o. female is here because of recent diagnosis of left breast atypical hyperplasia.  This was detected by screening mammogram, which was done on 06/15/2014. It showed heterogeneous calcifications in both breasts . She underwent bilateral breast core biopsy, which showed atypical hyperplasia in the left breast region, and fibrocystic change in the right breast lesion. She was referred to [place] general surgeon Dr. Marcello Moores and underwent left breast lumpectomy on 08/02/2014. She tolerated surgery well and has been recovering from the surgery. No significant pain at the surgical site, also still sensitive. No other symptoms. She is single, works as a Cabin crew, physically active.  In terms of breast cancer risk profile:  She menarched at early age of 77 and went to menopause at age 75  She had 0 pregnancy.   She had received birth control pills for approximately 30 years. She was on hormonal replacement since the menaupose, and is tapering off lately.  She was never exposed to fertility medications.  She has positive family history of Breast/GYN/GI cancer. Her paternal aunt had breast cancer at age of 57's.  MEDICAL HISTORY:  Past Medical History  Diagnosis Date  . Thyroid nodule 4/09    right  . Osteoporosis   . 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 Wears glasses     SURGICAL HISTORY: Past Surgical History  Procedure Laterality Date  . Combined hysteroscopy diagnostic / d&c  05/31/2008  . Dilation and curettage of uterus    . Upper gi endoscopy    . Colonoscopy    . Breast lumpectomy with radioactive seed localization Left 08/02/2014    Procedure: BREAST LUMPECTOMY WITH RADIOACTIVE SEED LOCALIZATION;  Surgeon: Autumn Messing III, MD;  Location: [facility];  Service: General;  Laterality: Left;    SOCIAL HISTORY: History   Social History  . Marital Status: Single    Spouse Name: N/A  . Number of Children: N/A  . Years of Education: N/A   Occupational History  . Not on file.   Social History Main Topics  . Smoking status: Never Smoker   . Smokeless tobacco: Never Used  . Alcohol Use: 3.0 - 3.6 oz/week    4-5 Standard drinks or equivalent, 1 Glasses of wine per week     Comment: [employer] wine every other day, and social drinker   . Drug Use: No  . Sexual Activity: No   Other Topics Concern  . Not on file   Social History Narrative    FAMILY HISTORY: Family History  Problem Relation Age of Onset  . Hypertension Father   . Cancer Brother     skin  . Osteoporosis Mother   . Hyperlipidemia Mother   . Cancer Paternal Aunt 22    breast cancer     ALLERGIES:  is allergic to morphine and related; penicillins; amoxicillin; erythromycin; and other.  MEDICATIONS:  Current Outpatient Prescriptions  Medication Sig Dispense Refill  . Calcium Carbonate-Vit D-Min (CALCIUM 1200 PO) Take by mouth.    . cholecalciferol (VITAMIN D) 1000 UNITS tablet Take 1,000 Units by mouth daily. 2000    . estrogen, conjugated,-medroxyprogesterone (PREMPRO) 0.45-1.5 MG per tablet Take 1 tablet by mouth daily.    . Lactobacillus (PROBIOTIC ACIDOPHILUS PO) Take by mouth.    . loratadine (CLARITIN)  10 MG tablet Take 10 mg by mouth daily.    Marland Kitchen omeprazole (PRILOSEC) 20 MG capsule Take 20 mg by mouth 2 (two) times daily before a meal.    . oxyCODONE-acetaminophen (ROXICET) 5-325 MG per tablet Take 1-2 tablets by mouth every 4 (four) hours as needed. 50 tablet 0  . vitamin C (ASCORBIC ACID) 500 MG tablet Take 500 mg by mouth daily.     No current facility-administered medications for this visit.    REVIEW OF SYSTEMS:   Constitutional: Denies fevers, chills or abnormal night sweats Eyes: Denies blurriness of vision, double vision or  watery eyes Ears, nose, mouth, throat, and face: Denies mucositis or sore throat Respiratory: Denies cough, dyspnea or wheezes Cardiovascular: Denies palpitation, chest discomfort or lower extremity swelling Gastrointestinal:  Denies nausea, heartburn or change in bowel habits Skin: Denies abnormal skin rashes Lymphatics: Denies new lymphadenopathy or easy bruising Neurological:Denies numbness, tingling or new weaknesses Behavioral/Psych: Mood is stable, no new changes  All other systems were reviewed with the patient and are negative.  PHYSICAL EXAMINATION: ECOG PERFORMANCE STATUS: 0 - Asymptomatic  Filed Vitals:   09/02/14 1537  BP: 112/54  Pulse: 67  Temp: 98.1 F (36.7 C)  Resp: 18   Filed Weights   09/02/14 1537  Weight: 117 lb 14.4 oz (53.479 kg)    GENERAL:alert, no distress and comfortable SKIN: skin color, texture, turgor are normal, no rashes or significant lesions EYES: normal, conjunctiva are pink and non-injected, sclera clear OROPHARYNX:no exudate, no erythema and lips, buccal mucosa, and tongue normal  NECK: supple, thyroid normal size, non-tender, without nodularity LYMPH:  no palpable lymphadenopathy in the cervical, axillary or inguinal LUNGS: clear to auscultation and percussion with normal breathing effort HEART: regular rate & rhythm and no murmurs and no lower extremity edema ABDOMEN:abdomen soft, non-tender and normal bowel sounds Musculoskeletal:no cyanosis of digits and no clubbing  PSYCH: alert & oriented x 3 with fluent speech NEURO: no focal motor/sensory deficits Breasts: Breast inspection showed them to be symmetrical with no nipple discharge. Palpation of the right breasts she was a 3-4 cm lump after the biopsy site with mild tenderness. No skin bruise. Palpitation of the left breast and axilla revealed no obvious mass that I could appreciate. The surgical scar in left breast appears clean and healing well.    LABORATORY DATA:  I have  reviewed the data as listed Lab Results  Component Value Date   WBC 5.9 02/15/2014   HGB 12.3 08/02/2014   HCT 31.3* 02/15/2014   MCV 94.3 02/15/2014   PLT 389 02/15/2014   Lab Results  Component Value Date   NA 138 02/15/2014   K 4.4 02/15/2014   CL 103 02/15/2014   CO2 25 02/15/2014   Pathology report Breast, lumpectomy, left 08/02/2014 - LOBULAR NEOPLASIA (ATYPICAL LOBULAR HYPERPLASIA), SEE COMMENT. - CALCIFICATIONS PRESENT. - PREVIOUS BIOPSY PRESENT. - SURGICAL MARGINS, NEGATIVE FOR ATYPIA OR MALIGNANCY.  RADIOGRAPHIC STUDIES: I have personally reviewed the radiological images as listed and agreed with the findings in the report. No results found.  ASSESSMENT AND PLAN:  64 year old postmenopausal Caucasian female  #1 left breast atypical lobular hyperplasia -I discussed her imaging findings and surgical pathology results with her in great details. -We reviewed the natural history of atypical hyperplasia. It is consider a benign breast disease, however it does increase the risk of breast cancer by 3-5 fold. It is considered as a high risk for breast cancer. -We discussed annual screening mammogram, which will detect  early stage breast cancer. She agrees to continue. She is very compliant on screening -We also discussed healthy diet and regular exercise, calcium and vitamin D supplement, to reduce her risk of breast cancer -She is currently on hormonal replacement, which can potentially increase the risk of breast cancer. I recommend her to stop. She is a currently tapering it off and agreed to stop. -We further discussed chemoprevention to breast cancer. I discussed the option of tamoxifen, raloxifene and anastrozole. These endocrine therapy agent is likely going to reduce the risk of breast cancer by 30-40%, however there is no data of survival benefit so far.  -The different side effects of this 3 agents were discussed with patient in great details. Written material were  provided to her.  -She is interested in chemoprevention, however would like to discuss with her gynecologist when she see her in July.   #2 Bone health Her last DEXA scan was one year ago, which showed osteopenia. -She is on vitamins D and calcium supplement -I discussed tamoxifen and raloxifene can increased her bone density.  Follow-up: I plan to see her back in August, if she decides to take chemoprevention agent. Otherwise she will follow-up with her primary care physician.  All questions were answered. The patient knows to call the clinic with any problems, questions or concerns. I spent 55 minutes counseling the patient face to face. The total time spent in the appointment was 60 minutes and more than 50% was on counseling.     Truitt Merle, MD 09/02/2014 4:21 PM

## 2014-09-17 ENCOUNTER — Ambulatory Visit: Payer: BLUE CROSS/BLUE SHIELD | Admitting: Nutrition

## 2014-09-17 NOTE — Progress Notes (Signed)
64 year old female diagnosed with atypical lobular hyperplasia of her left breast.  She is a patient of Dr.Feng.  Past medical history includes:   thyroid nodule, osteoporosis, and duodenal perforation.  Medications include:   vitamin D, calcium, lactobacillus, and vitamin C  Labs include albumin 3.1.  Height: 64 inches. Weight: 117.9 pounds March 24. Usual body weight: 130 pounds. BMI: 20.23.  Patient presents to nutrition consult requesting information on healthy diet to prevent breast cancer. Patient would like to maintain weight of 120 pounds. She is exercising about 300 minutes weekly.  Nutrition diagnosis: Food and nutrition related knowledge deficit related to hyperplasia as evidenced by no prior need for nutrition related information.  Intervention: Patient educated to consume primarily plant-based diet increasing total number fruits and vegetables to 7-9 servings daily. Encouraged patient to consume lean protein foods.  Including fatty fish twice weekly. Provided patient with information on high iron foods and methods to enhance absorption. Encouraged weight stability. Provided fact sheets.  Questions were answered.  Teach back method used.  Monitoring, evaluation, goals: Patient will tolerate healthy plant-based diet to reduce risk for breast cancer.  **Disclaimer: This note was dictated with voice recognition software. Similar sounding words can inadvertently be transcribed and this note may contain transcription errors which may not have been corrected upon publication of note.**   .

## 2014-10-15 ENCOUNTER — Telehealth: Payer: Self-pay | Admitting: Obstetrics & Gynecology

## 2014-10-15 NOTE — Telephone Encounter (Signed)
Pt says she went off Prempro as of last week and wanted Dr Sabra Heck to know.

## 2014-10-15 NOTE — Telephone Encounter (Signed)
Routing to [place] as [name].  Routing to provider for final review. Patient agreeable to disposition. Patient aware provider will review message and nurse will return call with any additional instructions or change of disposition. Will close encounter.

## 2014-11-25 ENCOUNTER — Telehealth: Payer: Self-pay | Admitting: Obstetrics & Gynecology

## 2014-11-25 NOTE — Telephone Encounter (Signed)
Patient has some medication questions for Dr.Miller's nurse. Last seen 12/13/13.

## 2014-11-25 NOTE — Telephone Encounter (Signed)
Spoke with patient. Patient states that she is in a new relationship and would like to become sexually active. Has concerns about STD's. "I have been tested before and have not been sexually active in a long time. I am not sure about his history." Advised patient if she has any concerns can have testing performed with our office and partner can have testing with PCP if needed. Advised to use a BUM if there are any STD concerns. Patient is agreeable. Would like to know about lubricants. Advised may use olive oil or coconut oil as needed for vaginal dryness. Patient is agreeable. Offered to schedule appointment with Dr.Miller regarding questions. Patient declines at this time and would like to speak with her partner first.  Routing to provider for final review. Patient agreeable to disposition. Will close encounter.   Patient aware provider will review message and nurse will return call if any additional advice or change of disposition.

## 2014-11-26 ENCOUNTER — Telehealth: Payer: Self-pay | Admitting: Obstetrics & Gynecology

## 2014-11-26 NOTE — Telephone Encounter (Signed)
Patient having pain during intercourse.

## 2014-11-26 NOTE — Telephone Encounter (Signed)
Spoke with patient. Patient states that she had intercourse for the first time in many years last night. Experienced a lot of discomfort and "interior irritation." Patient used coconut oil and [place] for lubrication. Patient is concerned. Denies any current pain or any bleeding. Advised will need to be seen in office for further evaluation. Patient is agreeable. Appointment scheduled for 6/23 at  11:15am with Dr.Miller. Patient is agreeable to date and time. Requesting to be placed on wait list for earlier appointment. Placed on wait list in EPIC.  Routing to provider for final review. Patient agreeable to disposition. Will close encounter.   Patient aware provider will review message and nurse will return call if any additional advice or change of disposition.

## 2014-12-02 ENCOUNTER — Encounter: Payer: Self-pay | Admitting: Obstetrics & Gynecology

## 2014-12-02 ENCOUNTER — Ambulatory Visit (INDEPENDENT_AMBULATORY_CARE_PROVIDER_SITE_OTHER): Payer: BLUE CROSS/BLUE SHIELD | Admitting: Obstetrics & Gynecology

## 2014-12-02 VITALS — BP 116/58 | HR 64 | Resp 16 | Wt 111.8 lb

## 2014-12-02 DIAGNOSIS — N941 Dyspareunia: Secondary | ICD-10-CM

## 2014-12-02 DIAGNOSIS — N6092 Unspecified benign mammary dysplasia of left breast: Secondary | ICD-10-CM

## 2014-12-02 DIAGNOSIS — N62 Hypertrophy of breast: Secondary | ICD-10-CM

## 2014-12-02 DIAGNOSIS — IMO0002 Reserved for concepts with insufficient information to code with codable children: Secondary | ICD-10-CM

## 2014-12-02 NOTE — Progress Notes (Signed)
Subjective:     Patient ID: Brianna Fuller, female   DOB: 1951/02/19, 64 y.o.   MRN: [id number]  HPI 64 yo G0 SWF here for discussion of pain with intercourse.  Pt in good relationship at this point and has attempted intercourse couple of times.  Used coconut oil and [place] for lubrication.  Has not had intercourse in about 10 years.  Feels that with penetration, there is a tight band present.  This caused pain and so they stopped.  Denies VB or vaginal discharge.  Pt here for some recommendations.    No dysuria.  H/O atypical ductal hyperplasia with lumpectomy 08/02/14 with Dr. Marlou Starks.  Has decided not to be on any adjuvant therapy.  Will do 3D MMG.  Did stop very low dosage HRT she was using and really hasn't noticed much change.   Review of Systems  All other systems reviewed and are negative.      Objective:   Physical Exam  Constitutional: She is oriented to person, place, and time. She appears well-developed and well-nourished.  Genitourinary: Vagina normal and uterus normal. There is no rash, tenderness, lesion or injury on the right labia. There is no rash, tenderness, lesion or injury on the left labia. Uterus is not enlarged, not fixed and not tender. Cervix exhibits no motion tenderness. Right adnexum displays no mass, no tenderness and no fullness. Left adnexum displays no mass and no fullness. No vaginal discharge found.  With insertion of two fingers, side by side, there is a band of tight tissue--hymenal ring.  Good length present.  Lymphadenopathy:       Right: No inguinal adenopathy present.       Left: No inguinal adenopathy present.  Neurological: She is alert and oriented to person, place, and time.  Skin: Skin is warm and dry.  Psychiatric: She has a normal mood and affect.       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  May need return office visit for additional instruction.) Prefer to not use estrogen if possible due to breast hx Possible small episiotomy discuss with pt.  She would like to try more conservative methods first.  Will return 8 weeks for discussion and additional recommendations.  Very grateful for frank discussion.  ~Total time with pt was about 30 minutes.  Most of this time was spent in face to face discussion of above issues.

## 2014-12-02 NOTE — Patient Instructions (Signed)
[email or web address]--order dilator set Coconut oil vaginally three times weekly [place], [place], or olive oil for lubrication

## 2014-12-05 ENCOUNTER — Encounter: Payer: Self-pay | Admitting: Obstetrics & Gynecology

## 2014-12-05 DIAGNOSIS — IMO0002 Reserved for concepts with insufficient information to code with codable children: Secondary | ICD-10-CM | POA: Insufficient documentation

## 2014-12-21 ENCOUNTER — Telehealth: Payer: Self-pay | Admitting: Obstetrics & Gynecology

## 2014-12-21 NOTE — Telephone Encounter (Signed)
Spoke with patient. Patient states that she has been using the dilators as recommended by Dr.Miller. Is currently on #6 which is the last on in the pack. States she has been having trouble adjusting to this one and intercourse is still painful as her partner is "larger than the #6 dilator." Patient is asking is she can start on Estrogen at this time. "I am just so frustrated. We can not have sex at all and I have been trying this method for a while to try to stretch the area." Has aex on Monday 7/18 at 1:30pm with Dr.Miller. Asking if she may start Estrogen to see how she does before this appointment so she may discuss this further with Dr.Miller at that appointment. Advised will speak with Dr.Miller regarding further recommendations and return call. Patient is agreeable.

## 2014-12-21 NOTE — Telephone Encounter (Signed)
Patient calling regarding her estrogen medication.

## 2014-12-22 MED ORDER — OSPEMIFENE 60 MG PO TABS: 1.0000 | ORAL_TABLET | Freq: Every day | ORAL | Status: DC

## 2014-12-22 NOTE — Telephone Encounter (Signed)
Spoke with patient. Advised of message as seen below from [place]. Patient is agreeable and verbalizes understanding. Follow up appointment scheduled for 01/28/2015 at 9:45am with Dr.Miller. Patient is agreeable to date and time. States this morning after using the #6 dilator she noticed some light spotting when she wiped. Placed a tampon to see if there was any further bleeding. Only noted a small spot of blood on the side of the tampon. Advised this is likely due to irritation/stretching from the dilator. Advised not to place anything per vagina for the rest of the day. Will need to monitor bleeding and if it continues or increases will need to give our office a call. Patient is agreeable. Advised if #6 is causing discomfort may want to use #5 again and work back up to #6. Patient is agreeable.  Dr.Miller anything further for this patient?

## 2014-12-22 NOTE — Telephone Encounter (Signed)
I do not think she should be on estrogen due to breast hx but she could start osphena 60mg  daily.  I will place order.  She may need follow up with me in three to four weeks after starting the osphena.  Thanks.

## 2014-12-23 ENCOUNTER — Ambulatory Visit: Payer: BC Managed Care – PPO | Admitting: Certified Nurse Midwife

## 2014-12-23 ENCOUNTER — Other Ambulatory Visit (INDEPENDENT_AMBULATORY_CARE_PROVIDER_SITE_OTHER): Payer: BLUE CROSS/BLUE SHIELD

## 2014-12-23 ENCOUNTER — Other Ambulatory Visit: Payer: Self-pay | Admitting: Obstetrics & Gynecology

## 2014-12-23 DIAGNOSIS — Z0000 Encounter for general adult medical examination without abnormal findings: Secondary | ICD-10-CM

## 2014-12-23 LAB — CBC
HCT: 38.8 % (ref 36.0–46.0)
Hemoglobin: 12.3 g/dL (ref 12.0–15.0)
MCH: 29.4 pg (ref 26.0–34.0)
MCHC: 31.7 g/dL (ref 30.0–36.0)
MCV: 92.6 fL (ref 78.0–100.0)
MPV: 10.5 fL (ref 8.6–12.4)
Platelets: 219 10*3/uL (ref 150–400)
RBC: 4.19 MIL/uL (ref 3.87–5.11)
RDW: 13.5 % (ref 11.5–15.5)
WBC: 6.7 10*3/uL (ref 4.0–10.5)

## 2014-12-23 NOTE — Telephone Encounter (Signed)
No.  Thank you. OK to close encounter.

## 2014-12-24 LAB — LIPID PANEL
CHOL/HDL RATIO: 2.4 ratio
Cholesterol: 161 mg/dL (ref 0–200)
HDL: 66 mg/dL (ref >=46)
LDL Cholesterol: 89 mg/dL (ref 0–99)
Triglycerides: 32 mg/dL (ref <150)
VLDL: 6 mg/dL (ref 0–40)

## 2014-12-24 LAB — TSH: TSH: 2.167 u[IU]/mL (ref 0.350–4.500)

## 2014-12-24 LAB — COMPREHENSIVE METABOLIC PANEL
ALT: 22 U/L (ref 0–35)
AST: 22 U/L (ref 0–37)
Albumin: 4.4 g/dL (ref 3.5–5.2)
Alkaline Phosphatase: 65 U/L (ref 39–117)
BUN: 14 mg/dL (ref 6–23)
CHLORIDE: 102 meq/L (ref 96–112)
CO2: 30 mEq/L (ref 19–32)
Calcium: 9.7 mg/dL (ref 8.4–10.5)
Creat: 0.61 mg/dL (ref 0.50–1.10)
Glucose, Bld: 87 mg/dL (ref 70–99)
Potassium: 4.6 mEq/L (ref 3.5–5.3)
Sodium: 142 mEq/L (ref 135–145)
TOTAL PROTEIN: 6.6 g/dL (ref 6.0–8.3)
Total Bilirubin: 0.5 mg/dL (ref 0.2–1.2)

## 2014-12-24 LAB — VITAMIN D 25 HYDROXY (VIT D DEFICIENCY, FRACTURES): Vit D, 25-Hydroxy: 46 ng/mL (ref 30–100)

## 2014-12-27 ENCOUNTER — Encounter: Payer: Self-pay | Admitting: Obstetrics & Gynecology

## 2014-12-27 ENCOUNTER — Ambulatory Visit (INDEPENDENT_AMBULATORY_CARE_PROVIDER_SITE_OTHER): Payer: BLUE CROSS/BLUE SHIELD | Admitting: Obstetrics & Gynecology

## 2014-12-27 VITALS — BP 98/60 | HR 72 | Resp 16 | Ht 63.75 in | Wt 112.0 lb

## 2014-12-27 DIAGNOSIS — Z01419 Encounter for gynecological examination (general) (routine) without abnormal findings: Secondary | ICD-10-CM

## 2014-12-27 LAB — VITAMIN B12: Vitamin B-12: 249 pg/mL (ref 211–911)

## 2014-12-27 LAB — FOLATE: Folate: 11.1 ng/mL

## 2014-12-27 NOTE — Progress Notes (Signed)
64 y.o. [name] here for annual exam.  No vaginal bleeding.  Pt has been able to use the #6 dilator.  They have been able to have intercourse twice.  Pt is very motivated and pleased with success, thus far.  Is using Osphena.   Patient's last menstrual period was 06/12/2007.          Sexually active: Yes.    The current method of family planning is post menopausal status.    Exercising: Yes.    Weights, cardio, yoga Smoker:  No.  Health Maintenance: Pap:  12/17/13 Neg History of abnormal Pap:  no MMG:  06/21/14 BIRADS4:Suspicious. Atypical lobular hyperplasia of left breast. 08/02/14 Lumpectomy - Left--follow up recommended in 1 year Colonoscopy:  02/2012 - repeat 10 years  BMD:   12/2013 Osteoporosis  TDaP:  2013 Screening Labs: , Hg: 12.4 -  12/22/13, Urine today:    reports that she has never smoked. She has never used smokeless tobacco. She reports that she drinks about 6.0 - 7.8 oz of alcohol per week. She reports that she does not use illicit drugs.  Past Medical History  Diagnosis Date  . Thyroid nodule 4/09    right  . Osteoporosis   . 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 Wears glasses   . Atypical lobular hyperplasia of left breast   . Vaginal atrophy     Past Surgical History  Procedure Laterality Date  . Combined hysteroscopy diagnostic / d&c  05/31/2008  . Dilation and curettage of uterus    . Upper gi endoscopy    . Colonoscopy    . Breast lumpectomy with radioactive seed localization Left 08/02/2014    Procedure: BREAST LUMPECTOMY WITH RADIOACTIVE SEED LOCALIZATION;  Surgeon: Autumn Messing III, MD;  Location: [facility];  Service: General;  Laterality: Left;    Current Outpatient Prescriptions  Medication Sig Dispense Refill  . Calcium Carbonate-Vit D-Min (CALCIUM 1200 PO) Take by mouth.    . cholecalciferol (VITAMIN D) 1000 UNITS tablet Take 2,000 Units by mouth daily.     . Lactobacillus (PROBIOTIC ACIDOPHILUS PO) Take by mouth.    . loratadine  (CLARITIN) 10 MG tablet Take 10 mg by mouth daily.    Marland Kitchen omeprazole (PRILOSEC) 20 MG capsule Take 20 mg by mouth as directed. Take 40 mg daily for a month; then 20mg  daily .    Marland Kitchen Ospemifene (OSPHENA) 60 MG TABS Take 1 tablet by mouth daily. 30 tablet 2  . vitamin C (ASCORBIC ACID) 500 MG tablet Take 500 mg by mouth daily.     No current facility-administered medications for this visit.    Family History  Problem Relation Age of Onset  . Hypertension Father   . Cancer Brother     skin  . Osteoporosis Mother   . Hyperlipidemia Mother   . Cancer Paternal Aunt 41    breast cancer     ROS:  Pertinent items are noted in HPI.  Otherwise, a comprehensive ROS was negative.  Exam:   BP 98/60 mmHg  Pulse 72  Resp 16  Ht 5' 3.75" (1.619 m)  Wt 112 lb (50.803 kg)  BMI 19.38 kg/m2  LMP 06/12/2007   Height: 5' 3.75" (161.9 cm)  Ht Readings from Last 3 Encounters:  12/27/14 5' 3.75" (1.619 m)  09/02/14 5\' 4"  (1.626 m)  07/28/14 5\' 4"  (1.626 m)    General appearance: alert, cooperative and appears stated age Head: Normocephalic, without obvious abnormality, atraumatic Neck: no adenopathy, supple, symmetrical,  trachea midline and thyroid normal to inspection and palpation Lungs: clear to auscultation bilaterally Breasts: Right breast cyst, about 3cm in size, mobile, normal left breast with well healed incision Heart: regular rate and rhythm Abdomen: soft, non-tender; bowel sounds normal; no masses,  no organomegaly Extremities: extremities normal, atraumatic, no cyanosis or edema Skin: Skin color, texture, turgor normal. No rashes or lesions Lymph nodes: Cervical, supraclavicular, and axillary nodes normal. No abnormal inguinal nodes palpated Neurologic: Grossly normal   Pelvic: External genitalia:  no lesions except for small skin tear from SA              Urethra:  normal appearing urethra with no masses, tenderness or lesions              Bartholins and Skenes: normal                  Vagina: normal appearing vagina with normal color and discharge, no lesions              Cervix: no lesions              Pap taken: No. Bimanual Exam:  Uterus:  normal size, contour, position, consistency, mobility, non-tender              Adnexa: normal adnexa and no mass, fullness, tenderness               Rectovaginal: Confirms               Anus:  normal sphincter tone, no lesions  Chaperone was present for exam.  A:  Well Woman with normal exam Off HRT H/O atypical lobar hyperplasia, s/p lumpectomy 2/16.  Follow up recommended one year.  Seeing Dr. Marlou Starks in September for breast recheck.  Has breast cyst that may be an area of hematoma from her prior breast biopsy.  Again, Dr. Marlou Starks following. Dyspareunia and atrophic changes--using dilators with success H/O thyroid nodule with negative biopsy Osteoporosis H/o duodenal perforation Concerns about baby powder use--normal CT 11/15  P: MMG yearly On osphena.  Pt using dilators. Add B12 and folate (Dr. Marlou Starks recommended testing) Pap normal 2015, neg Pap with neg HR HPV 2013.  No pap today. Has follow scheduled one month for dyspareunia4 Follow up 1 year or prn

## 2014-12-30 ENCOUNTER — Telehealth: Payer: Self-pay

## 2014-12-30 NOTE — Telephone Encounter (Signed)
-----   Message from Megan Salon, MD sent at 12/28/2014 10:53 AM EDT ----- Please inform pt these are both normal.

## 2014-12-30 NOTE — Telephone Encounter (Signed)
Lmtcb//kn 

## 2015-01-03 NOTE — Telephone Encounter (Signed)
Patient notified of all results.//kn 

## 2015-01-21 ENCOUNTER — Telehealth: Payer: Self-pay | Admitting: Hematology

## 2015-01-21 NOTE — Telephone Encounter (Signed)
Due to [facility] schedule change 8/17 appointment moved to 8/24. Left message for patient and mailed schedule.

## 2015-01-26 ENCOUNTER — Ambulatory Visit: Payer: BLUE CROSS/BLUE SHIELD | Admitting: Hematology

## 2015-01-26 ENCOUNTER — Telehealth: Payer: Self-pay | Admitting: Hematology

## 2015-01-26 NOTE — Telephone Encounter (Signed)
pt cld to CX appt stated dediced against preventative trmt at this time-did not want to r/s

## 2015-01-28 ENCOUNTER — Ambulatory Visit (INDEPENDENT_AMBULATORY_CARE_PROVIDER_SITE_OTHER): Payer: BLUE CROSS/BLUE SHIELD | Admitting: Obstetrics & Gynecology

## 2015-01-28 ENCOUNTER — Encounter: Payer: Self-pay | Admitting: Obstetrics & Gynecology

## 2015-01-28 VITALS — BP 110/68 | HR 68 | Resp 13 | Wt 111.0 lb

## 2015-01-28 DIAGNOSIS — IMO0002 Reserved for concepts with insufficient information to code with codable children: Secondary | ICD-10-CM

## 2015-01-28 DIAGNOSIS — B373 Candidiasis of vulva and vagina: Secondary | ICD-10-CM | POA: Diagnosis not present

## 2015-01-28 DIAGNOSIS — Z202 Contact with and (suspected) exposure to infections with a predominantly sexual mode of transmission: Secondary | ICD-10-CM

## 2015-01-28 DIAGNOSIS — B3731 Acute candidiasis of vulva and vagina: Secondary | ICD-10-CM

## 2015-01-28 DIAGNOSIS — N941 Dyspareunia: Secondary | ICD-10-CM

## 2015-01-28 MED ORDER — FLUCONAZOLE 150 MG PO TABS: 150.0000 mg | ORAL_TABLET | Freq: Once | ORAL | Status: DC

## 2015-01-28 MED ORDER — OSPEMIFENE 60 MG PO TABS: 1.0000 | ORAL_TABLET | Freq: Every day | ORAL | Status: DC

## 2015-01-28 NOTE — Progress Notes (Signed)
Subjective:     Patient ID: Brianna Fuller, female   DOB: [birth year], 64 y.o.   MRN: [id number]  HPI 64 yo G0 SWF here for follow up of dyspareunia, vaginal dryness, and to discussed current vaginal dilator use.  Pt has been able to be successfully sexually active.  She is on Osphena.  Today we discussed, at length, as we have in the past, the date available about Osphena.    Pt reports in the early part of July, she had an episode of itching.  She didn't really have much in the way of discharge.  Denies having odor.  Pt reports she was using the olive oil and coconut oil faithfully.  She stopped using this as much and felt like she was staying "moist" all the time.    Also reports significant other was treated for yeast last week.  He saw his PCP for this.    Pt would like to have STD testing, just to be sure.    Review of Systems  All other systems reviewed and are negative.      Objective:   Physical Exam  Constitutional: She is oriented to person, place, and time. She appears well-developed and well-nourished.  Genitourinary: There is no rash, tenderness, lesion or injury on the right labia. There is no rash, tenderness, lesion or injury on the left labia. Vaginal discharge (scant, whitish discharge) found.  Lymphadenopathy:       Right: No inguinal adenopathy present.       Left: No inguinal adenopathy present.  Neurological: She is alert and oriented to person, place, and time.  Skin: Skin is warm and dry.  Psychiatric: She has a normal mood and affect.       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

## 2015-01-29 LAB — STD PANEL
HIV 1&2 Ab, 4th Generation: NONREACTIVE
Hepatitis B Surface Ag: NEGATIVE

## 2015-01-31 LAB — HSV(HERPES SMPLX)ABS-I+II(IGG+IGM)-BLD
HSV 1 GLYCOPROTEIN G AB, IGG: 6.52 IV — AB
Herpes Simplex Vrs I&II-IgM Ab (EIA): 0.78 INDEX

## 2015-02-01 LAB — IPS N GONORRHOEA AND CHLAMYDIA BY PCR

## 2015-02-02 ENCOUNTER — Ambulatory Visit: Payer: BLUE CROSS/BLUE SHIELD | Admitting: Hematology

## 2015-02-07 ENCOUNTER — Telehealth: Payer: Self-pay

## 2015-02-07 NOTE — Telephone Encounter (Signed)
-----   Message from Megan Salon, MD sent at 02/03/2015  1:35 PM EDT ----- Inform GC/Chl negative.  Other STD testing came back already and I called her personally about those.  Thanks.

## 2015-02-07 NOTE — Telephone Encounter (Signed)
Lmtcb//kn 

## 2015-02-08 ENCOUNTER — Telehealth: Payer: Self-pay | Admitting: Hematology

## 2015-02-08 NOTE — Telephone Encounter (Signed)
received Brianna Fuller's returned schedule in the mail asking that her appointments on 02/02/15 be cxd. Per her note she has decided not to move forward with and cancer treatment (prevention at this time). Copy of the note dropped ff to YF today.

## 2015-02-21 NOTE — Telephone Encounter (Signed)
Patient notified of all results.//kn 

## 2015-04-05 ENCOUNTER — Other Ambulatory Visit: Payer: Self-pay | Admitting: Obstetrics & Gynecology

## 2015-06-02 IMAGING — CT CT ABD-PELV W/ CM
1 of 3 series · 14 of 32 positions shown, 19 images · IV contrast (READICAT/WATER & [ID] OMNI 300)
Comparison: 02/05/2014

CLINICAL DATA: Followup perforated duodenal ulcer. Followup
abdominal abscess status post percutaneous catheter drainage.

BUN and creatinine were obtained on site at [HOSPITAL] at
[HOSPITAL].Results: BUN 22 mg/dL, Creatinine 0.6 mg/dL.
EXAM:
CT ABDOMEN AND PELVIS WITH CONTRAST
TECHNIQUE: Multidetector CT imaging of the abdomen and pelvis was performed
using the standard protocol following bolus administration of
intravenous contrast.
CONTRAST:  100mL OMNIPAQUE IOHEXOL 300 MG/ML  SOLN

[Series 2: abd/pelvis with · axial · 0.65mm/px · z∈[-388,-38]mm · 14 of 80 slices shown, 19 images]
[im 5/80  soft-tissue]
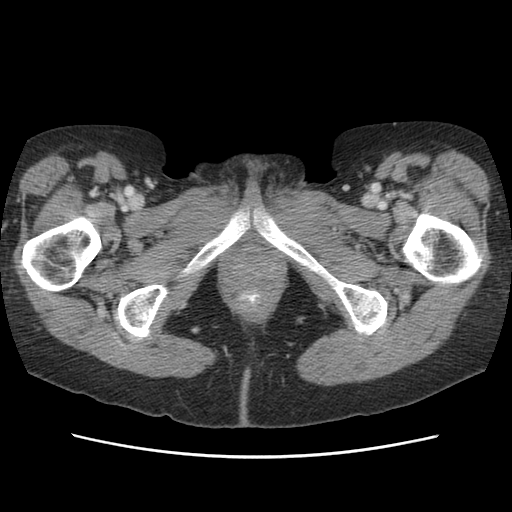
[im 5/80  bone]
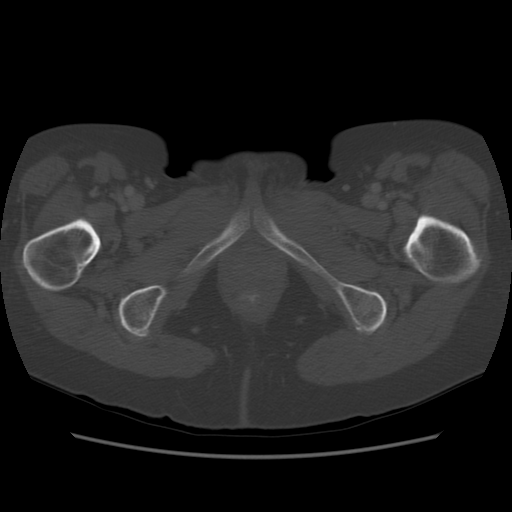
[im 10/80  soft-tissue]
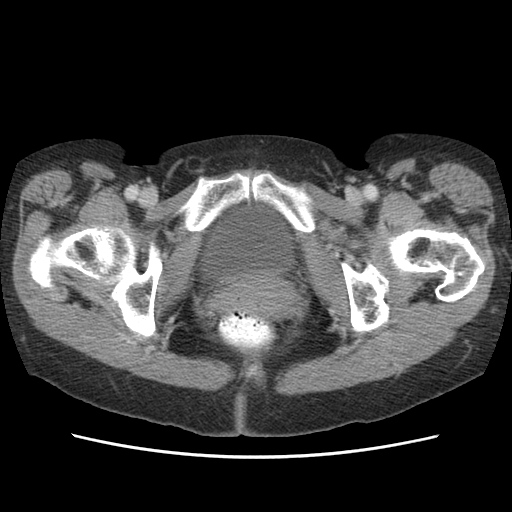
[im 19/80  soft-tissue]
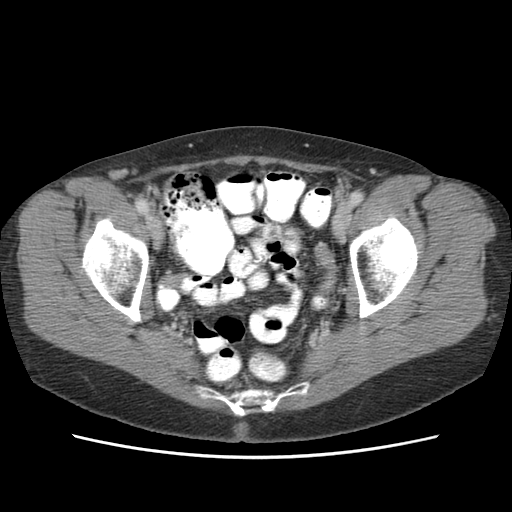
[im 24/80  soft-tissue]
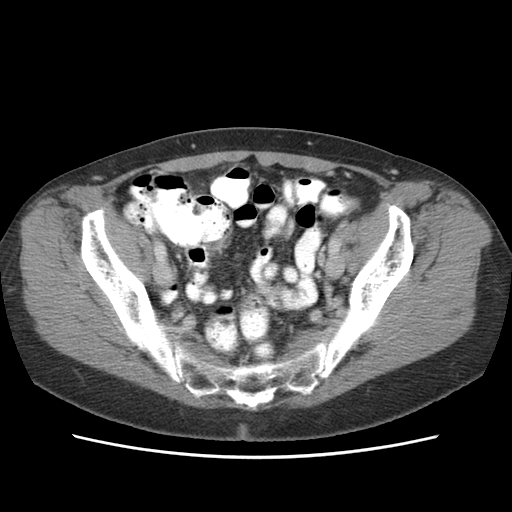
[im 28/80  soft-tissue]
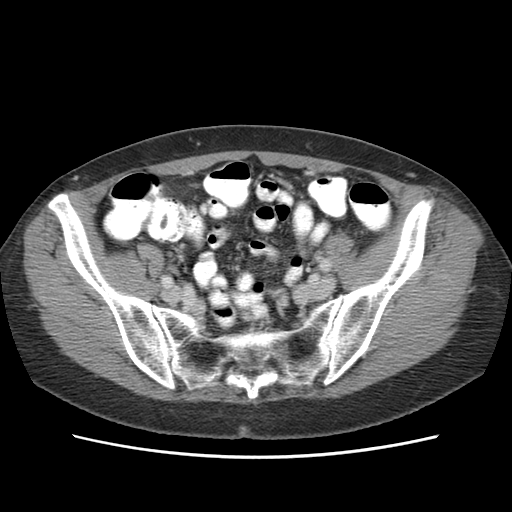
[im 33/80  soft-tissue]
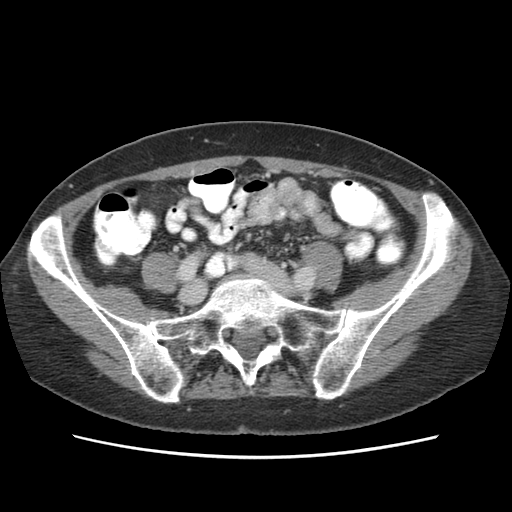
[im 42/80  soft-tissue]
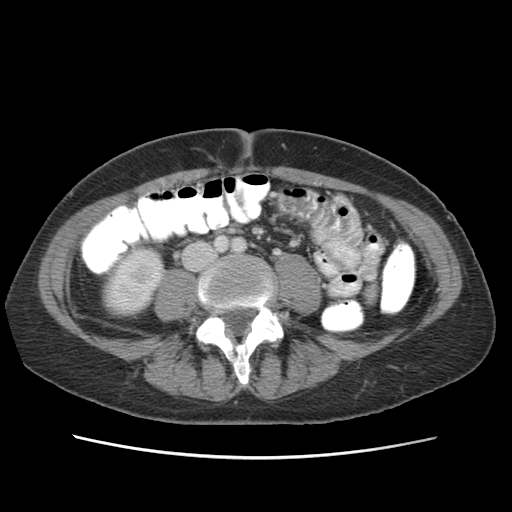
[im 47/80  soft-tissue]
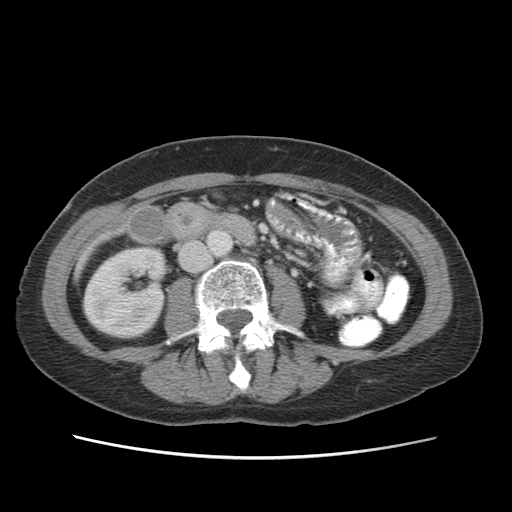
[im 52/80  soft-tissue]
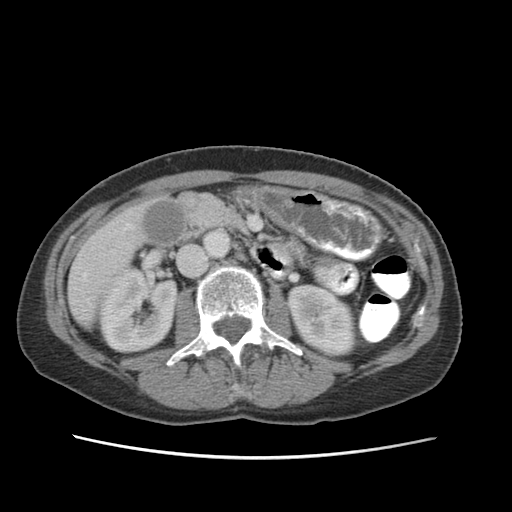
[im 52/80  bone]
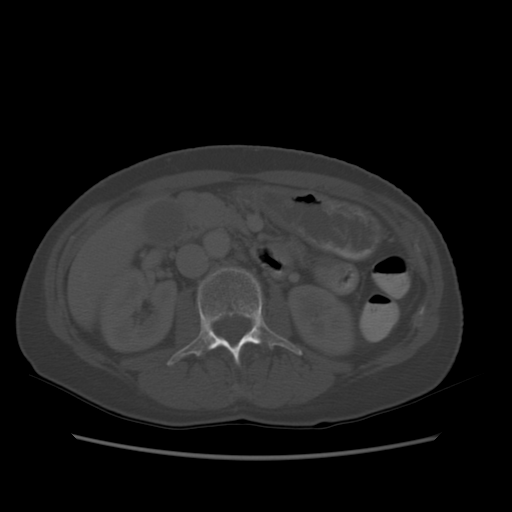
[im 56/80  soft-tissue]
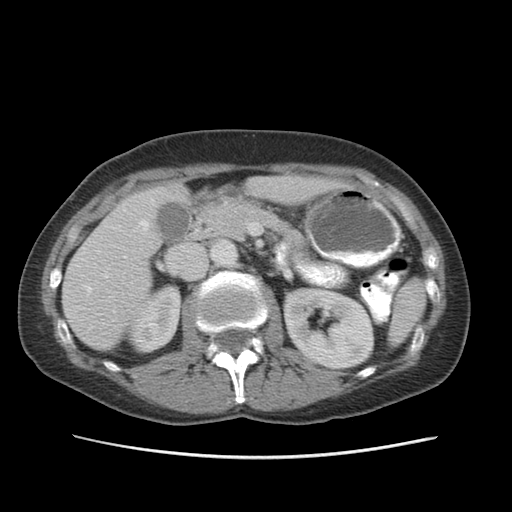
[im 61/80  soft-tissue]
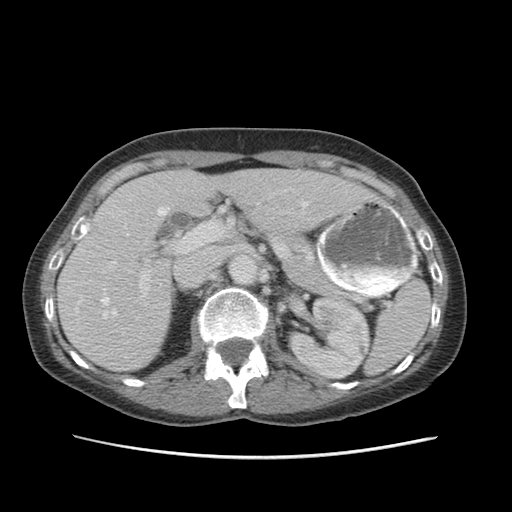
[im 61/80  lung]
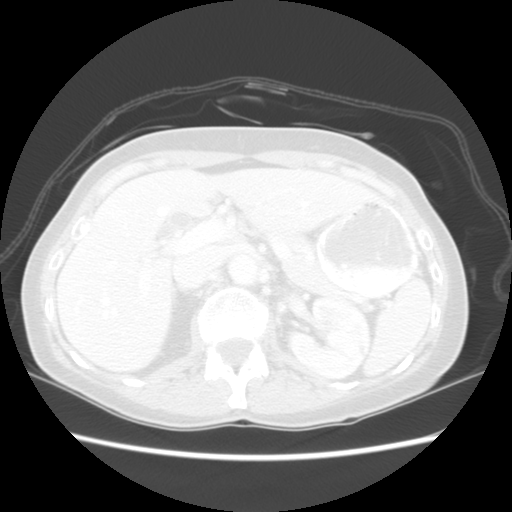
[im 66/80  lung]
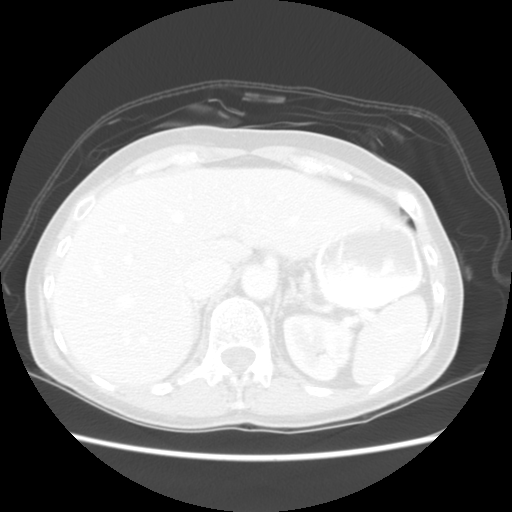
[im 70/80  soft-tissue]
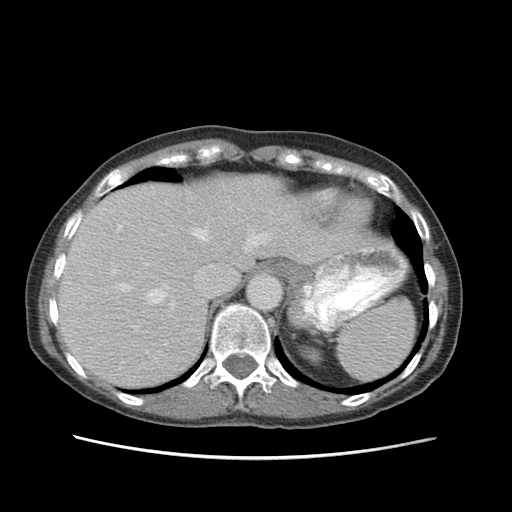
[im 70/80  lung]
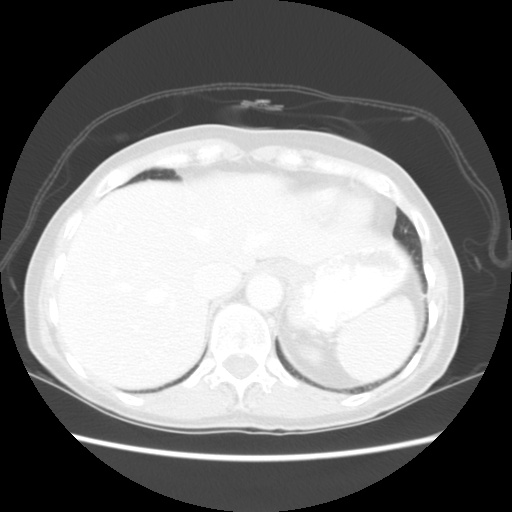
[im 75/80  soft-tissue]
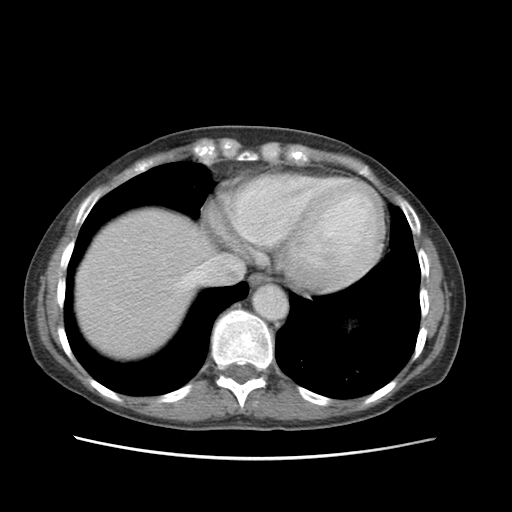
[im 75/80  lung]
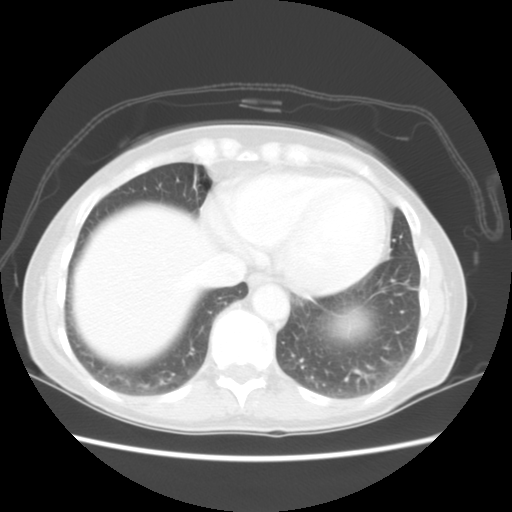

[14 of 32 positions shown; findings below may reference images not displayed]

FINDINGS: Lower Chest:  Unremarkable.

Hepatobiliary: No masses or other significant abnormality
identified. Gallbladder is unremarkable.

Pancreas: No mass, inflammatory changes, or other parenchymal
abnormality identified.

Spleen:  Within normal limits in size and appearance.

Adrenal Glands:  No mass identified.

Kidneys/Urinary Tract: No masses identified. No evidence of
hydronephrosis.

Stomach/Bowel/Peritoneum: No evidence of wall thickening, mass, or
obstruction. Previously seen periduodenal abscess is no longer
visualized. No acute inflammatory process or other abnormal fluid
collections identified.

Vascular/Lymphatic: No pathologically enlarged lymph nodes
identified. No other significant abnormality identified.

Reproductive: Small fibroid in the right lateral uterine wall
measuring approximately 2.2 cm is stable. Adnexal regions are
unremarkable.

Other:  None.

Musculoskeletal:  No suspicious bone lesions identified.
IMPRESSION: Interval resolution of periduodenal abscess since prior study. No
acute findings.

Stable 2 cm uterine fibroid.

## 2015-08-24 ENCOUNTER — Other Ambulatory Visit: Payer: Self-pay | Admitting: Radiology

## 2016-01-03 ENCOUNTER — Encounter: Payer: Self-pay | Admitting: Obstetrics & Gynecology

## 2016-01-03 ENCOUNTER — Ambulatory Visit (INDEPENDENT_AMBULATORY_CARE_PROVIDER_SITE_OTHER): Payer: BLUE CROSS/BLUE SHIELD | Admitting: Obstetrics & Gynecology

## 2016-01-03 VITALS — BP 104/70 | HR 74 | Ht 63.5 in | Wt 120.0 lb

## 2016-01-03 DIAGNOSIS — Z01419 Encounter for gynecological examination (general) (routine) without abnormal findings: Secondary | ICD-10-CM | POA: Diagnosis not present

## 2016-01-03 DIAGNOSIS — Z124 Encounter for screening for malignant neoplasm of cervix: Secondary | ICD-10-CM | POA: Diagnosis not present

## 2016-01-03 DIAGNOSIS — B977 Papillomavirus as the cause of diseases classified elsewhere: Secondary | ICD-10-CM

## 2016-01-03 DIAGNOSIS — Z205 Contact with and (suspected) exposure to viral hepatitis: Secondary | ICD-10-CM | POA: Diagnosis not present

## 2016-01-03 NOTE — Progress Notes (Signed)
65 y.o. G0P0000 Single CaucasianF here for annual exam.  Doing well.  No vaginal bleeding.  Able to be SA with the [place].  H/O lobular hyperplasia with negative margins.  Saw Dr. Burr Medico.  Has chosen not to be on any therapy.    Patient's last menstrual period was 06/12/2007.          Sexually active: Yes.    The current method of family planning is post menopausal status.    Exercising: Yes.    Gym, weights, yoga Smoker:  no  Health Maintenance: Pap:  12/17/13 Neg  History of abnormal Pap:  no MMG: Atypical lobular hyperplasia of left breast. 08/02/14 Lumpectomy. 08/24/15 Right Breast Bx: Fibrocystic Changes and Calcifications  Colonoscopy:  02/2012 normal - f/u 10 years  BMD:   12/21/13 Osteoporosis  TDaP:  2013  Pneumonia vaccine(s):  No Zostavax:   2012  Hep C testing: No Screening Labs: PCP, Hb today: PCP, Urine today: PCP   reports that she has never smoked. She has never used smokeless tobacco. She reports that she drinks about 6.0 - 7.8 oz of alcohol per week . She reports that she does not use drugs.  Past Medical History:  Diagnosis Date  . Atypical lobular hyperplasia of left breast   . Duodenal perforation ([place]) 8/15  . Osteoporosis   . Thyroid nodule 4/09   right  . Vaginal atrophy   . Wears glasses     Past Surgical History:  Procedure Laterality Date  . BREAST LUMPECTOMY WITH RADIOACTIVE SEED LOCALIZATION Left 08/02/2014   Procedure: BREAST LUMPECTOMY WITH RADIOACTIVE SEED LOCALIZATION;  Surgeon: Autumn Messing III, MD;  Location: [facility];  Service: General;  Laterality: Left;  . COLONOSCOPY    . COMBINED HYSTEROSCOPY DIAGNOSTIC / D&C  05/31/2008  . DILATION AND CURETTAGE OF UTERUS    . UPPER GI ENDOSCOPY      Current Outpatient Prescriptions  Medication Sig Dispense Refill  . Calcium Carbonate-Vit D-Min (CALCIUM 1200 PO) Take by mouth.    . cholecalciferol (VITAMIN D) 1000 UNITS tablet Take 2,000 Units by mouth daily.     . Lactobacillus  (PROBIOTIC ACIDOPHILUS PO) Take by mouth.    . loratadine (CLARITIN) 10 MG tablet Take 10 mg by mouth daily.    . Multiple Vitamin (MULTIVITAMIN) tablet Take 1 tablet by mouth daily.    Marland Kitchen omeprazole (PRILOSEC) 20 MG capsule Take 20 mg by mouth as directed. Take 40 mg daily for a month; then 20mg  daily .    Marland Kitchen Ospemifene (OSPHENA) 60 MG TABS Take 1 tablet by mouth daily. 30 tablet 10  . vitamin B-12 (CYANOCOBALAMIN) 100 MCG tablet Take 100 mcg by mouth 3 (three) times a week.    . vitamin C (ASCORBIC ACID) 500 MG tablet Take 500 mg by mouth daily.     No current facility-administered medications for this visit.     Family History  Problem Relation Age of Onset  . Hypertension Father   . Cancer Brother     skin  . Osteoporosis Mother   . Hyperlipidemia Mother   . Cancer Paternal Aunt 8    breast cancer     ROS:  Pertinent items are noted in HPI.  Otherwise, a comprehensive ROS was negative.  Exam:   BP 104/70 (BP Location: Right Arm, Patient Position: Sitting, Cuff Size: Normal)   Pulse 74   Ht 5' 3.5" (1.613 m)   Wt 120 lb (54.4 kg)   LMP 06/12/2007  BMI 20.92 kg/m   Height: 5' 3.5" (161.3 cm)  Ht Readings from Last 3 Encounters:  01/03/16 5' 3.5" (1.613 m)  12/27/14 5' 3.75" (1.619 m)  09/02/14 5\' 4"  (1.626 m)    General appearance: alert, cooperative and appears stated age Head: Normocephalic, without obvious abnormality, atraumatic Neck: no adenopathy, supple, symmetrical, trachea midline and thyroid normal to inspection and palpation Lungs: clear to auscultation bilaterally Breasts: normal appearance, no masses or tenderness Heart: regular rate and rhythm Abdomen: soft, non-tender; bowel sounds normal; no masses,  no organomegaly Extremities: extremities normal, atraumatic, no cyanosis or edema Skin: Skin color, texture, turgor normal. No rashes or lesions Lymph nodes: Cervical, supraclavicular, and axillary nodes normal. No abnormal inguinal nodes  palpated Neurologic: Grossly normal   Pelvic: External genitalia:  no lesions              Urethra:  normal appearing urethra with no masses, tenderness or lesions              Bartholins and Skenes: normal                 Vagina: normal appearing vagina with normal color and discharge, no lesions              Cervix: no lesions              Pap taken: Yes.   Bimanual Exam:  Uterus:  normal size, contour, position, consistency, mobility, non-tender              Adnexa: normal adnexa and no mass, fullness, tenderness               Rectovaginal: Confirms               Anus:  normal sphincter tone, no lesions  Chaperone was present for exam.  A:  Well Woman with normal exam H/O atypical lobular hyperplasia Osteoporosis manages with HRT, exercise, Vitamin D and calcium.  Continues to decline medications. H/o atrophic changes, much improved                       H/O duodenal perforation 8/15 Mildly low B12, followed by Dr. Addison Lank  P:        MMG screening discussed Pt is going to stop the [place] and see what happens.  Is aware long term risks for breast cancer are Recommended Hep C testing Labs with PCP Follow up 1 year or prn

## 2016-01-04 LAB — HEPATITIS C ANTIBODY: HCV Ab: NEGATIVE

## 2016-01-06 LAB — IPS PAP TEST WITH HPV

## 2016-01-06 NOTE — Addendum Note (Signed)
Addended by: Megan Salon on: 01/06/2016 12:31 PM   Modules accepted: Orders

## 2016-01-09 LAB — IPS HPV GENOTYPING 16/18

## 2016-01-16 ENCOUNTER — Telehealth: Payer: Self-pay | Admitting: Emergency Medicine

## 2016-01-16 ENCOUNTER — Telehealth: Payer: Self-pay | Admitting: Obstetrics & Gynecology

## 2016-01-16 DIAGNOSIS — R87810 Cervical high risk human papillomavirus (HPV) DNA test positive: Secondary | ICD-10-CM

## 2016-01-16 NOTE — Telephone Encounter (Signed)
Called patient to review benefits for a scheduled procedure. Patient states she is unable to talk, as she is at work. Patient to call back after 1:30 today and ask for Becky/

## 2016-01-16 NOTE — Telephone Encounter (Signed)
-----   Message from Megan Salon, MD sent at 01/09/2016  4:05 PM EDT ----- Please let pt know that her pap was normal but HR HPV was positive.  I also tested 16/18/45 and the 18/45 testing was positive.  Pt needs colposcopy.

## 2016-01-16 NOTE — Telephone Encounter (Signed)
Patient notified of message from Dr. Sabra Heck.  She is agreeable to scheduling colposcopy. Discussed HPV and normal pap results.  Brief description of procedure given to patient.  Colposcopy pre-procedure instructions given.   Make sure to eat a meal and hydrate before appointment.  Advised 800 mg of Motrin PO with food one hour prior to appointment.    Patient is advised she will be contacted with insurance coverage information.       cc Lerry Liner. Colposcopy is scheduled at this time for 01/26/16.   Routing to provider for final review. Patient agreeable to disposition. Will close encounter.

## 2016-01-26 ENCOUNTER — Ambulatory Visit (INDEPENDENT_AMBULATORY_CARE_PROVIDER_SITE_OTHER): Payer: BLUE CROSS/BLUE SHIELD | Admitting: Obstetrics & Gynecology

## 2016-01-26 VITALS — BP 100/80 | HR 80 | Resp 16 | Ht 63.5 in | Wt 119.0 lb

## 2016-01-26 DIAGNOSIS — R87810 Cervical high risk human papillomavirus (HPV) DNA test positive: Secondary | ICD-10-CM

## 2016-01-26 DIAGNOSIS — Z79899 Other long term (current) drug therapy: Secondary | ICD-10-CM

## 2016-01-26 NOTE — Patient Instructions (Signed)

## 2016-01-26 NOTE — Progress Notes (Signed)
65 y.o. Single [place] female here for colposcopy with possible biopsies and/or ECC due to neg Pap but new + HR HPV testing obtained 01/03/16.    Pt very anxious about prior baby powder with talc use.  Has really made herself anxious about this.  She wants to discuss risks for ovarian cancer.  We have discussed this previoiusly.  Desires "some sort of test" to help reassure herself.  D/w pt doing PUS.  She would very much like this.  Prior evaluation/treatment:  none.  Patient's last menstrual period was 06/12/2007.          Sexually active: Yes.    The current method of family planning is post menopausal status.     Patient has been counseled about results and procedure.  Risks and benefits have bene reviewed including immediate and/or delayed bleeding, infection, cervical scaring from procedure, possibility of needing additional follow up as well as treatment.  rare risks of missing a lesion discussed as well.  All questions answered.  Pt ready to proceed.  BP 100/80 (BP Location: Right Arm, Patient Position: Sitting, Cuff Size: Normal)   Pulse 80   Resp 16   Ht 5' 3.5" (1.613 m)   Wt 119 lb (54 kg)   LMP 06/12/2007   BMI 20.75 kg/m   Physical Exam  Constitutional: She is oriented to person, place, and time. She appears well-developed and well-nourished.  Genitourinary: Vagina normal and uterus normal.    Neurological: She is alert and oriented to person, place, and time.  Psychiatric: She has a normal mood and affect.    Speculum placed.  3% acetic acid applied to cervix for >45 seconds.  Cervix visualized with both 7.5X and 15X magnification.  Green filter also used.  Lugols solution was not used.  Findings:  HPV changes 6-8 o'clock.  Biopsy:  7 o'clock.  ECC:  was performed.  Monsel's was needed.  Excellent hemostasis was present.  Pt tolerated procedure well and all instruments were removed.  Findings noted above on picture of cervix.  Assessment:  +HR HPV, normal pap  smear  Plan:  Pathology results will be called to patient and follow-up planned pending results.

## 2016-01-30 ENCOUNTER — Telehealth: Payer: Self-pay | Admitting: Obstetrics & Gynecology

## 2016-01-30 NOTE — Telephone Encounter (Signed)
Spoke with patient. Advised of message as seen below from Dr.Miller. Patient is agreeable and verbalizes understanding.  Routing to provider for final review. Patient agreeable to disposition. Will close encounter   

## 2016-01-30 NOTE — Telephone Encounter (Signed)
Patient returned call and left a message during lunch to call back.

## 2016-01-30 NOTE — Telephone Encounter (Signed)
Patient is scheduled for PUS on 02/02/2016 with Dr.Miller. Patient is sexually active. G0P0000. Reports she is reviewing instructions for PUS and would like to know if she needs to drink water prior to her exam. Patient has a history of dilator use for intercourse. Routing to [place] for review and advise as we typically do not have sexually active patients drink water prior to exam, but due to patient history want to verify.

## 2016-01-30 NOTE — Telephone Encounter (Signed)
No.  Let her know I am only doing a transvaginal, not transabdominal and transvaginal.  So, we will have her empty her bladder before the exam.  She can consume water/beverages as is normal for her.  Thanks.

## 2016-01-30 NOTE — Telephone Encounter (Signed)
Reviewed benefit information. Patient understood and agreeable.

## 2016-01-30 NOTE — Telephone Encounter (Signed)
Patient requesting information regarding ultrasound instructions included in her last appointment summary.  States instructed to drink 32 oz water and arrive with full bladder to appointment.   She would like to confirm if that is the correct instruction for her specific appointment.  Agreeable to return call from nurse.

## 2016-02-02 ENCOUNTER — Ambulatory Visit (INDEPENDENT_AMBULATORY_CARE_PROVIDER_SITE_OTHER): Payer: BLUE CROSS/BLUE SHIELD | Admitting: Obstetrics & Gynecology

## 2016-02-02 ENCOUNTER — Encounter: Payer: Self-pay | Admitting: Obstetrics & Gynecology

## 2016-02-02 ENCOUNTER — Ambulatory Visit (INDEPENDENT_AMBULATORY_CARE_PROVIDER_SITE_OTHER): Payer: BLUE CROSS/BLUE SHIELD

## 2016-02-02 DIAGNOSIS — Z79899 Other long term (current) drug therapy: Secondary | ICD-10-CM

## 2016-02-02 DIAGNOSIS — D251 Intramural leiomyoma of uterus: Secondary | ICD-10-CM | POA: Diagnosis not present

## 2016-02-02 NOTE — Patient Instructions (Signed)
Folic acid.  Try and take 1 mg.  This is likely 3-482mcg tablets.

## 2016-02-02 NOTE — Progress Notes (Signed)
65 y.o. G0P0000 Single Caucasian female here for pelvic ultrasound due to concerns about long term baby power with talc use.  Pt has know hx of uterine fibroids.  Denies vaginal bleeding.  Pathology from recent colposcopy reviewed with pt.  Pt aware needs repeat Pap and HR HPV 1 year.  All questions answered regarding this.  Patient's last menstrual period was 06/12/2007.  Contraception: PMP  Findings:  UTERUS: 6.0 x 4.3 x 2.5cm with several small fibroids 2.4cm, 1.8cm, and 0.8cm EMS:  2.88mm.  Echogenic endometrium at fundus which could be encroaching fibroid vs polyp ADNEXA: Left ovary:  1.1 x 0.5 x 0.7cm       Right ovary: 1.8 x 1.2 x 0.6cm CUL DE SAC: no free fluid  Discussion:  Findings reviewed with pt.  Normal appearing ovaries noted.  Possible endometrial polyp vs encroaching fibroid.  Pt is not having bleeding and this is a small, solitary finding so this is ok to monitor.  Pt knows to call with any bleeding.  H/O polyp resection in the past.  Pt reassured about ovarian findings and is comfortable with plan.  Assessment:  High risk talc powder use, normal ovaries today H/o endometrial polyp with possible polyp or encroaching fibroid today Uterine fibroids.  CT 2015 showed largest fibroid to measure 2.2cm  Plan:  Repeat PUS 1 year  ~20 minutes spent with patient >50% of time was in face to face discussion of above.

## 2016-02-03 DIAGNOSIS — D259 Leiomyoma of uterus, unspecified: Secondary | ICD-10-CM | POA: Insufficient documentation

## 2016-02-10 LAB — IPS OTHER TISSUE BIOPSY

## 2016-02-21 ENCOUNTER — Telehealth: Payer: Self-pay

## 2016-02-21 DIAGNOSIS — M85852 Other specified disorders of bone density and structure, left thigh: Secondary | ICD-10-CM | POA: Diagnosis not present

## 2016-02-21 DIAGNOSIS — R921 Mammographic calcification found on diagnostic imaging of breast: Secondary | ICD-10-CM | POA: Diagnosis not present

## 2016-02-21 DIAGNOSIS — M810 Age-related osteoporosis without current pathological fracture: Secondary | ICD-10-CM | POA: Diagnosis not present

## 2016-02-21 DIAGNOSIS — R922 Inconclusive mammogram: Secondary | ICD-10-CM | POA: Diagnosis not present

## 2016-02-21 NOTE — Telephone Encounter (Signed)
Spoke with [facility] [employer] [facility] [employer]. Patient is at their facility now for mammogram and is due for her bone density. [facility] is requesting to preform this at her visit today. Verbal order given for bone density at this time. Paper order given to Dr. Sabra Heck to sign and fax.

## 2016-02-29 ENCOUNTER — Telehealth: Payer: Self-pay | Admitting: *Deleted

## 2016-02-29 NOTE — Telephone Encounter (Signed)
Message left to return call to Brianna Fuller at [phone] to review BMD results.  

## 2016-02-29 NOTE — Telephone Encounter (Signed)
Patient returned call. Notified of BMD results and verbalized understanding. Consult scheduled for Thursday 03/08/16 at 1445. Patient agreeable to date and time and aware to arrive 15 minutes early. BMD report to front for scan.    Routing to provider for final review. Patient agreeable to disposition. Will close encounter.

## 2016-03-01 ENCOUNTER — Telehealth: Payer: Self-pay | Admitting: Obstetrics & Gynecology

## 2016-03-01 NOTE — Telephone Encounter (Signed)
Spoke with patient. Patient called to reschedule BMD consult. Patient states she is going out of town 03/08/16 and only available 03/06/16. Patient rescheduled 03/06/16 at 4pm with Dr. Sabra Heck. Advised patient to arrive 15 min prior to appt. Patient is agreeable to date and time.   Routing to provider for final review. Patient is agreeable to disposition. Will close encounter.

## 2016-03-01 NOTE — Telephone Encounter (Signed)
Patient would like to speak with nurse about rescheduling her consult appointment with Dr Sabra Heck.

## 2016-03-06 ENCOUNTER — Encounter: Payer: Self-pay | Admitting: Obstetrics & Gynecology

## 2016-03-06 ENCOUNTER — Ambulatory Visit (INDEPENDENT_AMBULATORY_CARE_PROVIDER_SITE_OTHER): Payer: Medicare Other | Admitting: Obstetrics & Gynecology

## 2016-03-06 VITALS — BP 90/60 | HR 62 | Resp 14 | Ht 64.0 in | Wt 120.2 lb

## 2016-03-06 DIAGNOSIS — M810 Age-related osteoporosis without current pathological fracture: Secondary | ICD-10-CM

## 2016-03-06 NOTE — Progress Notes (Signed)
Subjective:    62 yrs Single Caucasian G0P0000  female here to discuss recent BMD obtained 02/21/16 showing osteoporosis in her spine with measurement -3.3.  This was 13% decrease from prior BMD done in 2015.  Hips, bilaterally, were stable.    Reviewed findings with pt.  Discussed with pt inconsistency in report.  Do not feel I want to start treatment at this time.  Has had recent Vit D, Calcium, CMP.  Reviewed supplements she is taking.  Is taking >3000 IU Vit D.  Recommended decreasing this to around 2000 IU.     Osteoporosis Risk Factors  Nonmodifiable Personal Hx of fracture as an adult: no Hx of fracture in first-degree relative: yes - mother (h/o osteoporosis) Caucasian race: yes Advanced age: no Female sex: yes Dementia: no Poor health/frailty: no  Potentially modifiable: Tobacco use: no Low body weight (<127 lbs): no Estrogen deficiency  early menopause (age <45) or bilateral ovariectomy: no  prolonged premenopausal amenorrhea (>1 yr): no Low calcium intake (lifelong): no Alcohol use more than 2 drinks per day: no Recurrent falls: no Inadequate physical activity: no  Current calcium and Vit D intake:  Calcium 615mg  daily and 3150 IU Vit D  Review of Systems Pertinent items noted in HPI and remainder of comprehensive ROS otherwise negative.     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  Patient counseled in adequate calcium and vitamin D exposure.  Reviewed her supplements.  She will decrease her  Vit D to 2000 IU daily. 2.  Pt is exercising regularly and with a trainer.  she will discuss recent findings with him to increase weight bearing exercise on spine.  3.  Recommendation to avoid heavy ETOH use.  4.  PTH with intact calcium obtained today.  Will consider 24 urine for total calcium if this is normal. 5.  Pt not desirous of medical treatment.  Will plan to repeat BMD 1-2 years.  She would be willing to pay out of pocket if no coverage in one year for BMD.  ~40 minutes spent with patient.  All of this in face to face discussion of above.  Pt with many questions.  All were addressed.

## 2016-03-07 DIAGNOSIS — M810 Age-related osteoporosis without current pathological fracture: Secondary | ICD-10-CM | POA: Diagnosis not present

## 2016-03-08 ENCOUNTER — Institutional Professional Consult (permissible substitution): Payer: Self-pay | Admitting: Obstetrics & Gynecology

## 2016-03-08 LAB — PTH, INTACT AND CALCIUM
Calcium: 9.5 mg/dL (ref 8.6–10.4)
PTH: 27 pg/mL (ref 14–64)

## 2016-03-14 ENCOUNTER — Telehealth: Payer: Self-pay | Admitting: *Deleted

## 2016-03-14 NOTE — Telephone Encounter (Signed)
Left detailed message on [phone] per current dpr on file. Advised of results and recommendations as seen below per Dr. Sabra Heck. Advised patient to call office for instructions on collecting 24hr urine and obtaining collection container.

## 2016-03-14 NOTE — Addendum Note (Signed)
Addended by: Megan Salon on: 03/14/2016 05:17 AM   Modules accepted: Orders

## 2016-03-14 NOTE — Telephone Encounter (Signed)
-----   Message from Megan Salon, MD sent at 03/14/2016  5:15 AM EDT ----- Please let pt know her PTH and calcium levels were normal.  I'd like her to do a 24 hour urine collection for calcium.  The first void of the day can be flushed and then she collects everything for the rest of the day and the first urine of the morning.  Order has been placed.

## 2016-03-19 ENCOUNTER — Ambulatory Visit: Payer: Medicare Other

## 2016-03-19 ENCOUNTER — Other Ambulatory Visit (INDEPENDENT_AMBULATORY_CARE_PROVIDER_SITE_OTHER): Payer: Medicare Other

## 2016-03-19 DIAGNOSIS — Z0000 Encounter for general adult medical examination without abnormal findings: Secondary | ICD-10-CM

## 2016-03-19 DIAGNOSIS — M810 Age-related osteoporosis without current pathological fracture: Secondary | ICD-10-CM | POA: Diagnosis not present

## 2016-03-20 LAB — CALCIUM, URINE, 24 HOUR
CALCIUM 24HR UR: 198 mg/(24.h) (ref 35–250)
Calcium, Ur: 9 mg/dL

## 2016-03-27 ENCOUNTER — Ambulatory Visit: Payer: BLUE CROSS/BLUE SHIELD | Admitting: Obstetrics & Gynecology

## 2016-03-30 ENCOUNTER — Encounter: Payer: Self-pay | Admitting: Nurse Practitioner

## 2016-03-30 ENCOUNTER — Telehealth: Payer: Self-pay | Admitting: Obstetrics & Gynecology

## 2016-03-30 ENCOUNTER — Ambulatory Visit (INDEPENDENT_AMBULATORY_CARE_PROVIDER_SITE_OTHER): Payer: Medicare Other | Admitting: Nurse Practitioner

## 2016-03-30 VITALS — BP 122/80 | HR 72 | Ht 64.0 in | Wt 119.0 lb

## 2016-03-30 DIAGNOSIS — L720 Epidermal cyst: Secondary | ICD-10-CM | POA: Diagnosis not present

## 2016-03-30 MED ORDER — SULFAMETHOXAZOLE-TRIMETHOPRIM 800-160 MG PO TABS: 1.0000 | ORAL_TABLET | Freq: Two times a day (BID) | ORAL | 0 refills | Status: DC

## 2016-03-30 NOTE — Telephone Encounter (Signed)
Spoke with patient. Patient states that 2 weeks ago she noticed 2 bumps on her inner left thigh. Bumps are around 1 inch apart. Over the last 24 hours 1 bump has begun to turn red and appears to have "white colored fluid in it." Reports the other does not appear to be fluid filled and is skin colored. Bumps have become tender to the touch. Advised patient she will need to be seen in the office for further evaluation. Patient is agreeable. Appointment scheduled for today 03/30/2016 at 3 pm with Kem Boroughs, FNP. Patient is agreeable to date and time and to see another provider. Aware if Kem Boroughs, FNP needs Dr.Miller to take a look at the area she is in the office this afternoon. Patient is agreeable.  Cc: Dr.Miller  Routing to Kem Boroughs, FNP for review. Patient agreeable to disposition. Will close encounter.

## 2016-03-30 NOTE — Progress Notes (Signed)
65 y.o. Single Caucasian female G0P0000 here with complaints of a bump along inner aspect of left thigh.  She noted 2 small bumps and now is larger and is more tender.  She has a history of HPV and went on line and thinks this is genital HPV warts.   Same partner and he has no symptoms. Denies new personal products or vaginal dryness.    No STD concerns. Urinary symptoms none . Contraception is postmenopausal.   O:  Healthy female WDWN Affect: normal, orientation x 3  Exam: no distress Abdomen: no pain Lymph node: no enlargement or tenderness Pelvic exam: External genital: normal female BUS: negative; there is a lesion inner aspect of left upper thigh that could represent a bite - 1 smaller area without exudate but has a dome appearance like molluscum.  The larger has induration of about 1 cm with  Pinpoint head that is opened and wound culture is obtained.  Not a lot of exudate. Vagina: no  discharge noted.    A: Wound or cyst upper inner thigh   P: Discussed findings of the lesion and discussed possible staph, strep or MRSA. She is given Septra DS BID # 14 and will call with culture results    RV prn

## 2016-03-30 NOTE — Patient Instructions (Signed)
Epidermal Cyst An epidermal cyst is sometimes called a sebaceous cyst, epidermal inclusion cyst, or infundibular cyst. These cysts usually contain a substance that looks "pasty" or "cheesy" and may have a bad smell. This substance is a protein called keratin. Epidermal cysts are usually found on the face, neck, or trunk. They may also occur in the vaginal area or other parts of the genitalia of both men and women. Epidermal cysts are usually small, painless, slow-growing bumps or lumps that move freely under the skin. It is important not to try to pop them. This may cause an infection and lead to tenderness and swelling. CAUSES  Epidermal cysts may be caused by a deep penetrating injury to the skin or a plugged hair follicle, often associated with acne. SYMPTOMS  Epidermal cysts can become inflamed and cause:  Redness.  Tenderness.  Increased temperature of the skin over the bumps or lumps.  Grayish-white, bad smelling material that drains from the bump or lump. DIAGNOSIS  Epidermal cysts are easily diagnosed by your caregiver during an exam. Rarely, a tissue sample (biopsy) may be taken to rule out other conditions that may resemble epidermal cysts. TREATMENT   Epidermal cysts often get better and disappear on their own. They are rarely ever cancerous.  If a cyst becomes infected, it may become inflamed and tender. This may require opening and draining the cyst. Treatment with antibiotics may be necessary. When the infection is gone, the cyst may be removed with minor surgery.  Small, inflamed cysts can often be treated with antibiotics or by injecting steroid medicines.  Sometimes, epidermal cysts become large and bothersome. If this happens, surgical removal in your caregiver's office may be necessary. HOME CARE INSTRUCTIONS  Only take over-the-counter or prescription medicines as directed by your caregiver.  Take your antibiotics as directed. Finish them even if you start to feel  better. SEEK MEDICAL CARE IF:   Your cyst becomes tender, red, or swollen.  Your condition is not improving or is getting worse.  You have any other questions or concerns. MAKE SURE YOU:  Understand these instructions.  Will watch your condition.  Will get help right away if you are not doing well or get worse.   This information is not intended to replace advice given to you by your health care provider. Make sure you discuss any questions you have with your health care provider.   Document Released: 04/28/2004 Document Revised: 08/20/2011 Document Reviewed: 12/04/2010 Elsevier Interactive Patient Education 2016 Elsevier Inc.  

## 2016-03-30 NOTE — Telephone Encounter (Signed)
Left message to call Cadince Hilscher at [phone]. 

## 2016-03-30 NOTE — Telephone Encounter (Signed)
Patient is returning a call to [name]. °

## 2016-03-30 NOTE — Telephone Encounter (Signed)
Patient left voice mail at lunch, wants to speak to a nurse.  Says she thinks she has 2 HPV warts on her upper thigh near her vagina.  Said one of them is getting red and an "infected" look.

## 2016-04-01 NOTE — Progress Notes (Signed)
Encounter reviewed by Dr. Brook Amundson C. Silva.  

## 2016-04-02 LAB — WOUND CULTURE
GRAM STAIN: NONE SEEN
Gram Stain: NONE SEEN
Organism ID, Bacteria: NORMAL

## 2016-04-04 DIAGNOSIS — Z23 Encounter for immunization: Secondary | ICD-10-CM | POA: Diagnosis not present

## 2016-05-08 DIAGNOSIS — H25813 Combined forms of age-related cataract, bilateral: Secondary | ICD-10-CM | POA: Diagnosis not present

## 2016-05-08 DIAGNOSIS — H43393 Other vitreous opacities, bilateral: Secondary | ICD-10-CM | POA: Diagnosis not present

## 2016-06-21 DIAGNOSIS — K265 Chronic or unspecified duodenal ulcer with perforation: Secondary | ICD-10-CM | POA: Diagnosis not present

## 2016-06-21 DIAGNOSIS — M810 Age-related osteoporosis without current pathological fracture: Secondary | ICD-10-CM | POA: Diagnosis not present

## 2016-06-21 DIAGNOSIS — Z79899 Other long term (current) drug therapy: Secondary | ICD-10-CM | POA: Diagnosis not present

## 2016-06-21 DIAGNOSIS — R7301 Impaired fasting glucose: Secondary | ICD-10-CM | POA: Diagnosis not present

## 2016-06-21 DIAGNOSIS — Z0001 Encounter for general adult medical examination with abnormal findings: Secondary | ICD-10-CM | POA: Diagnosis not present

## 2016-06-21 DIAGNOSIS — E041 Nontoxic single thyroid nodule: Secondary | ICD-10-CM | POA: Diagnosis not present

## 2016-08-28 DIAGNOSIS — R922 Inconclusive mammogram: Secondary | ICD-10-CM | POA: Diagnosis not present

## 2016-09-05 DIAGNOSIS — D2361 Other benign neoplasm of skin of right upper limb, including shoulder: Secondary | ICD-10-CM | POA: Diagnosis not present

## 2016-09-05 DIAGNOSIS — D2272 Melanocytic nevi of left lower limb, including hip: Secondary | ICD-10-CM | POA: Diagnosis not present

## 2016-09-05 DIAGNOSIS — B081 Molluscum contagiosum: Secondary | ICD-10-CM | POA: Diagnosis not present

## 2016-09-05 DIAGNOSIS — D225 Melanocytic nevi of trunk: Secondary | ICD-10-CM | POA: Diagnosis not present

## 2016-09-05 DIAGNOSIS — D2239 Melanocytic nevi of other parts of face: Secondary | ICD-10-CM | POA: Diagnosis not present

## 2016-09-05 DIAGNOSIS — L853 Xerosis cutis: Secondary | ICD-10-CM | POA: Diagnosis not present

## 2016-09-05 DIAGNOSIS — D1801 Hemangioma of skin and subcutaneous tissue: Secondary | ICD-10-CM | POA: Diagnosis not present

## 2016-09-17 ENCOUNTER — Telehealth: Payer: Self-pay | Admitting: *Deleted

## 2016-09-17 NOTE — Telephone Encounter (Signed)
Patient in 04 recall for bilateral DX mmg. Please contact patient regarding scheduling  Thanks

## 2016-09-17 NOTE — Telephone Encounter (Signed)
Patient had Dx Bilateral Mammogram performed on 08/28/16 at [facility]. Report has been faxed.

## 2016-09-27 DIAGNOSIS — M5410 Radiculopathy, site unspecified: Secondary | ICD-10-CM | POA: Diagnosis not present

## 2016-09-27 DIAGNOSIS — Z23 Encounter for immunization: Secondary | ICD-10-CM | POA: Diagnosis not present

## 2016-09-27 DIAGNOSIS — Z8719 Personal history of other diseases of the digestive system: Secondary | ICD-10-CM | POA: Diagnosis not present

## 2016-09-27 DIAGNOSIS — Z0000 Encounter for general adult medical examination without abnormal findings: Secondary | ICD-10-CM | POA: Diagnosis not present

## 2016-09-27 DIAGNOSIS — H9193 Unspecified hearing loss, bilateral: Secondary | ICD-10-CM | POA: Diagnosis not present

## 2016-09-27 DIAGNOSIS — M810 Age-related osteoporosis without current pathological fracture: Secondary | ICD-10-CM | POA: Diagnosis not present

## 2016-09-27 DIAGNOSIS — Z131 Encounter for screening for diabetes mellitus: Secondary | ICD-10-CM | POA: Diagnosis not present

## 2016-09-27 DIAGNOSIS — R7301 Impaired fasting glucose: Secondary | ICD-10-CM | POA: Diagnosis not present

## 2016-09-27 DIAGNOSIS — Z5181 Encounter for therapeutic drug level monitoring: Secondary | ICD-10-CM | POA: Diagnosis not present

## 2016-09-27 DIAGNOSIS — Z13220 Encounter for screening for lipoid disorders: Secondary | ICD-10-CM | POA: Diagnosis not present

## 2016-09-27 DIAGNOSIS — R5383 Other fatigue: Secondary | ICD-10-CM | POA: Diagnosis not present

## 2016-09-27 DIAGNOSIS — E041 Nontoxic single thyroid nodule: Secondary | ICD-10-CM | POA: Diagnosis not present

## 2017-01-28 ENCOUNTER — Ambulatory Visit (INDEPENDENT_AMBULATORY_CARE_PROVIDER_SITE_OTHER): Payer: Medicare Other | Admitting: Obstetrics & Gynecology

## 2017-01-28 ENCOUNTER — Encounter: Payer: Self-pay | Admitting: Obstetrics & Gynecology

## 2017-01-28 ENCOUNTER — Other Ambulatory Visit (HOSPITAL_COMMUNITY)
Admission: RE | Admit: 2017-01-28 | Discharge: 2017-01-28 | Disposition: A | Discharge status: Home / Self Care | Payer: Medicare Other | Source: Ambulatory Visit | Attending: Obstetrics & Gynecology | Admitting: Obstetrics & Gynecology

## 2017-01-28 VITALS — BP 108/70 | HR 72 | Resp 14 | Ht 63.5 in | Wt 122.0 lb

## 2017-01-28 DIAGNOSIS — D251 Intramural leiomyoma of uterus: Secondary | ICD-10-CM

## 2017-01-28 DIAGNOSIS — Z124 Encounter for screening for malignant neoplasm of cervix: Secondary | ICD-10-CM | POA: Insufficient documentation

## 2017-01-28 DIAGNOSIS — N904 Leukoplakia of vulva: Secondary | ICD-10-CM | POA: Diagnosis not present

## 2017-01-28 DIAGNOSIS — R200 Anesthesia of skin: Secondary | ICD-10-CM | POA: Diagnosis not present

## 2017-01-28 DIAGNOSIS — N9089 Other specified noninflammatory disorders of vulva and perineum: Secondary | ICD-10-CM

## 2017-01-28 DIAGNOSIS — Z01419 Encounter for gynecological examination (general) (routine) without abnormal findings: Secondary | ICD-10-CM | POA: Diagnosis not present

## 2017-01-28 NOTE — Patient Instructions (Signed)
Shingrix vaccination

## 2017-01-28 NOTE — Progress Notes (Addendum)
66 y.o. [name] here for annual exam.  Doing well.  No vaginal bleeding.  Exercising regularly.    Stopped osphena this past year.  Reports she doesn't really need it at this point.  With same partner.    Having issues with intermittent left arm tingling/numbness.  Does seem some positional.  Was on the right at times but has fully resolved.  No chest pain/SOB.  Exercises regularly.  Wants my opinion.  PCP:  Dr. Addison Lank.  Has appt in January.  Planning blood work then.  Patient's last menstrual period was 06/12/2007.          Sexually active: Yes.    The current method of family planning is post menopausal status.    Exercising: Yes.    Gym/ health club routine includes treadmill. Weight training. Aerobics.  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 She has never used smokeless tobacco. She reports that she drinks about 6.0 - 7.8 oz of alcohol per week . She reports that she does not use drugs.  Past Medical History:  Diagnosis Date  . Atypical lobular hyperplasia of left breast   . 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 Osteoporosis   . Thyroid nodule 4/09   right  . Vaginal atrophy   . Wears glasses     Past Surgical History:  Procedure Laterality Date  . BREAST LUMPECTOMY WITH RADIOACTIVE SEED LOCALIZATION Left 08/02/2014   Procedure: BREAST LUMPECTOMY WITH RADIOACTIVE SEED LOCALIZATION;  Surgeon: Autumn Messing III, MD;  Location: [place];  Service: General;   Laterality: Left;  . COLONOSCOPY    . COMBINED HYSTEROSCOPY DIAGNOSTIC / D&C  05/31/2008  . DILATION AND CURETTAGE OF UTERUS    . UPPER GI ENDOSCOPY      Current Outpatient Prescriptions  Medication Sig Dispense Refill  . Calcium Carbonate-Vit D-Min (CALCIUM 1200 PO) Take by mouth.    . cholecalciferol (VITAMIN D) 1000 UNITS tablet Take 2,000 Units by mouth daily.     . folic acid (FOLVITE) 053 MCG tablet Take 400 mcg by mouth daily.    . Lactobacillus (PROBIOTIC ACIDOPHILUS PO) Take by mouth.    . loratadine (CLARITIN) 10 MG tablet Take 10 mg by mouth daily.    . Multiple Vitamin (MULTIVITAMIN) tablet Take 1 tablet by mouth daily.    Marland Kitchen omeprazole (PRILOSEC) 20 MG capsule Take 20 mg by mouth as directed. Take 40 mg daily for a month; then 20mg  daily .    . vitamin B-12 (CYANOCOBALAMIN) 100 MCG tablet Take 100 mcg by mouth 3 (three) times a week.    . vitamin C (ASCORBIC ACID) 500 MG tablet Take 500 mg by mouth daily.     No current facility-administered medications for this visit.     Family History  Problem Relation Age of Onset  . Hypertension Father   . Cancer Brother        skin  . Osteoporosis Mother   . Hyperlipidemia Mother   . Atrial fibrillation Mother   .  Cancer Paternal Aunt 68       breast cancer     ROS:  Pertinent items are noted in HPI.  Otherwise, a comprehensive ROS was negative.  Exam:   BP 108/70 (BP Location: Right Arm, Patient Position: Sitting, Cuff Size: Normal)   Pulse 72   Resp 14   Ht 5' 3.5" (1.613 m)   Wt 122 lb (55.3 kg)   LMP 06/12/2007   BMI 21.27 kg/m   Weight change:   Height: 5' 3.5" (161.3 cm)  Ht Readings from Last 3 Encounters:  01/28/17 5' 3.5" (1.613 m)  03/30/16 5\' 4"  (1.626 m)  03/06/16 5\' 4"  (1.626 m)    General appearance: alert, cooperative and appears stated age Head: Normocephalic, without obvious abnormality, atraumatic Neck: no adenopathy, supple, symmetrical, trachea midline and thyroid normal to inspection and  palpation Lungs: clear to auscultation bilaterally Breasts: normal appearance, no masses or tenderness Heart: regular rate and rhythm Abdomen: soft, non-tender; bowel sounds normal; no masses,  no organomegaly Extremities: extremities normal, atraumatic, no cyanosis or edema Skin: Skin color, texture, turgor normal. No rashes or lesions Lymph nodes: Cervical, supraclavicular, and axillary nodes normal. No abnormal inguinal nodes palpated Neurologic: Grossly normal   Pelvic: External genitalia:  Raised erythematous, non tender, nodular lesion right and inferiorly from introitus              Urethra:  normal appearing urethra with no masses, tenderness or lesions              Bartholins and Skenes: normal                 Vagina: normal appearing vagina with normal color and discharge, no lesions              Cervix: no lesions              Pap taken: Yes.   Bimanual Exam:  Uterus:  normal size, contour, position, consistency, mobility, non-tender              Adnexa: normal adnexa and no mass, fullness, tenderness               Rectovaginal: Confirms               Anus:  normal sphincter tone, no lesions  Excision of lesion for diagnostic purposes recommended.  Verbal consent obtained.  Area cleansed with Betadine x 3.  0.5cc 1% Lidocaine instilled without dififculty.  Area fully excised with sterile pick-ups and scissors.  Silver nitrate used for excellent hemostasis.  Pt tolerated procedure well.    Chaperone was present for exam.  A:  Well Woman with normal exam H/o atypical lobular hyperplasia Osteoporosis.  Doing regular exercise and Vit D with clacium.  Declines treatment.  Plan repeat next year. 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 Lynann Bologna Return annually or prn

## 2017-01-29 ENCOUNTER — Telehealth: Payer: Self-pay | Admitting: Obstetrics & Gynecology

## 2017-01-29 NOTE — Telephone Encounter (Signed)
Placed call to patient to review benefit for scheduled ultrasound. Left voicemail message requesting a return call.

## 2017-01-30 LAB — CYTOLOGY - PAP
DIAGNOSIS: NEGATIVE
HPV (WINDOPATH): NOT DETECTED

## 2017-01-30 NOTE — Telephone Encounter (Signed)
Patient returned call. Reviewed benefit for scheduled ultrasound. Patient understood and agreeable. Patient scheduled 01/31/17 with Dr Sabra Heck. Patient aware of date, arrival time and cancellation policy. No further questions. Ok to close   cc: Dr Sabra Heck

## 2017-01-30 NOTE — Telephone Encounter (Signed)
Patient returning your call.

## 2017-01-31 ENCOUNTER — Ambulatory Visit (INDEPENDENT_AMBULATORY_CARE_PROVIDER_SITE_OTHER): Payer: Medicare Other

## 2017-01-31 ENCOUNTER — Other Ambulatory Visit: Payer: Self-pay | Admitting: Obstetrics & Gynecology

## 2017-01-31 ENCOUNTER — Ambulatory Visit (INDEPENDENT_AMBULATORY_CARE_PROVIDER_SITE_OTHER): Payer: Medicare Other | Admitting: Obstetrics & Gynecology

## 2017-01-31 ENCOUNTER — Encounter: Payer: Self-pay | Admitting: Obstetrics & Gynecology

## 2017-01-31 VITALS — BP 104/78 | HR 56 | Resp 14

## 2017-01-31 DIAGNOSIS — D251 Intramural leiomyoma of uterus: Secondary | ICD-10-CM

## 2017-01-31 DIAGNOSIS — Z9189 Other specified personal risk factors, not elsewhere classified: Secondary | ICD-10-CM | POA: Diagnosis not present

## 2017-01-31 DIAGNOSIS — N9489 Other specified conditions associated with female genital organs and menstrual cycle: Secondary | ICD-10-CM | POA: Diagnosis not present

## 2017-01-31 NOTE — Addendum Note (Signed)
Addended by: Megan Salon on: 01/31/2017 01:13 PM   Modules accepted: Orders

## 2017-01-31 NOTE — Progress Notes (Signed)
66 y.o. G0 Singlefemale here for a pelvic ultrasound with sonohystogram due to history of probable endometrial polyp as well as long term use of talc powder and concerns regarding ovarian cancer.  Denies vaginal bleeding  Patient's last menstrual period was 06/12/2007.  Contraception: PMP  Technique:  Both transabdominal and transvaginal ultrasound examinations of the pelvis were performed. Transabdominal technique was performed for global imaging of the pelvis including uterus, ovaries, adnexal regions, and pelvic cul-de-sac.  It was necessary to proceed with endovaginal exam following the abdominal ultrasound transabdominal exam to visualize the endometrium and adnexa.  Color and duplex Doppler ultrasound was utilized to evaluate blood flow to the ovaries.    FINDINGS: Uterus: 5.7 x 3.2 x 2.4cm wth 1.9 x 2.1cm and 1.6 cm fibroids, intramural Endometrium: 2.4 - 8.66mm, possible mass Adnexa:  Left: 2.0 x 0.9 x 0.6cm     Right:  1.7 x 0.8 x 0.8cm Cul de sac:  SHSG:  After obtaining appropriate verbal consent from patient, the cervix was visualized using a speculum, and prepped with betadine.  A tenaculum  was applied to the cervix.  Dilation of the cervix was necessary. The catheter was passed into the uterus and sterile saline introduced, with the following findings: 7 x 77mm filling defect c/w polyp.  This was 6 x 42mm last year but measurment was without sonohysterogram.  Discussion:  As this likely polyp is asymptomatic and less than 1cm, this is ok to follow conservatively.  Pt is aware if she ever had any bleeding, that I would recommend removal.  Pt voices clear understanding.  Pt will plan to repeat this ultrasound again in one year.  Assessment: Long term use of talc powder Intramural fibroids 6 x 100mm endometrial polyp  Plan:   Repeat SHGM again one year.  Pt knows to call with any new symptoms/concerns.  ~15 minutes spent with patient >50% of time was in face to face discussion of  above.

## 2017-02-04 ENCOUNTER — Telehealth: Payer: Self-pay

## 2017-02-04 NOTE — Telephone Encounter (Signed)
-----   Message from Megan Salon, MD sent at 02/01/2017  1:54 PM EDT ----- Please let pt know her pap was normal and HR HPV negative.  She needs repeat pap and HR HPV 1 year due to +HR HPV last year.  08 recall.    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  I'd like to look at this again in 3 weeks after it is fully healed.  Thanks.

## 2017-02-04 NOTE — Telephone Encounter (Signed)
Spoke with patient. Advised of results as seen below. Patient verbalizes understanding. AEX 02/17/18. 08 recall placed. 3 week recheck scheduled for 02/21/2017 at 2:45 pm with Dr.Miller. Patient is agreeable to date and time.

## 2017-02-05 ENCOUNTER — Ambulatory Visit: Payer: BLUE CROSS/BLUE SHIELD | Admitting: Obstetrics & Gynecology

## 2017-02-21 ENCOUNTER — Ambulatory Visit (INDEPENDENT_AMBULATORY_CARE_PROVIDER_SITE_OTHER): Payer: Medicare Other | Admitting: Obstetrics & Gynecology

## 2017-02-21 ENCOUNTER — Encounter: Payer: Self-pay | Admitting: Obstetrics & Gynecology

## 2017-02-21 VITALS — BP 102/68 | HR 70 | Resp 14 | Ht 63.5 in | Wt 121.0 lb

## 2017-02-21 DIAGNOSIS — N9089 Other specified noninflammatory disorders of vulva and perineum: Secondary | ICD-10-CM

## 2017-02-24 NOTE — Progress Notes (Signed)
GYNECOLOGY  VISIT  CC:   Recheck area of biopsy  HPI: 66 y.o. G0P0000 Single [place] female here for recheck of area that was biopsies 01/28/17.  This was a small nodule that was noted at that time of AEX.  Results showed:   Vulva, biopsy, inferior and lateral to vagina on the right - ACANTHOTIC, HYPERKERATOTIC SQUAMOUS EPITHELIUM WITH UNDERLYING SUPERFICIAL LYMPHOCYTIC INFILTRATE. - NO DYSPLASIA OR MALIGNANCY IDENTIFIED. - SEE COMMENT. Microscopic Comment I do not specifically identify the features of molluscum contagiosum. The differential diagnosis includes lichen simplex chronicus and prurigo nodularis, among other entities.  Pt is aware I thought this might be molluscum contagiosum but this was not present in pathology.  She is not having any itching or discharge or pain.  She feels this has fully healed.  Pathology reviewed again with patient and differences in possible pathology reviewed.  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  As she is not having any new symptoms, feel monitoring is all that is needed at this time.  Pt has many questions.  These were all answered.  GYNECOLOGIC HISTORY: Patient's last menstrual period was 06/12/2007. Contraception: PMP Menopausal hormone therapy: none  Patient Active Problem List   Diagnosis Date Noted  . Uterine fibroid 02/03/2016  . Dyspareunia 12/05/2014  . Atypical lobular hyperplasia of left breast 09/02/2014  . Osteoporosis 02/07/2014  . Duodenal perforation ([place]) 01/30/2014    Past Medical History:  Diagnosis Date  . Atypical lobular hyperplasia of left breast   . Duodenal perforation ([place]) 8/15  . Osteoporosis   . Thyroid nodule 4/09   right  . Vaginal atrophy   . Wears glasses     Past Surgical History:  Procedure Laterality Date  . BREAST LUMPECTOMY WITH RADIOACTIVE SEED LOCALIZATION Left 08/02/2014   Procedure: BREAST LUMPECTOMY WITH RADIOACTIVE SEED  LOCALIZATION;  Surgeon: Autumn Messing III, MD;  Location: [facility];  Service: General;  Laterality: Left;  . COLONOSCOPY    . COMBINED HYSTEROSCOPY DIAGNOSTIC / D&C  05/31/2008  . DILATION AND CURETTAGE OF UTERUS    . UPPER GI ENDOSCOPY      MEDS:   Current Outpatient Prescriptions on File Prior to Visit  Medication Sig Dispense Refill  . Calcium Carbonate-Vit D-Min (CALCIUM 1200 PO) Take by mouth.    . cholecalciferol (VITAMIN D) 1000 UNITS tablet Take 2,000 Units by mouth daily.     . folic acid (FOLVITE) 458 MCG tablet Take 400 mcg by mouth daily.    . Lactobacillus (PROBIOTIC ACIDOPHILUS PO) Take by mouth.    . loratadine (CLARITIN) 10 MG tablet Take 10 mg by mouth daily.    . Multiple Vitamin (MULTIVITAMIN) tablet Take 1 tablet by mouth daily.    Marland Kitchen omeprazole (PRILOSEC) 20 MG capsule Take 20 mg by mouth as directed. Take 40 mg daily for a month; then 20mg  daily .    . vitamin B-12 (CYANOCOBALAMIN) 100 MCG tablet Take 100 mcg by mouth 3 (three) times a week.    . vitamin C (ASCORBIC ACID) 500 MG tablet Take 500 mg by mouth daily.     No current facility-administered medications on file prior to visit.     ALLERGIES: Ativan [lorazepam]; Morphine and related; Penicillins; Amoxicillin; Erythromycin; and Other  Family History  Problem Relation Age of Onset  . Hypertension Father   . Cancer Brother        skin  . Osteoporosis Mother   .  Hyperlipidemia Mother   . Atrial fibrillation Mother   . Cancer Paternal Aunt 45       breast cancer     SH:  Single, non smoker  Review of Systems  All other systems reviewed and are negative.   PHYSICAL EXAMINATION:    BP 102/68 (BP Location: Right Arm, Patient Position: Sitting, Cuff Size: Normal)   Pulse 70   Resp 14   Ht 5' 3.5" (1.613 m)   Wt 121 lb (54.9 kg)   LMP 06/12/2007   BMI 21.10 kg/m     Physical Exam  Constitutional: She is oriented to person, place, and time. She appears well-developed and  well-nourished.  Genitourinary: Vagina normal.    No labial fusion. There is no rash, tenderness, lesion or injury on the right labia. There is no rash, tenderness, lesion or injury on the left labia.  Lymphadenopathy:       Right: No inguinal adenopathy present.       Left: No inguinal adenopathy present.  Neurological: She is alert and oriented to person, place, and time.  Skin: Skin is warm and dry.  Psychiatric: She has a normal mood and affect.    Chaperone was present for exam.  Assessment: Vulvar nodule that has been fully removed and she is without symptoms  Plan: Will follow conservatively.  Pt will monitor over the next several months and call back if she sees any new findings.   ~15 minutes spent with patient >50% of time was in face to face discussion of pathology findings, symptoms, treatments in additional to the exam performed today.

## 2017-02-26 DIAGNOSIS — M5030 Other cervical disc degeneration, unspecified cervical region: Secondary | ICD-10-CM | POA: Diagnosis not present

## 2017-04-04 ENCOUNTER — Ambulatory Visit: Payer: BLUE CROSS/BLUE SHIELD | Admitting: Obstetrics & Gynecology

## 2017-04-12 DIAGNOSIS — Z23 Encounter for immunization: Secondary | ICD-10-CM | POA: Diagnosis not present

## 2017-04-17 ENCOUNTER — Telehealth: Payer: Self-pay | Admitting: Obstetrics & Gynecology

## 2017-04-17 NOTE — Telephone Encounter (Signed)
Patient called and said she has some "skin dots" she'd like Dr. Sabra Heck to look at and possibly remove. She said Dr. Sabra Heck has previously removed something similar.

## 2017-04-17 NOTE — Telephone Encounter (Signed)
Returned call to patient. Patient states that she was just seen by Dr. Sabra Heck for her annual physical back in August and she feels she has a bump similar to one Dr. Sabra Heck removed at that appointment. Patient states she has "2 small dots" she can feel that are near her rectal area. Patient states they are in an "awkward position" so she can't see them, but they feel like what she had removed before. RN advised office visit needed for further evaluation. Patient agreeable. Patient scheduled for Friday 04/19/17 at 1330 with Dr. Sabra Heck. Patient agreeable to date and time of appointment. Aware to arrive 15 minutes prior to appointment.   Routing to provider for final review. Patient agreeable to disposition. Will close encounter.

## 2017-04-19 ENCOUNTER — Ambulatory Visit (INDEPENDENT_AMBULATORY_CARE_PROVIDER_SITE_OTHER): Payer: Medicare Other | Admitting: Obstetrics & Gynecology

## 2017-04-19 VITALS — BP 100/68 | HR 80 | Resp 16 | Ht 63.5 in | Wt 121.0 lb

## 2017-04-19 DIAGNOSIS — N9089 Other specified noninflammatory disorders of vulva and perineum: Secondary | ICD-10-CM

## 2017-04-19 MED ORDER — BETAMETHASONE DIPROPIONATE 0.05 % EX CREA: TOPICAL_CREAM | Freq: Two times a day (BID) | CUTANEOUS | 0 refills | Status: DC

## 2017-04-19 NOTE — Progress Notes (Signed)
GYNECOLOGY  VISIT  CC:   Check vulvar bumps  HPI: 66 y.o. G0P0000 Single Caucasian female here for skin check.  Has noted over the past two weeks two small, non-tender bumps on the left side of the buttocks, near the gluteal cleft.  There is no drainage.  She does have some mild itching.  She has looked in the mirror and thinks it looks like a similar one that I removed from the vulvar and sent to pathology.  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  Reviewed this again today. Questions answered.  GYNECOLOGIC HISTORY: Patient's last menstrual period was 06/12/2007. Contraception: post menopausal  Menopausal hormone therapy: none  Patient Active Problem List   Diagnosis Date Noted  . Uterine fibroid 02/03/2016  . Dyspareunia 12/05/2014  . Atypical lobular hyperplasia of left breast 09/02/2014  . Osteoporosis 02/07/2014  . Duodenal perforation ([place]) 01/30/2014    Past Medical History:  Diagnosis Date  . Atypical lobular hyperplasia of left breast   . Duodenal perforation ([place]) 8/15  . Osteoporosis   . Thyroid nodule 4/09   right  . Vaginal atrophy   . Wears glasses     Past Surgical History:  Procedure Laterality Date  . COLONOSCOPY    . COMBINED HYSTEROSCOPY DIAGNOSTIC / D&C  05/31/2008  . DILATION AND CURETTAGE OF UTERUS    . UPPER GI ENDOSCOPY      MEDS:   Current Outpatient Medications on File Prior to Visit  Medication Sig Dispense Refill  . Calcium Carbonate-Vit D-Min (CALCIUM 1200 PO) Take by mouth.    . cholecalciferol (VITAMIN D) 1000 UNITS tablet Take 2,000 Units by mouth daily.     . folic acid (FOLVITE) 976 MCG tablet Take 400 mcg by mouth daily.    . Lactobacillus (PROBIOTIC ACIDOPHILUS PO) Take by mouth.    . loratadine (CLARITIN) 10 MG tablet Take 10 mg by mouth daily.    . Multiple Vitamin (MULTIVITAMIN) tablet Take 1 tablet by mouth daily.    Marland Kitchen omeprazole (PRILOSEC) 20 MG capsule Take 20  mg by mouth as directed. Take 40 mg daily for a month; then 20mg  daily .    . vitamin B-12 (CYANOCOBALAMIN) 100 MCG tablet Take 100 mcg by mouth 3 (three) times a week.    . vitamin C (ASCORBIC ACID) 500 MG tablet Take 500 mg by mouth daily.     No current facility-administered medications on file prior to visit.     ALLERGIES: Ativan [lorazepam]; Morphine and related; Penicillins; Amoxicillin; Erythromycin; and Other  Family History  Problem Relation Age of Onset  . Hypertension Father   . Cancer Brother        skin  . Osteoporosis Mother   . Hyperlipidemia Mother   . Atrial fibrillation Mother   . Cancer Paternal Aunt 45       breast cancer     SH:  Single but in stable relationship, non smoker  Review of Systems  All other systems reviewed and are negative.   PHYSICAL EXAMINATION:    BP 100/68 (BP Location: Right Arm, Patient Position: Sitting, Cuff Size: Normal)   Pulse 80   Resp 16   Ht 5' 3.5" (1.613 m)   Wt 121 lb (54.9 kg)   LMP 06/12/2007   BMI 21.10 kg/m     Physical Exam  Constitutional: She appears well-developed and well-nourished.  Genitourinary:    No labial fusion. There is no rash, tenderness,  lesion or injury on the right labia. There is no rash, tenderness, lesion or injury on the left labia.  Lymphadenopathy:       Right: No inguinal adenopathy present.       Left: No inguinal adenopathy present.    Physical Exam  Constitutional: She appears well-developed and well-nourished.  Genitourinary: No labial fusion. There is no rash, tenderness, lesion, injury or Bartholin's cyst on the right labia. There is no rash, tenderness, lesion, injury or Bartholin's cyst on the left labia.     Lymphadenopathy:       Right: No inguinal adenopathy present.       Left: No inguinal adenopathy present.    Assessment: Small pink nodules on left of buttocks H/o similar lesion that was biopsied and showed a pruritic nodule  Plan: Will treat with topical  steroid to see if will help.  Betamethasone rx to pharmacy.  Pt will give update in 2 weeks.     ~15 minutes spent with patient >50% of time was in face to face discussion of above.  She had many questions today.

## 2017-04-22 ENCOUNTER — Encounter: Payer: Self-pay | Admitting: Obstetrics & Gynecology

## 2017-06-17 DIAGNOSIS — H2513 Age-related nuclear cataract, bilateral: Secondary | ICD-10-CM | POA: Diagnosis not present

## 2017-07-11 ENCOUNTER — Telehealth: Payer: Self-pay | Admitting: Obstetrics & Gynecology

## 2017-07-11 NOTE — Telephone Encounter (Signed)
Patient called and left a message during lunch requesting assistance with getting authorization from her [place] coverage for a shingles vaccination. She said once they approve it, she can get it done at her [facility] she goes to.  [place]: [phone]

## 2017-07-12 NOTE — Telephone Encounter (Signed)
Spoke with patient, states she is unable to talk right now, will return call to office.

## 2017-07-15 NOTE — Telephone Encounter (Signed)
Patient called during lunch and left a message requesting a call back.

## 2017-07-15 NOTE — Telephone Encounter (Signed)
Left message to call Brianna Fuller at [phone].  

## 2017-07-15 NOTE — Telephone Encounter (Signed)
Spoke with patient. Patient states she lost previous written RX for Shingrix vaccine. Patient requesting new order for vaccine to [facility] on file. Patient has been advised by pharmacy that vaccine is on backorder. Patient states authorization will need to be completed through [employer], number provided.   Advised patient will review RX with Dr. Sabra Heck, once RX submitted, can complete PA. Patient verbalizes understanding and is agreeable.   Pended order for Shingrix vaccine now and repeat 2-6 months. Fax documentation to office.   Dr. Sabra Heck - ok to proceed with Shingrix RX as ordered?

## 2017-07-15 NOTE — Telephone Encounter (Signed)
Yes.  I've never heard of a prior authorization for the Shingrix vaccination.  It is likely not going to be covered for her, just FYI.  Will be out of pocket cost most likely.

## 2017-07-16 MED ORDER — ZOSTER VAC RECOMB ADJUVANTED 50 MCG/0.5ML IM SUSR: INTRAMUSCULAR | 1 refills | Status: DC

## 2017-07-16 NOTE — Telephone Encounter (Signed)
Spoke with patient, advised as seen below per Dr. Sabra Heck. Patient aware no PA initiated. Patient will f/u with [place]. Patient verbalizes understanding and is agreeable.   Rx for Shingrix placed.    Routing to provider for final review. Patient is agreeable to disposition. Will close encounter.

## 2017-09-12 DIAGNOSIS — K5730 Diverticulosis of large intestine without perforation or abscess without bleeding: Secondary | ICD-10-CM | POA: Diagnosis not present

## 2017-09-12 DIAGNOSIS — K219 Gastro-esophageal reflux disease without esophagitis: Secondary | ICD-10-CM | POA: Diagnosis not present

## 2017-09-12 DIAGNOSIS — Z1231 Encounter for screening mammogram for malignant neoplasm of breast: Secondary | ICD-10-CM | POA: Diagnosis not present

## 2017-10-08 DIAGNOSIS — Z0000 Encounter for general adult medical examination without abnormal findings: Secondary | ICD-10-CM | POA: Diagnosis not present

## 2017-10-08 DIAGNOSIS — Z1211 Encounter for screening for malignant neoplasm of colon: Secondary | ICD-10-CM | POA: Diagnosis not present

## 2017-10-08 DIAGNOSIS — H9193 Unspecified hearing loss, bilateral: Secondary | ICD-10-CM | POA: Diagnosis not present

## 2017-10-08 DIAGNOSIS — N6092 Unspecified benign mammary dysplasia of left breast: Secondary | ICD-10-CM | POA: Diagnosis not present

## 2017-10-08 DIAGNOSIS — Z13220 Encounter for screening for lipoid disorders: Secondary | ICD-10-CM | POA: Diagnosis not present

## 2017-10-08 DIAGNOSIS — Z23 Encounter for immunization: Secondary | ICD-10-CM | POA: Diagnosis not present

## 2017-10-08 DIAGNOSIS — Z5181 Encounter for therapeutic drug level monitoring: Secondary | ICD-10-CM | POA: Diagnosis not present

## 2017-10-08 DIAGNOSIS — M810 Age-related osteoporosis without current pathological fracture: Secondary | ICD-10-CM | POA: Diagnosis not present

## 2017-10-08 DIAGNOSIS — Z131 Encounter for screening for diabetes mellitus: Secondary | ICD-10-CM | POA: Diagnosis not present

## 2017-10-08 DIAGNOSIS — K265 Chronic or unspecified duodenal ulcer with perforation: Secondary | ICD-10-CM | POA: Diagnosis not present

## 2017-10-10 DIAGNOSIS — Z5181 Encounter for therapeutic drug level monitoring: Secondary | ICD-10-CM | POA: Diagnosis not present

## 2017-10-10 DIAGNOSIS — Z79899 Other long term (current) drug therapy: Secondary | ICD-10-CM | POA: Diagnosis not present

## 2017-10-10 DIAGNOSIS — H9193 Unspecified hearing loss, bilateral: Secondary | ICD-10-CM | POA: Diagnosis not present

## 2017-10-10 DIAGNOSIS — K265 Chronic or unspecified duodenal ulcer with perforation: Secondary | ICD-10-CM | POA: Diagnosis not present

## 2017-10-10 DIAGNOSIS — N6092 Unspecified benign mammary dysplasia of left breast: Secondary | ICD-10-CM | POA: Diagnosis not present

## 2017-10-10 DIAGNOSIS — Z1211 Encounter for screening for malignant neoplasm of colon: Secondary | ICD-10-CM | POA: Diagnosis not present

## 2017-10-10 DIAGNOSIS — Z0000 Encounter for general adult medical examination without abnormal findings: Secondary | ICD-10-CM | POA: Diagnosis not present

## 2017-10-10 DIAGNOSIS — Z13220 Encounter for screening for lipoid disorders: Secondary | ICD-10-CM | POA: Diagnosis not present

## 2017-10-10 DIAGNOSIS — M810 Age-related osteoporosis without current pathological fracture: Secondary | ICD-10-CM | POA: Diagnosis not present

## 2017-10-10 DIAGNOSIS — Z131 Encounter for screening for diabetes mellitus: Secondary | ICD-10-CM | POA: Diagnosis not present

## 2017-10-12 DIAGNOSIS — Z1211 Encounter for screening for malignant neoplasm of colon: Secondary | ICD-10-CM | POA: Diagnosis not present

## 2017-10-28 DIAGNOSIS — D2272 Melanocytic nevi of left lower limb, including hip: Secondary | ICD-10-CM | POA: Diagnosis not present

## 2017-10-28 DIAGNOSIS — L821 Other seborrheic keratosis: Secondary | ICD-10-CM | POA: Diagnosis not present

## 2017-10-28 DIAGNOSIS — D225 Melanocytic nevi of trunk: Secondary | ICD-10-CM | POA: Diagnosis not present

## 2017-10-28 DIAGNOSIS — D2361 Other benign neoplasm of skin of right upper limb, including shoulder: Secondary | ICD-10-CM | POA: Diagnosis not present

## 2017-10-28 DIAGNOSIS — D2262 Melanocytic nevi of left upper limb, including shoulder: Secondary | ICD-10-CM | POA: Diagnosis not present

## 2018-02-17 ENCOUNTER — Encounter: Payer: Self-pay | Admitting: Obstetrics & Gynecology

## 2018-02-17 ENCOUNTER — Ambulatory Visit (INDEPENDENT_AMBULATORY_CARE_PROVIDER_SITE_OTHER): Payer: Medicare Other | Admitting: Obstetrics & Gynecology

## 2018-02-17 ENCOUNTER — Other Ambulatory Visit (HOSPITAL_COMMUNITY)
Admission: RE | Admit: 2018-02-17 | Discharge: 2018-02-17 | Disposition: A | Discharge status: Home / Self Care | Payer: Medicare Other | Source: Ambulatory Visit | Attending: Obstetrics & Gynecology | Admitting: Obstetrics & Gynecology

## 2018-02-17 VITALS — BP 122/80 | HR 84 | Resp 16 | Ht 63.75 in | Wt 117.4 lb

## 2018-02-17 DIAGNOSIS — B977 Papillomavirus as the cause of diseases classified elsewhere: Secondary | ICD-10-CM | POA: Insufficient documentation

## 2018-02-17 DIAGNOSIS — Z01419 Encounter for gynecological examination (general) (routine) without abnormal findings: Secondary | ICD-10-CM | POA: Diagnosis not present

## 2018-02-17 DIAGNOSIS — Z124 Encounter for screening for malignant neoplasm of cervix: Secondary | ICD-10-CM | POA: Diagnosis not present

## 2018-02-17 MED ORDER — NONFORMULARY OR COMPOUNDED ITEM: 4 refills | Status: DC

## 2018-02-17 NOTE — Progress Notes (Signed)
67 y.o. [name] here for annual exam.  Doing well.  Just got engaged and is planning a spring wedding.  Denies vaginal bleeding.  Having some hot flashes that are very manageable.   Has continues to have some intermittent left arm numbness.  Has seen Dr. Lynann Bologna.    Wants to talk about Osphena again.  We also discussed Vit E vaginal suppositories.    PCP:  Dr. Theadore Nan.  Has appt later this year.    Patient's last menstrual period was 06/12/2007.          Sexually active: Yes.  The current method of family planning is post menopausal status.    Exercising: Yes.    gym, weights, walk 30 minutes 3x weekly  Smoker:  no  Health Maintenance: Pap:  01/28/17 Neg. HR HPV:neg   01/03/16 Neg. HR HPV:+detected  History of abnormal Pap:  Yes, Colpo: CIN1 MMG: 09/12/17 BIRADS1:Neg  Colonoscopy:  02/2012 Normal. F/u 10 years  BMD: 02/21/2016 Osteoporosis.  Declined treatment.   TDaP: 12/12/11 Pneumonia vaccine(s):  No Shingrix:   Completed, 4/19 and 7/19 Hep C testing: 01/03/16 Neg  Screening Labs: PCP   reports that she has never smoked. She has never used smokeless tobacco. She reports that she drinks about 10.0 - 13.0 standard drinks of alcohol per week. She reports that she does not use drugs.  Past Medical History:  Diagnosis Date  . Atypical lobular hyperplasia of left breast   . Duodenal perforation ([place]) 8/15  . Osteoporosis   . Thyroid nodule 4/09   right  . Vaginal atrophy   . Wears glasses     Past Surgical History:  Procedure Laterality Date  . BREAST LUMPECTOMY WITH RADIOACTIVE SEED LOCALIZATION Left 08/02/2014   Procedure: BREAST LUMPECTOMY WITH RADIOACTIVE SEED LOCALIZATION;  Surgeon: Autumn Messing III, MD;  Location: [facility];  Service: General;  Laterality: Left;  . COLONOSCOPY    . COMBINED HYSTEROSCOPY DIAGNOSTIC / D&C  05/31/2008  . DILATION AND CURETTAGE OF UTERUS    . UPPER GI ENDOSCOPY      Current Outpatient Medications  Medication  Sig Dispense Refill  . Calcium-Phosphorus-Vitamin D (CITRACAL +D3 PO) Take by mouth daily.    . cholecalciferol (VITAMIN D) 1000 units tablet Take 1,000 Units by mouth daily.    . folic acid (FOLVITE) 366 MCG tablet Take 400 mcg by mouth daily.    . Lactobacillus (PROBIOTIC ACIDOPHILUS PO) Take by mouth.    . loratadine (CLARITIN) 10 MG tablet Take 10 mg by mouth daily.    . Multiple Vitamin (MULTIVITAMIN) tablet Take 1 tablet by mouth daily.    Marland Kitchen omeprazole (PRILOSEC) 20 MG capsule Take 20 mg by mouth as directed. Take 40 mg daily for a month; then 20mg  daily .    . vitamin B-12 (CYANOCOBALAMIN) 500 MCG tablet Take 500 mcg by mouth daily.    . vitamin C (ASCORBIC ACID) 500 MG tablet Take 500 mg by mouth daily.     No current facility-administered medications for this visit.     Family History  Problem Relation Age of Onset  . Hypertension Father   . Cancer Brother        skin  . Osteoporosis Mother   . Hyperlipidemia Mother   . Atrial fibrillation Mother   . Cancer Paternal Aunt 21       breast cancer     Review of Systems  All other systems reviewed and are negative.   Exam:  BP 122/80 (BP Location: Right Arm, Patient Position: Sitting, Cuff Size: Normal)   Pulse 84   Resp 16   Ht 5' 3.75" (1.619 m)   Wt 117 lb 6.4 oz (53.3 kg)   LMP 06/12/2007   BMI 20.31 kg/m    Height: 5' 3.75" (161.9 cm)  Ht Readings from Last 3 Encounters:  02/17/18 5' 3.75" (1.619 m)  04/19/17 5' 3.5" (1.613 m)  02/21/17 5' 3.5" (1.613 m)    General appearance: alert, cooperative and appears stated age Head: Normocephalic, without obvious abnormality, atraumatic Neck: no adenopathy, supple, symmetrical, trachea midline and thyroid normal to inspection and palpation Lungs: clear to auscultation bilaterally Breasts: normal appearance, no masses or tenderness Heart: regular rate and rhythm Abdomen: soft, non-tender; bowel sounds normal; no masses,  no organomegaly Extremities: extremities  normal, atraumatic, no cyanosis or edema Skin: Skin color, texture, turgor normal. No rashes or lesions Lymph nodes: Cervical, supraclavicular, and axillary nodes normal. No abnormal inguinal nodes palpated Neurologic: Grossly normal   Pelvic: External genitalia:  no lesions              Urethra:  normal appearing urethra with no masses, tenderness or lesions              Bartholins and Skenes: normal                 Vagina: normal appearing vagina with normal color and discharge, no lesions              Cervix: no lesions              Pap taken: Yes.   Bimanual Exam:  Uterus:  normal size, contour, position, consistency, mobility, non-tender              Adnexa: normal adnexa and no mass, fullness, tenderness               Rectovaginal: Confirms               Anus:  normal sphincter tone, no lesions  Chaperone was present for exam.  A:  Well Woman with normal exam PMP, no HRT H/o atypical lobar hyperplasia, 2016 Osteoporosis  H/O mildly decreased B 12 in the past H/O vaginal atrophic changes H/O duodenal perforation 8/15  P:   Mammogram guidelines reviewed pap smear with HR HPV obtained today Repeat BMD with MMG in 09/2018.  Orders faxed today. Pt aware to start pneumonia vaccinations this year Vit E vaginal suppositories rx sent to [facility].  Will try this before starting any estrogen product. Return annually or prn

## 2018-02-17 NOTE — Patient Instructions (Signed)
Please call Dr. Lynann Bologna about your arm/shoulder.

## 2018-02-19 LAB — CYTOLOGY - PAP
Diagnosis: NEGATIVE
HPV: NOT DETECTED

## 2018-02-25 ENCOUNTER — Encounter: Payer: Self-pay | Admitting: Obstetrics & Gynecology

## 2018-03-05 DIAGNOSIS — L570 Actinic keratosis: Secondary | ICD-10-CM | POA: Diagnosis not present

## 2018-03-25 DIAGNOSIS — Z23 Encounter for immunization: Secondary | ICD-10-CM | POA: Diagnosis not present

## 2018-03-26 DIAGNOSIS — M5030 Other cervical disc degeneration, unspecified cervical region: Secondary | ICD-10-CM | POA: Diagnosis not present

## 2018-04-03 DIAGNOSIS — M542 Cervicalgia: Secondary | ICD-10-CM | POA: Diagnosis not present

## 2018-04-14 DIAGNOSIS — M542 Cervicalgia: Secondary | ICD-10-CM | POA: Diagnosis not present

## 2018-04-29 DIAGNOSIS — M5012 Mid-cervical disc disorder, unspecified level: Secondary | ICD-10-CM | POA: Diagnosis not present

## 2018-04-29 DIAGNOSIS — M5013 Cervical disc disorder with radiculopathy, cervicothoracic region: Secondary | ICD-10-CM | POA: Diagnosis not present

## 2018-05-06 DIAGNOSIS — M5012 Mid-cervical disc disorder, unspecified level: Secondary | ICD-10-CM | POA: Diagnosis not present

## 2018-05-06 DIAGNOSIS — M5013 Cervical disc disorder with radiculopathy, cervicothoracic region: Secondary | ICD-10-CM | POA: Diagnosis not present

## 2018-05-20 DIAGNOSIS — M5012 Mid-cervical disc disorder, unspecified level: Secondary | ICD-10-CM | POA: Diagnosis not present

## 2018-05-20 DIAGNOSIS — M5013 Cervical disc disorder with radiculopathy, cervicothoracic region: Secondary | ICD-10-CM | POA: Diagnosis not present

## 2018-05-27 DIAGNOSIS — M5012 Mid-cervical disc disorder, unspecified level: Secondary | ICD-10-CM | POA: Diagnosis not present

## 2018-05-27 DIAGNOSIS — M5013 Cervical disc disorder with radiculopathy, cervicothoracic region: Secondary | ICD-10-CM | POA: Diagnosis not present

## 2018-06-17 DIAGNOSIS — M5012 Mid-cervical disc disorder, unspecified level: Secondary | ICD-10-CM | POA: Diagnosis not present

## 2018-06-17 DIAGNOSIS — M5013 Cervical disc disorder with radiculopathy, cervicothoracic region: Secondary | ICD-10-CM | POA: Diagnosis not present

## 2018-06-24 DIAGNOSIS — H2513 Age-related nuclear cataract, bilateral: Secondary | ICD-10-CM | POA: Diagnosis not present

## 2018-06-26 DIAGNOSIS — M5013 Cervical disc disorder with radiculopathy, cervicothoracic region: Secondary | ICD-10-CM | POA: Diagnosis not present

## 2018-06-26 DIAGNOSIS — M5012 Mid-cervical disc disorder, unspecified level: Secondary | ICD-10-CM | POA: Diagnosis not present

## 2018-07-01 DIAGNOSIS — M5012 Mid-cervical disc disorder, unspecified level: Secondary | ICD-10-CM | POA: Diagnosis not present

## 2018-07-01 DIAGNOSIS — M5013 Cervical disc disorder with radiculopathy, cervicothoracic region: Secondary | ICD-10-CM | POA: Diagnosis not present

## 2018-07-04 DIAGNOSIS — M5013 Cervical disc disorder with radiculopathy, cervicothoracic region: Secondary | ICD-10-CM | POA: Diagnosis not present

## 2018-07-04 DIAGNOSIS — M5012 Mid-cervical disc disorder, unspecified level: Secondary | ICD-10-CM | POA: Diagnosis not present

## 2018-07-08 DIAGNOSIS — M5013 Cervical disc disorder with radiculopathy, cervicothoracic region: Secondary | ICD-10-CM | POA: Diagnosis not present

## 2018-07-08 DIAGNOSIS — M5012 Mid-cervical disc disorder, unspecified level: Secondary | ICD-10-CM | POA: Diagnosis not present

## 2018-07-15 DIAGNOSIS — M5012 Mid-cervical disc disorder, unspecified level: Secondary | ICD-10-CM | POA: Diagnosis not present

## 2018-07-15 DIAGNOSIS — M5013 Cervical disc disorder with radiculopathy, cervicothoracic region: Secondary | ICD-10-CM | POA: Diagnosis not present

## 2018-07-17 DIAGNOSIS — M5013 Cervical disc disorder with radiculopathy, cervicothoracic region: Secondary | ICD-10-CM | POA: Diagnosis not present

## 2018-07-17 DIAGNOSIS — M5012 Mid-cervical disc disorder, unspecified level: Secondary | ICD-10-CM | POA: Diagnosis not present

## 2018-07-22 DIAGNOSIS — M5013 Cervical disc disorder with radiculopathy, cervicothoracic region: Secondary | ICD-10-CM | POA: Diagnosis not present

## 2018-07-22 DIAGNOSIS — M5012 Mid-cervical disc disorder, unspecified level: Secondary | ICD-10-CM | POA: Diagnosis not present

## 2018-07-31 DIAGNOSIS — M5013 Cervical disc disorder with radiculopathy, cervicothoracic region: Secondary | ICD-10-CM | POA: Diagnosis not present

## 2018-07-31 DIAGNOSIS — M5012 Mid-cervical disc disorder, unspecified level: Secondary | ICD-10-CM | POA: Diagnosis not present

## 2018-08-07 DIAGNOSIS — M5012 Mid-cervical disc disorder, unspecified level: Secondary | ICD-10-CM | POA: Diagnosis not present

## 2018-08-07 DIAGNOSIS — M5013 Cervical disc disorder with radiculopathy, cervicothoracic region: Secondary | ICD-10-CM | POA: Diagnosis not present

## 2018-08-21 DIAGNOSIS — M5012 Mid-cervical disc disorder, unspecified level: Secondary | ICD-10-CM | POA: Diagnosis not present

## 2018-08-21 DIAGNOSIS — M5013 Cervical disc disorder with radiculopathy, cervicothoracic region: Secondary | ICD-10-CM | POA: Diagnosis not present

## 2018-10-28 DIAGNOSIS — Z0000 Encounter for general adult medical examination without abnormal findings: Secondary | ICD-10-CM | POA: Diagnosis not present

## 2018-10-28 DIAGNOSIS — Z1389 Encounter for screening for other disorder: Secondary | ICD-10-CM | POA: Diagnosis not present

## 2018-10-28 DIAGNOSIS — Z1211 Encounter for screening for malignant neoplasm of colon: Secondary | ICD-10-CM | POA: Diagnosis not present

## 2018-10-28 DIAGNOSIS — M810 Age-related osteoporosis without current pathological fracture: Secondary | ICD-10-CM | POA: Diagnosis not present

## 2018-10-28 DIAGNOSIS — Z5181 Encounter for therapeutic drug level monitoring: Secondary | ICD-10-CM | POA: Diagnosis not present

## 2018-10-28 DIAGNOSIS — Z131 Encounter for screening for diabetes mellitus: Secondary | ICD-10-CM | POA: Diagnosis not present

## 2018-10-28 DIAGNOSIS — Z79899 Other long term (current) drug therapy: Secondary | ICD-10-CM | POA: Diagnosis not present

## 2018-10-29 DIAGNOSIS — Z1211 Encounter for screening for malignant neoplasm of colon: Secondary | ICD-10-CM | POA: Diagnosis not present

## 2018-11-26 ENCOUNTER — Encounter: Payer: Self-pay | Admitting: Obstetrics & Gynecology

## 2018-11-26 DIAGNOSIS — M810 Age-related osteoporosis without current pathological fracture: Secondary | ICD-10-CM | POA: Diagnosis not present

## 2018-11-26 DIAGNOSIS — Z8262 Family history of osteoporosis: Secondary | ICD-10-CM | POA: Diagnosis not present

## 2018-11-26 DIAGNOSIS — Z803 Family history of malignant neoplasm of breast: Secondary | ICD-10-CM | POA: Diagnosis not present

## 2018-11-26 DIAGNOSIS — Z1231 Encounter for screening mammogram for malignant neoplasm of breast: Secondary | ICD-10-CM | POA: Diagnosis not present

## 2018-12-11 ENCOUNTER — Telehealth: Payer: Self-pay | Admitting: *Deleted

## 2018-12-11 NOTE — Telephone Encounter (Signed)
Left voicemail to call back re: BMD results.  Report on my desk.

## 2018-12-11 NOTE — Telephone Encounter (Signed)
Appointment scheduled 12/18/18 @4 :30pm

## 2018-12-11 NOTE — Telephone Encounter (Signed)
Patient is returning a call to Reina. °

## 2018-12-18 ENCOUNTER — Telehealth: Payer: Self-pay | Admitting: *Deleted

## 2018-12-18 ENCOUNTER — Telehealth: Payer: Medicare Other | Admitting: Obstetrics & Gynecology

## 2018-12-18 ENCOUNTER — Other Ambulatory Visit: Payer: Self-pay

## 2018-12-18 NOTE — Telephone Encounter (Signed)
Returned call to patient but unable to leave message, "mailbox full".

## 2018-12-18 NOTE — Telephone Encounter (Signed)
Patient cancelled her mychart video visit today and would like a call to reschedule.

## 2018-12-22 NOTE — Telephone Encounter (Signed)
Spoke with patient and rescheduled mychart Webex for 12-30-18 4:30pm. Advised patient to be in a quiet room by herself and not to be in a car traveling for optimal visit. Patient voices understanding.

## 2018-12-24 DIAGNOSIS — D2272 Melanocytic nevi of left lower limb, including hip: Secondary | ICD-10-CM | POA: Diagnosis not present

## 2018-12-24 DIAGNOSIS — D2271 Melanocytic nevi of right lower limb, including hip: Secondary | ICD-10-CM | POA: Diagnosis not present

## 2018-12-24 DIAGNOSIS — D2361 Other benign neoplasm of skin of right upper limb, including shoulder: Secondary | ICD-10-CM | POA: Diagnosis not present

## 2018-12-24 DIAGNOSIS — L821 Other seborrheic keratosis: Secondary | ICD-10-CM | POA: Diagnosis not present

## 2018-12-24 DIAGNOSIS — D2261 Melanocytic nevi of right upper limb, including shoulder: Secondary | ICD-10-CM | POA: Diagnosis not present

## 2018-12-24 DIAGNOSIS — D2262 Melanocytic nevi of left upper limb, including shoulder: Secondary | ICD-10-CM | POA: Diagnosis not present

## 2018-12-24 DIAGNOSIS — D225 Melanocytic nevi of trunk: Secondary | ICD-10-CM | POA: Diagnosis not present

## 2018-12-25 ENCOUNTER — Telehealth: Payer: Self-pay | Admitting: Obstetrics & Gynecology

## 2018-12-30 ENCOUNTER — Encounter: Payer: Self-pay | Admitting: Obstetrics & Gynecology

## 2018-12-30 ENCOUNTER — Telehealth (INDEPENDENT_AMBULATORY_CARE_PROVIDER_SITE_OTHER): Payer: Medicare Other | Admitting: Obstetrics & Gynecology

## 2018-12-30 DIAGNOSIS — M816 Localized osteoporosis [Lequesne]: Secondary | ICD-10-CM | POA: Diagnosis not present

## 2018-12-30 MED ORDER — RISEDRONATE SODIUM 35 MG PO TABS: 35.0000 mg | ORAL_TABLET | ORAL | 2 refills | Status: DC

## 2018-12-30 NOTE — Progress Notes (Signed)
Virtual Visit via Video Note  I connected with Brianna Fuller on 12/30/18 at  4:30 PM EDT by a video enabled telemedicine application and verified that I am speaking with the correct person using two identifiers.  Location: Patient: home Provider: office   I discussed the limitations of evaluation and management by telemedicine and the availability of in person appointments. The patient expressed understanding and agreed to proceed.  History of Present Illness: 74 yrs Married Caucasian G0P0000  female here to discuss recent BMD obtained 6/17/202 showing osteoporosis in her spine with T score of -3.4.     Osteoporosis Risk Factors  Nonmodifiable Personal Hx of fracture as an adult: no Hx of fracture in first-degree relative: no Caucasian race: yes Advanced age: no Dementia: no Poor health/frailty: no  Potentially modifiable: Tobacco use: no Low body weight (<127 lbs): yes Estrogen deficiency  early menopause (age <45) or bilateral ovariectomy: no Low calcium intake (lifelong): no Alcohol use more than 2 drinks per day:  Pt does have 2-3 a day Recurrent falls: no Inadequate physical activity: yes.  Doing yard work, walking.    Current calcium and Vit D intake: 600mg  calcium and 3000 IU Vit D  Review of Systems Pertinent items noted in HPI and remainder of comprehensive ROS otherwise negative.   Imaging Bone Density: Spine T Score: -3.4, Hip T Score: -2.6 (right) -2.3 (left) completed 11/26/2018 FRAX score not calculated due to osteoporosis            Observations/Objective: Osteoporosis in spine with T score -3.4   Assessment and Plan: 1.  Medications discussed including: Bisphosphonates po (Fosamax, Actonel, Boniva) Bisphosphonate IV (Reclast) Evista Prolia subcutaneous  Forteo subcutaneous She is going to try Actonel 35mg  weekly.  Rx to pharmacy.  Risks and benefits reviewed.     2.  Calcium and vit  D discussed    3.  Exercise recommended at least 30  minutes 3 times per week.   4.  Recommendation to avoid heavy ETOH use.   5.  Fall prevention discussed.  6.  Repeat bone density in 2 years.  Follow Up Instructions: I discussed the assessment and treatment plan with the patient. The patient was provided an opportunity to ask questions and all were answered. The patient agreed with the plan and demonstrated an understanding of the instructions.   The patient was advised to call back or seek an in-person evaluation if the symptoms worsen or if the condition fails to improve as anticipated.  I provided 30 minutes of non-face-to-face time during this encounter.  Megan Salon, MD

## 2019-01-21 ENCOUNTER — Telehealth: Payer: Self-pay | Admitting: *Deleted

## 2019-01-21 NOTE — Telephone Encounter (Signed)
PA submitted to plan via [email or web address] for Risedronate Sodium 35mg  tabs.  Dx: M81.6 -Osteoporosis of spine  Key: [id number] - PA Case ID: [id number]

## 2019-01-26 NOTE — Telephone Encounter (Signed)
Message left to return call to Triage Nurse at [phone].    

## 2019-01-26 NOTE — Telephone Encounter (Signed)
Patient received a letter that Medicare denied her prescription and offered a list of alternatives. She would like to talk with a nurse about options.

## 2019-01-27 DIAGNOSIS — K529 Noninfective gastroenteritis and colitis, unspecified: Secondary | ICD-10-CM | POA: Diagnosis not present

## 2019-01-27 NOTE — Telephone Encounter (Signed)
Patient is returning call to Triage nurse.

## 2019-01-27 NOTE — Telephone Encounter (Signed)
Returned call to patient. Patient advised that her plan will only cover Boniva or Fosamax and that Dr. Sabra Heck recommends Fosamax. Patient states, "I'll roll with whatever she thinks." Pharmacy confirmed as [facility] on [address].  Routing to provider to review and advise on prescription.

## 2019-01-28 ENCOUNTER — Other Ambulatory Visit: Payer: Self-pay | Admitting: Obstetrics & Gynecology

## 2019-01-28 DIAGNOSIS — K529 Noninfective gastroenteritis and colitis, unspecified: Secondary | ICD-10-CM | POA: Diagnosis not present

## 2019-01-28 MED ORDER — ALENDRONATE SODIUM 70 MG PO TABS: 70.0000 mg | ORAL_TABLET | ORAL | 12 refills | Status: DC

## 2019-01-28 NOTE — Progress Notes (Signed)
Fosamax order to pharmacy.

## 2019-01-28 NOTE — Telephone Encounter (Signed)
Rx sent for 30 day supply to pharmacy on file.  If does well with this, I can sent in 90 day supply if she desires.  Thanks.

## 2019-01-29 DIAGNOSIS — K529 Noninfective gastroenteritis and colitis, unspecified: Secondary | ICD-10-CM | POA: Diagnosis not present

## 2019-01-29 NOTE — Telephone Encounter (Signed)
Call returned to patient, no answer, mailbox full, unable to leave message.

## 2019-01-29 NOTE — Telephone Encounter (Signed)
Spoke with patient, advised as seen below per Dr. Miller. Patient verbalizes understanding and is agreeable.   Encounter closed.  

## 2019-01-30 ENCOUNTER — Telehealth: Payer: Self-pay | Admitting: Obstetrics & Gynecology

## 2019-01-30 MED ORDER — RISEDRONATE SODIUM 35 MG PO TABS: 35.0000 mg | ORAL_TABLET | ORAL | 11 refills | Status: DC

## 2019-01-30 NOTE — Telephone Encounter (Signed)
Rx signed and sent to pharmacy on file.

## 2019-01-30 NOTE — Telephone Encounter (Signed)
Patient is calling regarding question about prescription for Fosamax.

## 2019-01-30 NOTE — Telephone Encounter (Signed)
Spoke with patient. Patient states she has picked up Fosamax, but would really prefer to try the Actonel 35mg  PO q7 days that was initially discussed. Patient has checked on the cost at [facility] using Good Rx coupon and would like the RX to be sent to this location. Pharmacy updated. Advised patient I will update Dr. Sabra Heck, will return call if any additional recommendations. Advised patient to check with pharmacy over the weekend. Patient thankful for call.   Rx pended for Actonel 35 mg tab PO q7days #4/11RF.   Dr. Sabra Heck  -please advise on RX.

## 2019-04-10 DIAGNOSIS — Z23 Encounter for immunization: Secondary | ICD-10-CM | POA: Diagnosis not present

## 2019-06-19 ENCOUNTER — Ambulatory Visit: Payer: Medicare Other | Admitting: Obstetrics & Gynecology

## 2019-06-22 ENCOUNTER — Ambulatory Visit: Payer: Medicare Other | Admitting: Obstetrics & Gynecology

## 2019-07-06 DIAGNOSIS — Z20822 Contact with and (suspected) exposure to covid-19: Secondary | ICD-10-CM | POA: Diagnosis not present

## 2019-07-20 ENCOUNTER — Ambulatory Visit: Payer: Medicare Other

## 2019-08-08 ENCOUNTER — Ambulatory Visit: Payer: Medicare Other

## 2019-08-09 ENCOUNTER — Ambulatory Visit: Payer: Medicare Other

## 2019-08-11 ENCOUNTER — Ambulatory Visit: Payer: Medicare Other | Admitting: Obstetrics & Gynecology

## 2019-09-04 ENCOUNTER — Ambulatory Visit: Payer: Medicare Other | Admitting: Obstetrics & Gynecology

## 2019-10-16 DIAGNOSIS — H259 Unspecified age-related cataract: Secondary | ICD-10-CM | POA: Diagnosis not present

## 2019-10-16 DIAGNOSIS — H52223 Regular astigmatism, bilateral: Secondary | ICD-10-CM | POA: Diagnosis not present

## 2019-10-16 DIAGNOSIS — H43393 Other vitreous opacities, bilateral: Secondary | ICD-10-CM | POA: Diagnosis not present

## 2019-10-29 DIAGNOSIS — Z8719 Personal history of other diseases of the digestive system: Secondary | ICD-10-CM | POA: Diagnosis not present

## 2019-10-29 DIAGNOSIS — M810 Age-related osteoporosis without current pathological fracture: Secondary | ICD-10-CM | POA: Diagnosis not present

## 2019-10-29 DIAGNOSIS — Z0000 Encounter for general adult medical examination without abnormal findings: Secondary | ICD-10-CM | POA: Diagnosis not present

## 2019-10-29 DIAGNOSIS — Z1389 Encounter for screening for other disorder: Secondary | ICD-10-CM | POA: Diagnosis not present

## 2019-10-29 DIAGNOSIS — Z79899 Other long term (current) drug therapy: Secondary | ICD-10-CM | POA: Diagnosis not present

## 2019-10-29 DIAGNOSIS — R7309 Other abnormal glucose: Secondary | ICD-10-CM | POA: Diagnosis not present

## 2019-10-29 DIAGNOSIS — Z1211 Encounter for screening for malignant neoplasm of colon: Secondary | ICD-10-CM | POA: Diagnosis not present

## 2019-10-29 DIAGNOSIS — E041 Nontoxic single thyroid nodule: Secondary | ICD-10-CM | POA: Diagnosis not present

## 2019-11-30 NOTE — Progress Notes (Signed)
69 y.o. G0P0000 Married White or Caucasian female here for annual exam.  Doing well.  Denies vaginal bleeding.     Started Actonel last year due to osteoporosis.  Had BMD and MMG.    PCP:  Dr. Addison Lank.  Last appt was in May.    Patient's last menstrual period was 06/12/2007.          Sexually active: No.  The current method of family planning is post menopausal status.    Exercising: No.  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 Addison Lank Shingrix:   Done 2019 Hep C testing: 2017 neg Screening Labs: done last month   reports that she has never smoked. She has never used smokeless tobacco. She reports current alcohol use of about 6.0 - 8.0 standard drinks of alcohol per week. She reports that she does not use drugs.  Past Medical History:  Diagnosis Date  . Atypical lobular hyperplasia of left breast   . 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 Osteoporosis   . Thyroid nodule 4/09   right  . Vaginal atrophy   . Wears glasses     Past Surgical History:  Procedure Laterality Date  . BREAST LUMPECTOMY WITH RADIOACTIVE SEED LOCALIZATION Left 08/02/2014   Procedure: BREAST LUMPECTOMY WITH RADIOACTIVE SEED LOCALIZATION;  Surgeon: Autumn Messing III, MD;  Location: [place];  Service: General;  Laterality: Left;  . COLONOSCOPY    . COMBINED HYSTEROSCOPY DIAGNOSTIC / D&C  05/31/2008  . DILATION AND CURETTAGE OF UTERUS    . UPPER GI ENDOSCOPY      Current Outpatient Medications  Medication Sig Dispense Refill  . Calcium Citrate-Vitamin D (CALCIUM + D PO) Take by mouth.    . cholecalciferol (VITAMIN D) 1000 units tablet Take 1,000 Units by mouth  daily.    . Lactobacillus (PROBIOTIC ACIDOPHILUS PO) Take by mouth.    . loratadine (CLARITIN) 10 MG tablet Take 10 mg by mouth daily.    . Multiple Vitamin (MULTIVITAMIN) tablet Take 1 tablet by mouth daily.    Marland Kitchen omeprazole (PRILOSEC) 20 MG capsule Take 20 mg by mouth as directed. Take 40 mg daily for a month; then 20mg  daily .    . risedronate (ACTONEL) 35 MG tablet Take 1 tablet (35 mg total) by mouth every 7 (seven) days. with water on empty stomach, nothing by mouth or lie down for next 30 minutes. 4 tablet 11  . TURMERIC PO Take 500 mg by mouth daily.    . vitamin B-12 (CYANOCOBALAMIN) 500 MCG tablet Take 500 mcg by mouth. 3 times a week    . NONFORMULARY OR COMPOUNDED ITEM Vit E vaginal suppository 200U/ml.  One pv three times weekly. (Patient not taking: Reported on 12/01/2019) 36 each 4   No current facility-administered medications for this visit.    Family History  Problem Relation Age of Onset  . Hypertension Father   . Cancer Brother        skin  . Osteoporosis Mother   . Hyperlipidemia Mother   . Atrial fibrillation Mother   . Cancer Paternal Aunt 63       breast cancer  Review of Systems  Constitutional: Negative.   HENT: Negative.   Eyes: Negative.   Respiratory: Negative.   Cardiovascular: Negative.   Gastrointestinal: Negative.   Endocrine: Negative.   Genitourinary:       RLQ sensation (sharp pain, bubble feeling) occ  Musculoskeletal: Negative.   Skin: Negative.   Allergic/Immunologic: Negative.   Neurological: Negative.   Hematological: Negative.   Psychiatric/Behavioral: Negative.     Exam:   BP 116/74   Pulse 70   Temp (!) 97.2 F (36.2 C) (Skin)   Resp 16   Ht 5' 3.5" (1.613 m)   Wt 120 lb (54.4 kg)   LMP 06/12/2007   BMI 20.92 kg/m   Height: 5' 3.5" (161.3 cm)  General appearance: alert, cooperative and appears stated age Head: Normocephalic, without obvious abnormality, atraumatic Neck: no adenopathy, supple, symmetrical, trachea  midline and thyroid normal to inspection and palpation Lungs: clear to auscultation bilaterally Breasts: normal appearance, no masses or tenderness Heart: regular rate and rhythm Abdomen: soft, non-tender; bowel sounds normal; no masses,  no organomegaly Extremities: extremities normal, atraumatic, no cyanosis or edema Skin: Skin color, texture, turgor normal. No rashes or lesions Lymph nodes: Cervical, supraclavicular, and axillary nodes normal. No abnormal inguinal nodes palpated Neurologic: Grossly normal   Pelvic: External genitalia:  no lesions              Urethra:  normal appearing urethra with no masses, tenderness or lesions              Bartholins and Skenes: normal                 Vagina: normal appearing vagina with normal color and discharge, no lesions              Cervix: no lesions              Pap taken: No. Bimanual Exam:  Uterus:  normal size, contour, position, consistency, mobility, non-tender              Adnexa: normal adnexa and no mass, fullness, tenderness               Rectovaginal: Confirms               Anus:  normal sphincter tone, no lesions  Chaperone, Terence Lux, CMA, was present for exam.  A:  Well Woman with normal exam PMP, no HRT H/o atypical lobular hyperplasia, s/p lumpectomy with negative margins, 2016 Osteoporosis.  On Actonel. Vaginal atrophy H/o duodenal perforation 8/15 Endometrial polyp, uterine fibroids, h/o talc use and concern for ovarian cancer from patient  P:   Mammogram guidelines reviewed.  Doing yearly. pap smear not indicated today.  Neg pap and neg HR HPV 2018 and 2019.  H/O CIN 1 with LEEP, done due to [facility] 2017 Lab work done with Dr. Addison Lank in May Colonoscopy 9/13 Vaccinations UTD Did BMD yesterday.  Pt is aware I do not have the results today. RF for Vit E 200u/ml, one pv three times weekly.  #36/4RF. She will return for PUS to reassess endometrium and check ovaries Return annually or prn

## 2019-12-01 ENCOUNTER — Ambulatory Visit (INDEPENDENT_AMBULATORY_CARE_PROVIDER_SITE_OTHER): Payer: Medicare Other | Admitting: Obstetrics & Gynecology

## 2019-12-01 ENCOUNTER — Other Ambulatory Visit: Payer: Self-pay

## 2019-12-01 ENCOUNTER — Encounter: Payer: Self-pay | Admitting: Obstetrics & Gynecology

## 2019-12-01 VITALS — BP 116/74 | HR 70 | Temp 97.2°F | Resp 16 | Ht 63.5 in | Wt 120.0 lb

## 2019-12-01 DIAGNOSIS — N840 Polyp of corpus uteri: Secondary | ICD-10-CM

## 2019-12-01 DIAGNOSIS — D251 Intramural leiomyoma of uterus: Secondary | ICD-10-CM

## 2019-12-01 DIAGNOSIS — Z124 Encounter for screening for malignant neoplasm of cervix: Secondary | ICD-10-CM | POA: Diagnosis not present

## 2019-12-01 DIAGNOSIS — Z01419 Encounter for gynecological examination (general) (routine) without abnormal findings: Secondary | ICD-10-CM | POA: Diagnosis not present

## 2019-12-01 MED ORDER — NONFORMULARY OR COMPOUNDED ITEM: 4 refills | Status: DC

## 2019-12-02 ENCOUNTER — Telehealth: Payer: Self-pay | Admitting: Obstetrics & Gynecology

## 2019-12-02 NOTE — Telephone Encounter (Signed)
Call to patient. Per DPR, OK to leave message on voicemail.  Left voicemail requesting a return call to [facility] to review benefits and schedule recommended Pelvic ultrasound with M. Suzanne Miller, MD 

## 2019-12-04 NOTE — Telephone Encounter (Signed)
Patient is returning call.  °

## 2019-12-07 NOTE — Telephone Encounter (Signed)
Call to patient. Per DPR, OK to leave message on voicemail.  Left voicemail requesting a return call to [facility] to review benefits and schedule recommended Pelvic ultrasound with M. Suzanne Miller, MD 

## 2019-12-09 ENCOUNTER — Telehealth: Payer: Self-pay

## 2019-12-09 NOTE — Telephone Encounter (Signed)
Patient notified of bone density results. See scanned in report 

## 2019-12-09 NOTE — Telephone Encounter (Signed)
Call to patient. Per DPR, OK to leave message on voicemail.  Left voicemail requesting a return call to [facility] to review benefits and schedule recommended Pelvic ultrasound with M. Suzanne Miller, MD 

## 2019-12-15 NOTE — Telephone Encounter (Signed)
Call to patient. Unable to leave voicemail due to mailbox being full, requesting a return call to [facility] to review benefits and schedule recommended Pelvic ultrasound with Jerilynn Mages. Edwinna Areola, MD

## 2019-12-15 NOTE — Telephone Encounter (Signed)
Patient left message returning call to [facility].  °

## 2019-12-15 NOTE — Telephone Encounter (Signed)
Call to patient. Unable to leave voicemail due to mailbox being full, requesting a return call to [facility] to review benefits and schedule recommended Pelvic ultrasound with Jerilynn Mages. Edwinna Areola, MD   Dr. Sabra Heck, please advise as this is the third attempt to contact the patient.

## 2019-12-17 NOTE — Telephone Encounter (Signed)
As this pt is so hard to reach, is it possible to put the information in a letter and then ask pt to call and schedule when ready.

## 2019-12-18 NOTE — Telephone Encounter (Signed)
Patient is returning call.  °

## 2019-12-22 ENCOUNTER — Encounter: Payer: Self-pay | Admitting: Obstetrics & Gynecology

## 2019-12-29 NOTE — Telephone Encounter (Signed)
Spoke with patient regarding benefits for recommended ultrasound. Patient is aware that ultrasound is transvaginal. Patient acknowledges understanding of information presented. Patient is aware of cancellation policy. Patient scheduled appointment for 01/14/2020 at 0200PM with M. Edwinna Areola, MD. Encounter closed.

## 2019-12-30 ENCOUNTER — Other Ambulatory Visit: Payer: Self-pay | Admitting: Obstetrics & Gynecology

## 2019-12-30 NOTE — Telephone Encounter (Signed)
Medication refill request: Actonel  Last AEX:  12-01-19 SM  Next AEX: 01-27-21 Last BMD: 12-22-19  Refill authorized: Today, please advise.   Medication pended for #4, 0RF. Please refill if appropriate.

## 2020-01-14 ENCOUNTER — Other Ambulatory Visit: Payer: Self-pay

## 2020-01-14 ENCOUNTER — Ambulatory Visit (INDEPENDENT_AMBULATORY_CARE_PROVIDER_SITE_OTHER): Payer: Medicare Other

## 2020-01-14 ENCOUNTER — Ambulatory Visit (INDEPENDENT_AMBULATORY_CARE_PROVIDER_SITE_OTHER): Payer: Medicare Other | Admitting: Obstetrics & Gynecology

## 2020-01-14 ENCOUNTER — Encounter: Payer: Self-pay | Admitting: Obstetrics & Gynecology

## 2020-01-14 VITALS — BP 118/64 | HR 67 | Resp 12 | Ht 64.0 in | Wt 121.2 lb

## 2020-01-14 DIAGNOSIS — D251 Intramural leiomyoma of uterus: Secondary | ICD-10-CM

## 2020-01-14 DIAGNOSIS — N840 Polyp of corpus uteri: Secondary | ICD-10-CM

## 2020-01-14 NOTE — Progress Notes (Signed)
69 y.o. G0P0000 Married White or Caucasian female here for pelvic ultrasound due to endometrial polyp that was noted in 2018.  We have discussed removal but she has been hesitant to proceed so this has been watch conservatively.  She does not have any vaginal bleeding.  .  Patient's last menstrual period was 06/12/2007.  Contraception: PMP  Findings:  UTERUS: 5.5 x 3.5 x 2.4cm with 2 intramural fibroids measuring 1.3cm and 1.7cm EMS:7.49mm with 9 x 33mm endometrial focus, feeder vessel identified ADNEXA: Left ovary: 1.9 x 1.0 x 0.6cm        Right ovary: 2.2 x 1.0 x 0.8cm CUL DE SAC:  No free lfuid  Discussion:  Ultrasonographer supervised.  Findings reviewed.  Persistent endometrial lesion c/w polyp noted.  Guidelines reviewed with pt for removal of polyps in PMP females.  She is in agreement with removal at this time.  Low risk of malignancy is reviewed but because this is impossible to know for sure without pathology, she understands reasoning.  Procedure discussed with patient.  Recovery and pain management discussed.  Risks discussed including but not limited to bleeding, rare risk of transfusion, infection, 1% risk of uterine perforation with risks of fluid deficit causing cardiac arrythmia, cerebral swelling and/or need to stop procedure early.  Fluid emboli and rare risk of death discussed.  DVT/PE, rare risk of risk of bowel/bladder/ureteral/vascular injury.  Patient aware if pathology abnormal she may need additional treatment.  All questions answered.    Assessment:  Small intramural fibroids Endometrial polyp  Plan:  Hysteroscopy with polyp resection will be planned.    After review of ultrasound, additional 30 minutes spent with pt reviewing current guidelines, discussing procedure, hospital stay, recovery, risks and benefits for procedure.

## 2020-01-18 ENCOUNTER — Telehealth: Payer: Self-pay | Admitting: Obstetrics & Gynecology

## 2020-01-18 NOTE — Telephone Encounter (Signed)
Call to patient. Per DPR, OK to leave message on voicemail.   Left voicemail requesting a return call to [facility] to review benefits and schedule recommended surgery with M. Suzanne Miller, MD. 

## 2020-01-27 ENCOUNTER — Other Ambulatory Visit: Payer: Self-pay | Admitting: Obstetrics & Gynecology

## 2020-01-27 NOTE — Telephone Encounter (Signed)
Medication refill request: Actonel  Last AEX:  12-01-19 SM  Next AEX: 01-27-21  Last BMD: 11-30-19 osteoporosis  Refill authorized: Today, please advise.   Medication pended for #12, 3RF. Please refill if appropriate.

## 2020-02-01 NOTE — Telephone Encounter (Signed)
Call to patient. Per DPR, OK to leave message on voicemail.   Left voicemail requesting a return call to [facility] to review benefits and schedule recommended surgery with M. Suzanne Miller, MD. 

## 2020-02-02 DIAGNOSIS — K219 Gastro-esophageal reflux disease without esophagitis: Secondary | ICD-10-CM | POA: Diagnosis not present

## 2020-02-02 DIAGNOSIS — K5730 Diverticulosis of large intestine without perforation or abscess without bleeding: Secondary | ICD-10-CM | POA: Diagnosis not present

## 2020-02-04 NOTE — Telephone Encounter (Signed)
Patietn is returning call.

## 2020-02-04 NOTE — Telephone Encounter (Signed)
Spoke with patient regarding surgery benefits. Patient acknowledges understanding of information presented. Patient is aware that benefits presented are for professional benefits only. Patient is aware that once surgery is scheduled, the hospital will call with separate benefits. Patient is aware of surgery cancellation policy.  Patient would like to proceed with surgery on February 29, 2020. Informed patient that is typically a day that we do surgery at [facility], but that time could possibly be requested. Patient aware to expect a return call to confirm whether or not that time was able to be requested.   Routing to [place] for surgery scheduling.

## 2020-02-05 NOTE — Telephone Encounter (Signed)
Call to patient. Advised 9-20 is not available due to pandemic limitations at [facility] facility. Offered alternative of 9-21 at [facility] and patient is agreeable. Advised will request this and once confirmed, which may be a little longer due to pandemic (rescheduling cases) , she will receive call back to complete Covid test scheduling and pre-op instructions.

## 2020-02-08 NOTE — Telephone Encounter (Signed)
Left message to call Traver Meckes at [phone]. 

## 2020-02-10 NOTE — Telephone Encounter (Signed)
Spoke with patient. Pre op scheduled for 02/18/2020 at 3:30 pm with Dr.Miller. COVID test scheduled for 02/26/2020 at 2:30 pm at [facility] location. Patient is aware of the need to quarantine after test until surgery. 2 week post op scheduled for 03/15/2020 at 4:30 pm with Dr.Miller. Patient is agreeable to date and time. Surgery instructions reviewed. Patient verbalizes understanding. Surgery packet to be given to patient at pre op appointment.  Routing to provider and will close encounter.

## 2020-02-16 ENCOUNTER — Other Ambulatory Visit: Payer: Self-pay | Admitting: Obstetrics & Gynecology

## 2020-02-18 ENCOUNTER — Encounter: Payer: Self-pay | Admitting: Obstetrics & Gynecology

## 2020-02-18 ENCOUNTER — Ambulatory Visit (INDEPENDENT_AMBULATORY_CARE_PROVIDER_SITE_OTHER): Payer: Medicare Other | Admitting: Obstetrics & Gynecology

## 2020-02-18 ENCOUNTER — Other Ambulatory Visit: Payer: Self-pay

## 2020-02-18 VITALS — BP 100/68 | HR 68 | Resp 16 | Wt 121.0 lb

## 2020-02-18 DIAGNOSIS — N840 Polyp of corpus uteri: Secondary | ICD-10-CM

## 2020-02-18 DIAGNOSIS — N9489 Other specified conditions associated with female genital organs and menstrual cycle: Secondary | ICD-10-CM

## 2020-02-18 NOTE — Progress Notes (Signed)
69 y.o. G0P0000 Married White or Caucasian female here for discussion of upcoming procedure.  Hysteroscopy with polyp planned due to endometrial polyp.  Pre-op evaluation thus far has included pelvic ultrasound showing continued presence of endometrial mass c/w polyp.  She has not had any bleeding.    Procedure discussed with patient.  Recovery and pain management discussed.  Risks discussed including but not limited to bleeding, rare risk of transfusion, infection, 1% risk of uterine perforation with risks of fluid deficit causing cardiac arrythmia, cerebral swelling and/or need to stop procedure early.  Fluid emboli and rare risk of death discussed.  DVT/PE, rare risk of risk of bowel/bladder/ureteral/vascular injury.  Patient aware if pathology abnormal she may need additional treatment.  All questions answered.       Ob Hx:   Patient's last menstrual period was 06/12/2007.          Sexually active: Yes.   Birth control: PMP Last pap: 02/2018 Last MMG: 11/2018 Tobacco: non smoker  Past Surgical History:  Procedure Laterality Date  . BREAST LUMPECTOMY WITH RADIOACTIVE SEED LOCALIZATION Left 08/02/2014   Procedure: BREAST LUMPECTOMY WITH RADIOACTIVE SEED LOCALIZATION;  Surgeon: Autumn Messing III, MD;  Location: [place];  Service: General;  Laterality: Left;  . COLONOSCOPY    . COMBINED HYSTEROSCOPY DIAGNOSTIC / D&C  05/31/2008  . DILATION AND CURETTAGE OF UTERUS    . UPPER GI ENDOSCOPY      Past Medical History:  Diagnosis Date  . Atypical lobular hyperplasia of left breast   . 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 Osteoporosis   . Thyroid nodule 4/09   right  . Vaginal atrophy   . Wears glasses     Allergies: Ativan [lorazepam], Morphine and related, Penicillins, Amoxicillin, Erythromycin, and Other  Current Outpatient Medications  Medication Sig Dispense Refill  . Calcium Citrate-Vitamin D (CALCIUM + D PO) Take by mouth.    . cholecalciferol (VITAMIN D) 1000 units  tablet Take 1,000 Units by mouth daily.    . Lactobacillus (PROBIOTIC ACIDOPHILUS PO) Take by mouth.    . loratadine (CLARITIN) 10 MG tablet Take 10 mg by mouth daily.    . Multiple Vitamin (MULTIVITAMIN) tablet Take 1 tablet by mouth daily.    Marland Kitchen omeprazole (PRILOSEC) 40 MG capsule Take 40 mg by mouth every morning.    . risedronate (ACTONEL) 35 MG tablet TAKE ONE TABLET BY MOUTH EVERY 7 DAYS WITH WATER ON AN EMPTY STOMACH.  NOTHING BY MOUTH AND DO NOT LIE DOWN FOR THE NEXT 30 MINUTES. 12 tablet 3  . vitamin B-12 (CYANOCOBALAMIN) 500 MCG tablet Take 500 mcg by mouth. 3 times a week     No current facility-administered medications for this visit.    ROS: Pertinent items noted in HPI and remainder of comprehensive ROS otherwise negative.  Exam:    BP 100/68   Pulse 68   Resp 16   Wt 121 lb (54.9 kg)   LMP 06/12/2007   BMI 20.77 kg/m   General appearance: alert and cooperative Head: Normocephalic, without obvious abnormality, atraumatic Neck: no adenopathy, supple, symmetrical, trachea midline and thyroid not enlarged, symmetric, no tenderness/mass/nodules Lungs: clear to auscultation bilaterally Heart: regular rate and rhythm, S1, S2 normal, no murmur, click, rub or gallop Abdomen: soft, non-tender; bowel sounds normal; no masses,  no organomegaly Extremities: extremities normal, atraumatic, no cyanosis or edema Skin: Skin color, texture, turgor normal. No rashes or lesions Lymph nodes: Cervical, supraclavicular, and axillary nodes normal. no inguinal nodes  palpated Neurologic: Grossly normal  Pelvic: no performed today  A: Endometrial mass c/w polyp   P:  Hysteroscopy with polyp resection, D&C planned Pre and post op instruction reviewed Post op pain management reviewed Hysterectomy brochure given for pre and post op instructions.

## 2020-02-24 ENCOUNTER — Encounter (HOSPITAL_BASED_OUTPATIENT_CLINIC_OR_DEPARTMENT_OTHER): Payer: Self-pay | Admitting: Obstetrics & Gynecology

## 2020-02-24 ENCOUNTER — Other Ambulatory Visit: Payer: Self-pay

## 2020-02-24 NOTE — Progress Notes (Signed)
Spoke w/ via phone for pre-op interview---pt Lab needs dos---cbc-               Lab results------none COVID test ------02-26-20 1430 Arrive at -------645 am 03-01-2020 NPO after MN NO Solid Food.  Clear liquids from MN until---545 am then npo Medications to take morning of surgery -----omeprazole (pt wishes to not take claritin) Diabetic medication -----n/a Patient Special Instructions -----none Pre-Op special Istructions -----none Patient verbalized understanding of instructions that were given at this phone interview. Patient denies shortness of breath, chest pain, fever, cough at this phone interview.

## 2020-02-26 ENCOUNTER — Other Ambulatory Visit (HOSPITAL_COMMUNITY)
Admission: RE | Admit: 2020-02-26 | Discharge: 2020-02-26 | Disposition: A | Discharge status: Home / Self Care | Payer: Medicare Other | Source: Ambulatory Visit | Attending: Obstetrics & Gynecology | Admitting: Obstetrics & Gynecology

## 2020-02-26 DIAGNOSIS — Z01812 Encounter for preprocedural laboratory examination: Secondary | ICD-10-CM | POA: Insufficient documentation

## 2020-02-26 DIAGNOSIS — Z20822 Contact with and (suspected) exposure to covid-19: Secondary | ICD-10-CM | POA: Insufficient documentation

## 2020-02-26 LAB — SARS CORONAVIRUS 2 (TAT 6-24 HRS): SARS Coronavirus 2: NEGATIVE

## 2020-03-01 ENCOUNTER — Ambulatory Visit (HOSPITAL_BASED_OUTPATIENT_CLINIC_OR_DEPARTMENT_OTHER): Payer: Medicare Other | Admitting: Anesthesiology

## 2020-03-01 ENCOUNTER — Encounter (HOSPITAL_BASED_OUTPATIENT_CLINIC_OR_DEPARTMENT_OTHER): Payer: Self-pay | Admitting: Obstetrics & Gynecology

## 2020-03-01 ENCOUNTER — Ambulatory Visit (HOSPITAL_BASED_OUTPATIENT_CLINIC_OR_DEPARTMENT_OTHER)
Admission: RE | Admit: 2020-03-01 | Discharge: 2020-03-01 | Disposition: A | Discharge status: Home / Self Care | Payer: Medicare Other | Attending: Obstetrics & Gynecology | Admitting: Obstetrics & Gynecology

## 2020-03-01 ENCOUNTER — Encounter (HOSPITAL_BASED_OUTPATIENT_CLINIC_OR_DEPARTMENT_OTHER)
Admission: RE | Disposition: A | Discharge status: Home / Self Care | Payer: Self-pay | Source: Home / Self Care | Attending: Obstetrics & Gynecology

## 2020-03-01 DIAGNOSIS — Z7983 Long term (current) use of bisphosphonates: Secondary | ICD-10-CM | POA: Insufficient documentation

## 2020-03-01 DIAGNOSIS — Z79899 Other long term (current) drug therapy: Secondary | ICD-10-CM | POA: Diagnosis not present

## 2020-03-01 DIAGNOSIS — Z880 Allergy status to penicillin: Secondary | ICD-10-CM | POA: Diagnosis not present

## 2020-03-01 DIAGNOSIS — N840 Polyp of corpus uteri: Secondary | ICD-10-CM | POA: Insufficient documentation

## 2020-03-01 DIAGNOSIS — Z885 Allergy status to narcotic agent status: Secondary | ICD-10-CM | POA: Diagnosis not present

## 2020-03-01 DIAGNOSIS — N858 Other specified noninflammatory disorders of uterus: Secondary | ICD-10-CM | POA: Diagnosis not present

## 2020-03-01 DIAGNOSIS — M810 Age-related osteoporosis without current pathological fracture: Secondary | ICD-10-CM | POA: Insufficient documentation

## 2020-03-01 DIAGNOSIS — Z8262 Family history of osteoporosis: Secondary | ICD-10-CM | POA: Insufficient documentation

## 2020-03-01 DIAGNOSIS — D259 Leiomyoma of uterus, unspecified: Secondary | ICD-10-CM | POA: Diagnosis not present

## 2020-03-01 DIAGNOSIS — Z881 Allergy status to other antibiotic agents status: Secondary | ICD-10-CM | POA: Insufficient documentation

## 2020-03-01 DIAGNOSIS — K219 Gastro-esophageal reflux disease without esophagitis: Secondary | ICD-10-CM | POA: Diagnosis not present

## 2020-03-01 HISTORY — DX: Polyp of corpus uteri: N84.0

## 2020-03-01 HISTORY — PX: DILATATION & CURETTAGE/HYSTEROSCOPY WITH MYOSURE: SHX6511

## 2020-03-01 LAB — CBC
HCT: 39.3 % (ref 36.0–46.0)
Hemoglobin: 13.2 g/dL (ref 12.0–15.0)
MCH: 32.2 pg (ref 26.0–34.0)
MCHC: 33.6 g/dL (ref 30.0–36.0)
MCV: 95.9 fL (ref 80.0–100.0)
Platelets: 196 10*3/uL (ref 150–400)
RBC: 4.1 MIL/uL (ref 3.87–5.11)
RDW: 12.7 % (ref 11.5–15.5)
WBC: 4.4 10*3/uL (ref 4.0–10.5)
nRBC: 0 % (ref 0.0–0.2)

## 2020-03-01 SURGERY — DILATATION & CURETTAGE/HYSTEROSCOPY WITH MYOSURE
Anesthesia: General | Site: Uterus

## 2020-03-01 MED ORDER — MIDAZOLAM HCL 5 MG/5ML IJ SOLN
INTRAMUSCULAR | Status: DC | PRN
  Administered 2020-03-01: 1 mg via INTRAVENOUS

## 2020-03-01 MED ORDER — KETOROLAC TROMETHAMINE 30 MG/ML IJ SOLN
INTRAMUSCULAR | Status: AC
  Filled 2020-03-01: qty 1

## 2020-03-01 MED ORDER — ONDANSETRON HCL 4 MG/2ML IJ SOLN
INTRAMUSCULAR | Status: DC | PRN
  Administered 2020-03-01: 4 mg via INTRAVENOUS

## 2020-03-01 MED ORDER — FENTANYL CITRATE (PF) 100 MCG/2ML IJ SOLN
INTRAMUSCULAR | Status: AC
  Filled 2020-03-01: qty 2

## 2020-03-01 MED ORDER — LIDOCAINE 2% (20 MG/ML) 5 ML SYRINGE
INTRAMUSCULAR | Status: DC | PRN
  Administered 2020-03-01: 100 mg via INTRAVENOUS

## 2020-03-01 MED ORDER — ACETAMINOPHEN 500 MG PO TABS
ORAL_TABLET | ORAL | Status: AC
  Filled 2020-03-01: qty 2

## 2020-03-01 MED ORDER — FENTANYL CITRATE (PF) 100 MCG/2ML IJ SOLN: 25.0000 ug | INTRAMUSCULAR | Status: DC | PRN

## 2020-03-01 MED ORDER — SODIUM CHLORIDE 0.9 % IR SOLN
Status: DC | PRN
  Administered 2020-03-01: 3000 mL

## 2020-03-01 MED ORDER — POVIDONE-IODINE 10 % EX SWAB: 2.0000 "application " | Freq: Once | CUTANEOUS | Status: DC

## 2020-03-01 MED ORDER — LIDOCAINE-EPINEPHRINE 1 %-1:100000 IJ SOLN
INTRAMUSCULAR | Status: DC | PRN
  Administered 2020-03-01: 10 mL

## 2020-03-01 MED ORDER — MIDAZOLAM HCL 2 MG/2ML IJ SOLN
INTRAMUSCULAR | Status: AC
  Filled 2020-03-01: qty 2

## 2020-03-01 MED ORDER — ONDANSETRON HCL 4 MG/2ML IJ SOLN
INTRAMUSCULAR | Status: AC
  Filled 2020-03-01: qty 2

## 2020-03-01 MED ORDER — PROPOFOL 10 MG/ML IV BOLUS
INTRAVENOUS | Status: DC | PRN
  Administered 2020-03-01: 180 mg via INTRAVENOUS

## 2020-03-01 MED ORDER — HYDROCODONE-ACETAMINOPHEN 5-325 MG PO TABS: 1.0000 | ORAL_TABLET | Freq: Four times a day (QID) | ORAL | 0 refills | Status: DC | PRN

## 2020-03-01 MED ORDER — ONDANSETRON HCL 4 MG/2ML IJ SOLN: 4.0000 mg | Freq: Once | INTRAMUSCULAR | Status: DC | PRN

## 2020-03-01 MED ORDER — DEXAMETHASONE SODIUM PHOSPHATE 10 MG/ML IJ SOLN
INTRAMUSCULAR | Status: AC
  Filled 2020-03-01: qty 1

## 2020-03-01 MED ORDER — FENTANYL CITRATE (PF) 100 MCG/2ML IJ SOLN
INTRAMUSCULAR | Status: DC | PRN
  Administered 2020-03-01: 50 ug via INTRAVENOUS

## 2020-03-01 MED ORDER — LACTATED RINGERS IV SOLN: INTRAVENOUS | Status: DC

## 2020-03-01 MED ORDER — ACETAMINOPHEN 500 MG PO TABS
1000.0000 mg | ORAL_TABLET | Freq: Once | ORAL | Status: AC
  Administered 2020-03-01: 1000 mg via ORAL

## 2020-03-01 MED ORDER — LIDOCAINE 2% (20 MG/ML) 5 ML SYRINGE
INTRAMUSCULAR | Status: AC
  Filled 2020-03-01: qty 5

## 2020-03-01 MED ORDER — PROPOFOL 10 MG/ML IV BOLUS
INTRAVENOUS | Status: AC
  Filled 2020-03-01: qty 20

## 2020-03-01 MED ORDER — DEXAMETHASONE SODIUM PHOSPHATE 10 MG/ML IJ SOLN
INTRAMUSCULAR | Status: DC | PRN
  Administered 2020-03-01: 5 mg via INTRAVENOUS

## 2020-03-01 SURGICAL SUPPLY — 25 items
BIPOLAR CUTTING LOOP 21FR (ELECTRODE)
CANISTER SUCT 3000ML PPV (MISCELLANEOUS) IMPLANT
CATH ROBINSON RED A/P 16FR (CATHETERS) ×3 IMPLANT
COVER WAND RF STERILE (DRAPES) ×3 IMPLANT
DEVICE MYOSURE LITE (MISCELLANEOUS) IMPLANT
DEVICE MYOSURE REACH (MISCELLANEOUS) ×3 IMPLANT
DILATOR CANAL MILEX (MISCELLANEOUS) ×3 IMPLANT
GAUZE 4X4 16PLY RFD (DISPOSABLE) ×3 IMPLANT
GLOVE BIOGEL PI IND STRL 7.0 (GLOVE) ×1 IMPLANT
GLOVE BIOGEL PI INDICATOR 7.0 (GLOVE) ×2
GLOVE ECLIPSE 6.5 STRL STRAW (GLOVE) ×6 IMPLANT
GOWN STRL REUS W/ TWL LRG LVL3 (GOWN DISPOSABLE) ×1 IMPLANT
GOWN STRL REUS W/ TWL XL LVL3 (GOWN DISPOSABLE) ×1 IMPLANT
GOWN STRL REUS W/TWL LRG LVL3 (GOWN DISPOSABLE) ×3
GOWN STRL REUS W/TWL XL LVL3 (GOWN DISPOSABLE) ×3
IV NS IRRIG 3000ML ARTHROMATIC (IV SOLUTION) ×3 IMPLANT
KIT PROCEDURE FLUENT (KITS) ×3 IMPLANT
KIT TURNOVER CYSTO (KITS) ×3 IMPLANT
LOOP CUTTING BIPOLAR 21FR (ELECTRODE) IMPLANT
PACK VAGINAL MINOR WOMEN LF (CUSTOM PROCEDURE TRAY) ×3 IMPLANT
PAD OB MATERNITY 4.3X12.25 (PERSONAL CARE ITEMS) ×3 IMPLANT
SEAL CERVICAL OMNI LOK (ABLATOR) IMPLANT
SEAL ROD LENS SCOPE MYOSURE (ABLATOR) ×3 IMPLANT
TOWEL OR 17X26 10 PK STRL BLUE (TOWEL DISPOSABLE) ×3 IMPLANT
WATER STERILE IRR 500ML POUR (IV SOLUTION) ×3 IMPLANT

## 2020-03-01 NOTE — Discharge Instructions (Addendum)
Post Anesthesia Home Care Instructions  Activity: Get plenty of rest for the remainder of the day. A responsible individual must stay with you for 24 hours following the procedure.  For the next 24 hours, DO NOT: -Drive a car -Paediatric nurse -Drink alcoholic beverages -Take any medication unless instructed by your physician -Make any legal decisions or sign important papers.  Meals: Start with liquid foods such as gelatin or soup. Progress to regular foods as tolerated. Avoid greasy, spicy, heavy foods. If nausea and/or vomiting occur, drink only clear liquids until the nausea and/or vomiting subsides. Call your physician if vomiting continues.  Special Instructions/Symptoms: Your throat may feel dry or sore from the anesthesia or the breathing tube placed in your throat during surgery. If this causes discomfort, gargle with warm salt water. The discomfort should disappear within 24 hours.  If you had a scopolamine patch placed behind your ear for the management of post- operative nausea and/or vomiting:  1. The medication in the patch is effective for 72 hours, after which it should be removed.  Wrap patch in a tissue and discard in the trash. Wash hands thoroughly with soap and water. 2. You may remove the patch earlier than 72 hours if you experience unpleasant side effects which may include dry mouth, dizziness or visual disturbances. 3. Avoid touching the patch. Wash your hands with soap and water after contact with the patch.    Post-surgical Instructions, Outpatient Surgery  You may expect to feel dizzy, weak, and drowsy for as long as 24 hours after receiving the medicine that made you sleep (anesthetic). For the first 24 hours after your surgery:    Do not drive a car, ride a bicycle, participate in physical activities, or take public transportation until you are done taking narcotic pain medicines or as directed by Dr. Sabra Heck.   Do not drink alcohol or take tranquilizers.     Do not take medicine that has not been prescribed by your physicians.   Do not sign important papers or make important decisions while on narcotic pain medicines.   Have a responsible person with you.   PAIN MANAGEMENT   Tylenol 500mg  tablets, 1-2 every 6 hours as needed for pain.  Vicodin 5/325mg .  For more severe pain, take one or two tablets every four to six hours as needed for pain control.  (Remember that narcotic pain medications increase your risk of constipation.  If this becomes a problem, you may take an over the counter stool softener like Colace 100mg  up to four times a day.)  This does contain tylenol so decrease the amount of tylenol you take if you need the prescription pain medication.    DO'S AND DON'T'S  Do not take a tub bath for one week.  You may shower on the first day after your surgery  Do not do any heavy lifting for one to two weeks.  This increases the chance of bleeding.  Do move around as you feel able.  Stairs are fine.  You may begin to exercise again as you feel able.  Do not lift any weights for two weeks.  Do not put anything in the vagina for two weeks--no tampons, intercourse, or douching.    REGULAR MEDIATIONS/VITAMINS:  You may restart all of your regular medications as prescribed.  You may restart all of your vitamins as you normally take them.    PLEASE CALL OR SEEK MEDICAL CARE IF:  You have persistent nausea and vomiting.  You have trouble eating or drinking.   You have an oral temperature above 100.5.   You have constipation that is not helped by adjusting diet or increasing fluid intake. Pain medicines are a common cause of constipation.   You have heavy vaginal bleeding

## 2020-03-01 NOTE — Anesthesia Postprocedure Evaluation (Signed)
Anesthesia Post Note  Patient: Brianna Fuller  Procedure(s) Performed: DILATATION & CURETTAGE/HYSTEROSCOPY WITH MYOSURE (N/A Uterus)     Patient location during evaluation: PACU Anesthesia Type: General Level of consciousness: awake and alert Pain management: pain level controlled Vital Signs Assessment: post-procedure vital signs reviewed and stable Respiratory status: spontaneous breathing, nonlabored ventilation and respiratory function stable Cardiovascular status: blood pressure returned to baseline and stable Postop Assessment: no apparent nausea or vomiting Anesthetic complications: no   No complications documented.  Last Vitals:  Vitals:   03/01/20 1000 03/01/20 1130  BP: 116/85 (!) 144/85  Pulse: 71 (!) 52  Resp: 16 16  Temp:  36.6 C  SpO2:  100%    Last Pain:  Vitals:   03/01/20 1130  TempSrc:   PainSc: 2                  Catalina Gravel

## 2020-03-01 NOTE — Anesthesia Preprocedure Evaluation (Addendum)
Anesthesia Evaluation  Patient identified by MRN, date of birth, ID band Patient awake    Reviewed: Allergy & Precautions, NPO status , Patient's Chart, lab work & pertinent test results  Airway Mallampati: I  TM Distance: >3 FB Neck ROM: Full    Dental  (+) Teeth Intact   Pulmonary neg pulmonary ROS,    Pulmonary exam normal breath sounds clear to auscultation       Cardiovascular negative cardio ROS Normal cardiovascular exam Rhythm:Regular Rate:Normal     Neuro/Psych negative neurological ROS     GI/Hepatic Neg liver ROS, GERD  Medicated,  Endo/Other  negative endocrine ROS  Renal/GU negative Renal ROS     Musculoskeletal negative musculoskeletal ROS (+)   Abdominal   Peds  Hematology negative hematology ROS (+)   Anesthesia Other Findings Day of surgery medications reviewed with the patient.  Reproductive/Obstetrics Endometrial polyp                            Anesthesia Physical Anesthesia Plan  ASA: II  Anesthesia Plan: General   Post-op Pain Management:    Induction: Intravenous  PONV Risk Score and Plan: 4 or greater and Midazolam, Dexamethasone and Ondansetron  Airway Management Planned: LMA  Additional Equipment:   Intra-op Plan:   Post-operative Plan: Extubation in OR  Informed Consent: I have reviewed the patients History and Physical, chart, labs and discussed the procedure including the risks, benefits and alternatives for the proposed anesthesia with the patient or authorized representative who has indicated his/her understanding and acceptance.       Plan Discussed with: CRNA  Anesthesia Plan Comments:         Anesthesia Quick Evaluation

## 2020-03-01 NOTE — H&P (Signed)
Brianna Fuller is an 69 y.o. female G0 MWF with endometrial polyp here for hysteroscopy with polyp resection, D&C.  She has been followed conservatively for about a year partially due to Covid but on ost recent ultrasound, polyp was mildly increased in size and removal was recommended.  Alternatives, risks and benefits have been discussed with pt.  Uterus measured 5.5 x 3.5 x 2.4cm with 93mm endometrial mass with feeder vessel present.    Pertinent Gynecological History: Menses: post-menopausal Bleeding: none Contraception: pmp DES exposure: denies Blood transfusions: none Sexually transmitted diseases: no past history Previous GYN Procedures: none  Last mammogram: normal Date: 11/26/2018 Last pap: normal Date: 9/19 OB History: G0, P0   Menstrual History: Patient's last menstrual period was 06/12/2007.    Past Medical History:  Diagnosis Date  . Atypical lobular hyperplasia of left breast   . Duodenal perforation ([place]) 01/2014  . Endometrial polyp   . Osteoporosis   . Thyroid nodule 09/2007   right  . Vaginal atrophy   . Wears glasses     Past Surgical History:  Procedure Laterality Date  . BREAST LUMPECTOMY WITH RADIOACTIVE SEED LOCALIZATION Left 08/02/2014   Procedure: BREAST LUMPECTOMY WITH RADIOACTIVE SEED LOCALIZATION;  Surgeon: Autumn Messing III, MD;  Location: [facility];  Service: General;  Laterality: Left;  . COLONOSCOPY    . COMBINED HYSTEROSCOPY DIAGNOSTIC / D&C  05/31/2008  . DILATION AND CURETTAGE OF UTERUS    . UPPER GI ENDOSCOPY      Family History  Problem Relation Age of Onset  . Hypertension Father   . Cancer Brother        skin  . Osteoporosis Mother   . Hyperlipidemia Mother   . Atrial fibrillation Mother   . Cancer Paternal Aunt 66       breast cancer     Social History:  reports that she has never smoked. She has never used smokeless tobacco. She reports current alcohol use of about 6.0 - 8.0 standard drinks of alcohol per week.  She reports that she does not use drugs.  Allergies:  Allergies  Allergen Reactions  . Ativan [Lorazepam] Other (See Comments)    hallucinations  . Morphine And Related     BP dropped  . Penicillins     Some forms of penicillin skin rash  . Amoxicillin Rash  . Erythromycin Rash  . Other Rash    Patient cannot remember name of medication but knows it was an antibiotic used to treat UTI erythromucin    Medications Prior to Admission  Medication Sig Dispense Refill Last Dose  . Calcium Citrate-Vitamin D (CALCIUM + D PO) Take by mouth daily. 2 tabs   Past Week at Unknown time  . cholecalciferol (VITAMIN D) 1000 units tablet Take 1,000 Units by mouth daily.   Past Week at Unknown time  . Lactobacillus (PROBIOTIC ACIDOPHILUS PO) Take by mouth.   02/29/2020 at Unknown time  . loratadine (CLARITIN) 10 MG tablet Take 10 mg by mouth daily.   Past Week at Unknown time  . Multiple Vitamin (MULTIVITAMIN) tablet Take 1 tablet by mouth daily.   Past Week at Unknown time  . omeprazole (PRILOSEC) 40 MG capsule Take 40 mg by mouth every morning.   03/01/2020 at 0530  . risedronate (ACTONEL) 35 MG tablet TAKE ONE TABLET BY MOUTH EVERY 7 DAYS WITH WATER ON AN EMPTY STOMACH.  NOTHING BY MOUTH AND DO NOT LIE DOWN FOR THE NEXT 30 MINUTES. 12 tablet 3  Past Week at Unknown time  . vitamin B-12 (CYANOCOBALAMIN) 500 MCG tablet Take 500 mcg by mouth. 3 times a week   Past Week at Unknown time    Review of Systems  All other systems reviewed and are negative.   Blood pressure (!) 143/83, pulse 62, temperature 98.5 F (36.9 C), temperature source Oral, resp. rate 14, height 5\' 4"  (1.626 m), weight 54.9 kg, last menstrual period 06/12/2007, SpO2 100 %. Physical Exam Vitals reviewed.  Constitutional:      Appearance: Normal appearance.  Cardiovascular:     Rate and Rhythm: Normal rate and regular rhythm.  Pulmonary:     Effort: Pulmonary effort is normal.     Breath sounds: Normal breath sounds.  Skin:     General: Skin is warm and dry.  Neurological:     General: No focal deficit present.     Mental Status: She is alert.  Psychiatric:        Mood and Affect: Mood normal.     No results found for this or any previous visit (from the past 24 hour(s)).  No results found.  Assessment/Plan: 69 yo G0 MWF with hx of endometrial polyp here for hysteroscopy, polyp resection and D&C for definitve treatment of endometrial polyp.  Pt here and ready to proceed.  Questions answered.  Brianna Fuller 03/01/2020, 7:10 AM

## 2020-03-01 NOTE — Anesthesia Procedure Notes (Signed)
Procedure Name: LMA Insertion Date/Time: 03/01/2020 9:00 AM Performed by: Rogers Blocker, CRNA Pre-anesthesia Checklist: Patient identified, Emergency Drugs available, Suction available and Patient being monitored Patient Re-evaluated:Patient Re-evaluated prior to induction Oxygen Delivery Method: Circle System Utilized Preoxygenation: Pre-oxygenation with 100% oxygen Induction Type: IV induction Ventilation: Mask ventilation without difficulty LMA: LMA inserted LMA Size: 3.0 Number of attempts: 3 Airway Equipment and Method: Bite block Placement Confirmation: positive ETCO2 Tube secured with: Tape Dental Injury: Teeth and Oropharynx as per pre-operative assessment

## 2020-03-01 NOTE — Transfer of Care (Signed)
Immediate Anesthesia Transfer of Care Note  Patient: Brianna Fuller  Procedure(s) Performed: DILATATION & CURETTAGE/HYSTEROSCOPY WITH MYOSURE (N/A Uterus)  Patient Location: PACU  Anesthesia Type:General  Level of Consciousness: awake, alert  and oriented  Airway & Oxygen Therapy: Patient Spontanous Breathing and Patient connected to nasal cannula oxygen  Post-op Assessment: Report given to RN  Post vital signs: Reviewed and stable  Last Vitals:  Vitals Value Taken Time  BP 122/78 03/01/20 0945  Temp    Pulse 79 03/01/20 0945  Resp 12 03/01/20 0945  SpO2 100 % 03/01/20 0945  Vitals shown include unvalidated device data.  Last Pain:  Vitals:   03/01/20 0705  TempSrc: Oral      Patients Stated Pain Goal: 6 (26/33/35 4562)  Complications: No complications documented.

## 2020-03-01 NOTE — Op Note (Signed)
03/01/2020  9:37 AM  PATIENT:  Brianna Fuller  69 y.o. female  PRE-OPERATIVE DIAGNOSIS:  Endometrial mass/lesion likely polyp  POST-OPERATIVE DIAGNOSIS:  Endometrial mass that is more consistent with submucosal fibroid  PROCEDURE:  Procedure(s): DILATATION & CURETTAGE/HYSTEROSCOPY WITH MYOSURE  SURGEON:  Megan Salon  ASSISTANTS: OR staff   ANESTHESIA:   general  ESTIMATED BLOOD LOSS: 5 mL  BLOOD ADMINISTERED:none   FLUIDS: 200ccLR  UOP: pt voided before going back to OR  SPECIMEN:  Endometrial curetting and resected lesion that appears to be a fibroid  DISPOSITION OF SPECIMEN:  PATHOLOGY  FINDINGS: arcuate cavity shape with lesion in fundal region in very middle of cavity  DESCRIPTION OF OPERATION: Patient was taken to the operating room.  She is placed in the supine position. SCDs were on her lower extremities and functioning properly. General anesthesia with an LMA was administered without difficulty. Dr. Hoy Morn, anesthesia, oversaw case.  Legs were then placed in the [place] in the low lithotomy position. The legs were lifted to the high lithotomy position and the Betadine prep was used on the inner thighs perineum and vagina x3. Patient was draped in a normal standard fashion. Pt voided before going back to the OR so no I&O cath was performed in the operating room.  A bivalve speculum was placed the vagina. The anterior lip of the cervix was grasped with single-tooth tenaculum.  A paracervical block of 1% lidocaine mixed one-to-one with epinephrine (1:100,000 units).  10 cc was used total. The cervix is dilated up to #21 [facility] dilators. The endometrial cavity sounded to 5 cm.   A Myosure diagnostic hysteroscope was obtained. Normal saline was used as a hysteroscopic fluid. The hysteroscope was advanced through the endocervical canal into the endometrial cavity. The tubal ostia were noted bilaterally. Additional findings included whitish lesion in mid region  of fundus, arcuate shaped endometrial cavity.  The Myosure reach device was obtained.  The lesion was resected without difficulty.  The hysteroscope was removed. A #1 toothed curette was used to curette the cavity until rough gritty texture was noted in all quadrants. The fluid deficit was 105 cc. The tenaculum was removed from the anterior lip of the cervix. The speculum was removed from the vagina. The prep was cleansed of the patient's skin. The legs are positioned back in the supine position. Sponge, lap, needle, initially counts were correct x2. Patient was taken to recovery in stable condition.  COUNTS:  YES  PLAN OF CARE: Transfer to PACU

## 2020-03-02 ENCOUNTER — Encounter (HOSPITAL_BASED_OUTPATIENT_CLINIC_OR_DEPARTMENT_OTHER): Payer: Self-pay | Admitting: Obstetrics & Gynecology

## 2020-03-02 LAB — SURGICAL PATHOLOGY

## 2020-03-14 NOTE — Progress Notes (Signed)
Post Operative Visit  Procedure: Dilatation & curettage/hysteroscopy with myosure Days Post-op: 14 days  Subjective: Doing well.  Had minimal spotting and cramping after surgery.  Never took anything for pain.  Pictures and pathology reviewed.    Separately, her most recent MMG had increased breast cancer risk noted.  She has familly hx of breast cancer in her mother.  Pt also has hx of atypical lobular hyperplasia.  She underwent lumpectomy for this.  Lifetime risk is >30%.  Objective: Wt 119 lb (54 kg)   LMP 06/12/2007   BMI 20.43 kg/m   EXAM General: alert and no distress GI: soft, non-tender; bowel sounds normal; no masses,  no organomegaly  Gyn:  NAEFG, vaginal without lesions, cervix closed, uterus non tender Extremities: extremities normal, atraumatic, no cyanosis or edema Vaginal Bleeding: none  Assessment: s/p hysteroscopic resection of polyp and fibroid Increased lifetime risk of breast cancer  Family hx of breast cancer in Paternal aunt  Plan: Pt is interested in proceed with additional breast imaging.  Will precert and proceed.  20 minutes spent with pt in discussion and with exam, reviewing surgical pathology, images, findings as well as discussing breast cancer risk

## 2020-03-15 ENCOUNTER — Ambulatory Visit (INDEPENDENT_AMBULATORY_CARE_PROVIDER_SITE_OTHER): Payer: Medicare Other | Admitting: Obstetrics & Gynecology

## 2020-03-15 ENCOUNTER — Other Ambulatory Visit: Payer: Self-pay

## 2020-03-15 ENCOUNTER — Encounter: Payer: Self-pay | Admitting: Obstetrics & Gynecology

## 2020-03-15 VITALS — BP 112/74 | HR 68 | Resp 16 | Wt 119.0 lb

## 2020-03-15 DIAGNOSIS — Z9189 Other specified personal risk factors, not elsewhere classified: Secondary | ICD-10-CM | POA: Diagnosis not present

## 2020-03-15 DIAGNOSIS — D250 Submucous leiomyoma of uterus: Secondary | ICD-10-CM | POA: Diagnosis not present

## 2020-03-15 DIAGNOSIS — N840 Polyp of corpus uteri: Secondary | ICD-10-CM | POA: Diagnosis not present

## 2020-03-20 DIAGNOSIS — Z9189 Other specified personal risk factors, not elsewhere classified: Secondary | ICD-10-CM | POA: Insufficient documentation

## 2020-03-22 ENCOUNTER — Telehealth: Payer: Self-pay | Admitting: *Deleted

## 2020-03-22 DIAGNOSIS — Z9189 Other specified personal risk factors, not elsewhere classified: Secondary | ICD-10-CM

## 2020-03-22 DIAGNOSIS — N6092 Unspecified benign mammary dysplasia of left breast: Secondary | ICD-10-CM

## 2020-03-22 NOTE — Telephone Encounter (Signed)
Brianna Salon, MD  [name], RN [name],  This pt has hx of breast lobular atypical hyperplasia and family hx of breast cancer in paternal aunt at age 69. She has lifetime risk of 32%.  My risk calculation was lower than the one done with [facility] but it was because I didn't put her in hx of atypical lobular hyperplasia. I only put in lobular hyperplasia. So, she should consider yearly breast MRI. She does want to proceed with this so it is ok to proceed with precert.

## 2020-03-22 NOTE — Telephone Encounter (Signed)
Spoke with patient, advised per Dr. Sabra Heck. Patient agreeable to proceed with breast MRI w/wo contrast at [facility], order placed. Patient is aware [facility] will contact her directly to schedule, once scheduled our office will precert.   Patient verbalizes understanding and is agreeable.   Routing to [facility] [employer].   Encounter closed.

## 2020-03-27 DIAGNOSIS — Z23 Encounter for immunization: Secondary | ICD-10-CM | POA: Diagnosis not present

## 2020-03-30 DIAGNOSIS — D2262 Melanocytic nevi of left upper limb, including shoulder: Secondary | ICD-10-CM | POA: Diagnosis not present

## 2020-03-30 DIAGNOSIS — L821 Other seborrheic keratosis: Secondary | ICD-10-CM | POA: Diagnosis not present

## 2020-03-30 DIAGNOSIS — L720 Epidermal cyst: Secondary | ICD-10-CM | POA: Diagnosis not present

## 2020-03-30 DIAGNOSIS — L723 Sebaceous cyst: Secondary | ICD-10-CM | POA: Diagnosis not present

## 2020-03-30 DIAGNOSIS — D2271 Melanocytic nevi of right lower limb, including hip: Secondary | ICD-10-CM | POA: Diagnosis not present

## 2020-03-30 DIAGNOSIS — D2361 Other benign neoplasm of skin of right upper limb, including shoulder: Secondary | ICD-10-CM | POA: Diagnosis not present

## 2020-03-30 DIAGNOSIS — D2239 Melanocytic nevi of other parts of face: Secondary | ICD-10-CM | POA: Diagnosis not present

## 2020-03-30 DIAGNOSIS — D2272 Melanocytic nevi of left lower limb, including hip: Secondary | ICD-10-CM | POA: Diagnosis not present

## 2020-03-30 DIAGNOSIS — D225 Melanocytic nevi of trunk: Secondary | ICD-10-CM | POA: Diagnosis not present

## 2020-04-04 ENCOUNTER — Ambulatory Visit
Admission: RE | Admit: 2020-04-04 | Discharge: 2020-04-04 | Disposition: A | Discharge status: Home / Self Care | Payer: Medicare Other | Source: Ambulatory Visit | Attending: Obstetrics & Gynecology | Admitting: Obstetrics & Gynecology

## 2020-04-04 ENCOUNTER — Other Ambulatory Visit: Payer: Self-pay

## 2020-04-04 DIAGNOSIS — N6489 Other specified disorders of breast: Secondary | ICD-10-CM | POA: Diagnosis not present

## 2020-04-04 DIAGNOSIS — N6092 Unspecified benign mammary dysplasia of left breast: Secondary | ICD-10-CM

## 2020-04-04 DIAGNOSIS — Z9189 Other specified personal risk factors, not elsewhere classified: Secondary | ICD-10-CM

## 2020-04-04 MED ORDER — GADOBUTROL 1 MMOL/ML IV SOLN
5.0000 mL | Freq: Once | INTRAVENOUS | Status: AC | PRN
  Administered 2020-04-04: 5 mL via INTRAVENOUS

## 2020-04-15 DIAGNOSIS — H269 Unspecified cataract: Secondary | ICD-10-CM | POA: Diagnosis not present

## 2020-04-25 ENCOUNTER — Encounter: Payer: Self-pay | Admitting: Obstetrics & Gynecology

## 2020-07-06 DIAGNOSIS — H5213 Myopia, bilateral: Secondary | ICD-10-CM | POA: Diagnosis not present

## 2020-07-06 DIAGNOSIS — H25043 Posterior subcapsular polar age-related cataract, bilateral: Secondary | ICD-10-CM | POA: Diagnosis not present

## 2020-12-01 DIAGNOSIS — M810 Age-related osteoporosis without current pathological fracture: Secondary | ICD-10-CM | POA: Diagnosis not present

## 2020-12-01 DIAGNOSIS — Z79899 Other long term (current) drug therapy: Secondary | ICD-10-CM | POA: Diagnosis not present

## 2020-12-01 DIAGNOSIS — Z1389 Encounter for screening for other disorder: Secondary | ICD-10-CM | POA: Diagnosis not present

## 2020-12-01 DIAGNOSIS — B351 Tinea unguium: Secondary | ICD-10-CM | POA: Diagnosis not present

## 2020-12-01 DIAGNOSIS — R7301 Impaired fasting glucose: Secondary | ICD-10-CM | POA: Diagnosis not present

## 2020-12-01 DIAGNOSIS — R7309 Other abnormal glucose: Secondary | ICD-10-CM | POA: Diagnosis not present

## 2020-12-01 DIAGNOSIS — L600 Ingrowing nail: Secondary | ICD-10-CM | POA: Diagnosis not present

## 2020-12-01 DIAGNOSIS — Z0000 Encounter for general adult medical examination without abnormal findings: Secondary | ICD-10-CM | POA: Diagnosis not present

## 2020-12-01 DIAGNOSIS — Z23 Encounter for immunization: Secondary | ICD-10-CM | POA: Diagnosis not present

## 2020-12-01 DIAGNOSIS — Z8719 Personal history of other diseases of the digestive system: Secondary | ICD-10-CM | POA: Diagnosis not present

## 2020-12-05 DIAGNOSIS — Z1231 Encounter for screening mammogram for malignant neoplasm of breast: Secondary | ICD-10-CM | POA: Diagnosis not present

## 2020-12-14 ENCOUNTER — Encounter (HOSPITAL_BASED_OUTPATIENT_CLINIC_OR_DEPARTMENT_OTHER): Payer: Self-pay | Admitting: Obstetrics & Gynecology

## 2021-01-11 DIAGNOSIS — H25043 Posterior subcapsular polar age-related cataract, bilateral: Secondary | ICD-10-CM | POA: Diagnosis not present

## 2021-01-18 ENCOUNTER — Other Ambulatory Visit (HOSPITAL_BASED_OUTPATIENT_CLINIC_OR_DEPARTMENT_OTHER): Payer: Self-pay | Admitting: Obstetrics & Gynecology

## 2021-01-27 ENCOUNTER — Ambulatory Visit: Payer: Medicare Other

## 2021-02-16 ENCOUNTER — Encounter (HOSPITAL_BASED_OUTPATIENT_CLINIC_OR_DEPARTMENT_OTHER): Payer: Self-pay | Admitting: Obstetrics & Gynecology

## 2021-02-16 ENCOUNTER — Other Ambulatory Visit (HOSPITAL_COMMUNITY)
Admission: RE | Admit: 2021-02-16 | Discharge: 2021-02-16 | Disposition: A | Discharge status: Home / Self Care | Payer: Medicare Other | Source: Ambulatory Visit | Attending: Obstetrics & Gynecology | Admitting: Obstetrics & Gynecology

## 2021-02-16 ENCOUNTER — Ambulatory Visit (INDEPENDENT_AMBULATORY_CARE_PROVIDER_SITE_OTHER): Payer: Medicare Other | Admitting: Obstetrics & Gynecology

## 2021-02-16 ENCOUNTER — Other Ambulatory Visit: Payer: Self-pay

## 2021-02-16 VITALS — BP 140/77 | HR 61 | Ht 63.0 in | Wt 120.8 lb

## 2021-02-16 DIAGNOSIS — B977 Papillomavirus as the cause of diseases classified elsewhere: Secondary | ICD-10-CM | POA: Insufficient documentation

## 2021-02-16 DIAGNOSIS — N6092 Unspecified benign mammary dysplasia of left breast: Secondary | ICD-10-CM

## 2021-02-16 DIAGNOSIS — D219 Benign neoplasm of connective and other soft tissue, unspecified: Secondary | ICD-10-CM | POA: Diagnosis not present

## 2021-02-16 DIAGNOSIS — Z01419 Encounter for gynecological examination (general) (routine) without abnormal findings: Secondary | ICD-10-CM | POA: Insufficient documentation

## 2021-02-16 DIAGNOSIS — Z9189 Other specified personal risk factors, not elsewhere classified: Secondary | ICD-10-CM | POA: Diagnosis not present

## 2021-02-16 DIAGNOSIS — Z1151 Encounter for screening for human papillomavirus (HPV): Secondary | ICD-10-CM | POA: Diagnosis not present

## 2021-02-16 DIAGNOSIS — M810 Age-related osteoporosis without current pathological fracture: Secondary | ICD-10-CM

## 2021-02-16 DIAGNOSIS — Z124 Encounter for screening for malignant neoplasm of cervix: Secondary | ICD-10-CM | POA: Diagnosis not present

## 2021-02-16 DIAGNOSIS — D251 Intramural leiomyoma of uterus: Secondary | ICD-10-CM | POA: Diagnosis not present

## 2021-02-16 DIAGNOSIS — Z8619 Personal history of other infectious and parasitic diseases: Secondary | ICD-10-CM | POA: Insufficient documentation

## 2021-02-16 MED ORDER — RISEDRONATE SODIUM 35 MG PO TABS: ORAL_TABLET | ORAL | 4 refills | Status: DC

## 2021-02-16 NOTE — Progress Notes (Signed)
70 y.o. [place] Married White or Caucasian female here for breast and pelvic exam.  I am also following her for osteoporosis treatment.  She is on risedronate.  She is taking '700mg'$  calcium and Vit D.  Reports she is having some intermittent RLQ pain.  This feels like possibly gas and it does go away.  She does sometimes have constipation.    Pt has hx of atypical lobular hyperplasia in breast in 2016 and elevated lifetime risk of breast cancer.    Denies vaginal bleeding.  Has not been using VIt E.  Would like some instructions about restarting this.  She will let me know when needs refille  Patient's last menstrual period was 06/12/2007.          Sexually active: Yes.    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 Addison Lank.  Last wellness appt was this summer.  Did blood work at that appt:  yes Vaccines are up to date:  pt aware I don't have pneumonia or Covid vaccination dates Colonoscopy:  02/2012, follow up 10 years MMG:  11/2020, breast MRI 03/2020 BMD:  2021 Last pap smear:  LEEP 2017, h/o CIN 1.   H/o abnormal pap smear:  yes    reports that she has never smoked. She has never used smokeless tobacco. She reports current alcohol use of about 6.0 - 8.0 standard drinks per week. She reports that she does not use drugs.  Past Medical History:  Diagnosis Date   Atypical lobular hyperplasia of left breast    Duodenal perforation ([place]) 01/2014   Endometrial polyp    Osteoporosis    Thyroid nodule 09/2007   right   Vaginal atrophy    Wears glasses     Past Surgical History:  Procedure Laterality Date   BREAST LUMPECTOMY WITH RADIOACTIVE SEED LOCALIZATION Left 08/02/2014   Procedure: BREAST LUMPECTOMY WITH RADIOACTIVE SEED LOCALIZATION;  Surgeon: Autumn Messing III, MD;  Location: [facility];  Service: General;  Laterality: Left;   COLONOSCOPY     COMBINED HYSTEROSCOPY DIAGNOSTIC / D&C  05/31/2008   DILATATION & CURETTAGE/HYSTEROSCOPY WITH MYOSURE N/A  03/01/2020   Procedure: [place];  Surgeon: Megan Salon, MD;  Location: [place];  Service: Gynecology;  Laterality: N/A;  endometrial polyp   DILATION AND CURETTAGE OF UTERUS     UPPER GI ENDOSCOPY      Current Outpatient Medications  Medication Sig Dispense Refill   Calcium Citrate-Vitamin D (CALCIUM + D PO) Take by mouth daily. 2 tabs     cholecalciferol (VITAMIN D) 1000 units tablet Take 1,000 Units by mouth daily.     Lactobacillus (PROBIOTIC ACIDOPHILUS PO) Take by mouth.     loratadine (CLARITIN) 10 MG tablet Take 10 mg by mouth daily.     Multiple Vitamin (MULTIVITAMIN) tablet Take 1 tablet by mouth daily.     omeprazole (PRILOSEC) 40 MG capsule Take 40 mg by mouth every morning.     risedronate (ACTONEL) 35 MG tablet TAKE ONE TABLET BY MOUTH EVERY 7 DAYS WITH WATER ON AN EMPTY STOMACH. NOTHING BY MOUTH AND DO NOT LIE DOWN FOR 30 MINUTES AFTER. 12 tablet 0   vitamin B-12 (CYANOCOBALAMIN) 500 MCG tablet Take 500 mcg by mouth. 3 times a week     No current facility-administered medications for this visit.    Family History  Problem Relation Age of Onset   Hypertension Father    Cancer  Brother        skin   Osteoporosis Mother    Hyperlipidemia Mother    Atrial fibrillation Mother    Cancer Paternal Aunt 9       breast cancer     Review of Systems  All other systems reviewed and are negative.  Exam:   BP 140/77 (BP Location: Right Arm, Patient Position: Sitting, Cuff Size: Small)   Pulse 61   Ht '5\' 3"'$  (1.6 m)   Wt 120 lb 12.8 oz (54.8 kg)   LMP 06/12/2007   BMI 21.40 kg/m   Height: '5\' 3"'$  (160 cm)  General appearance: alert, cooperative and appears stated age Breasts: normal appearance, no masses or tenderness Abdomen: soft, non-tender; bowel sounds normal; no masses,  no organomegaly Lymph nodes: Cervical, supraclavicular, and axillary nodes normal.  No abnormal inguinal nodes palpated Neurologic:  Grossly normal  Pelvic: External genitalia:  no lesions              Urethra:  normal appearing urethra with no masses, tenderness or lesions              Bartholins and Skenes: normal                 Vagina: normal appearing vagina with atrophic changes and no discharge, no lesions              Cervix: no lesions              Pap taken: Yes.   Bimanual Exam:  Uterus:  normal size, contour, position, consistency, mobility, non-tender              Adnexa: normal adnexa and no mass, fullness, tenderness               Rectovaginal: Confirms               Anus:  normal sphincter tone, no lesions  Chaperone, Octaviano Batty, CMA, was present for exam.  Assessment/Plan: 1. GYN exam for high-risk Medicare patient - pap and HR HPV obtained today - colonoscopy due next year - plan BMD next year - Lab work done with Dr. 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 Increased risk of breast cancer - will plan breat MRI later this year  4. Intramural leiomyoma of uterus - plan PUS.  Pt will return for this.  5. Age-related osteoporosis without current pathological fracture - plan BMD next year - risedronate (ACTONEL) 35 MG tablet; TAKE ONE TABLET BY MOUTH EVERY 7 DAYS WITH WATER ON AN EMPTY STOMACH. NOTHING BY MOUTH AND DO NOT LIE DOWN FOR 30 MINUTES AFTER.  Dispense: 12 tablet; Refill: 4  6. Atypical lobular hyperplasia of left breast  7. Fibroid - US PELVIS TRANSVAGINAL NON-OB (TV ONLY); Future

## 2021-02-21 LAB — CYTOLOGY - PAP
Comment: NEGATIVE
Diagnosis: NEGATIVE
High risk HPV: NEGATIVE

## 2021-03-08 ENCOUNTER — Ambulatory Visit (INDEPENDENT_AMBULATORY_CARE_PROVIDER_SITE_OTHER): Payer: Medicare Other

## 2021-03-08 ENCOUNTER — Ambulatory Visit (HOSPITAL_BASED_OUTPATIENT_CLINIC_OR_DEPARTMENT_OTHER): Payer: Medicare Other | Admitting: Obstetrics & Gynecology

## 2021-03-08 ENCOUNTER — Other Ambulatory Visit: Payer: Self-pay

## 2021-03-08 DIAGNOSIS — D219 Benign neoplasm of connective and other soft tissue, unspecified: Secondary | ICD-10-CM | POA: Diagnosis not present

## 2021-03-10 ENCOUNTER — Other Ambulatory Visit: Payer: Self-pay

## 2021-03-22 ENCOUNTER — Other Ambulatory Visit (HOSPITAL_BASED_OUTPATIENT_CLINIC_OR_DEPARTMENT_OTHER): Payer: Self-pay | Admitting: Obstetrics & Gynecology

## 2021-03-22 DIAGNOSIS — Z9189 Other specified personal risk factors, not elsewhere classified: Secondary | ICD-10-CM

## 2021-04-07 DIAGNOSIS — L821 Other seborrheic keratosis: Secondary | ICD-10-CM | POA: Diagnosis not present

## 2021-04-07 DIAGNOSIS — D225 Melanocytic nevi of trunk: Secondary | ICD-10-CM | POA: Diagnosis not present

## 2021-04-07 DIAGNOSIS — D2271 Melanocytic nevi of right lower limb, including hip: Secondary | ICD-10-CM | POA: Diagnosis not present

## 2021-04-07 DIAGNOSIS — D2272 Melanocytic nevi of left lower limb, including hip: Secondary | ICD-10-CM | POA: Diagnosis not present

## 2021-04-09 ENCOUNTER — Other Ambulatory Visit: Payer: Self-pay

## 2021-04-09 ENCOUNTER — Ambulatory Visit
Admission: RE | Admit: 2021-04-09 | Discharge: 2021-04-09 | Disposition: A | Discharge status: Home / Self Care | Payer: Medicare Other | Source: Ambulatory Visit | Attending: Obstetrics & Gynecology | Admitting: Obstetrics & Gynecology

## 2021-04-09 DIAGNOSIS — Z9189 Other specified personal risk factors, not elsewhere classified: Secondary | ICD-10-CM

## 2021-04-09 DIAGNOSIS — Z803 Family history of malignant neoplasm of breast: Secondary | ICD-10-CM | POA: Diagnosis not present

## 2021-04-09 MED ORDER — GADOBUTROL 1 MMOL/ML IV SOLN
6.0000 mL | Freq: Once | INTRAVENOUS | Status: AC | PRN
  Administered 2021-04-09: 6 mL via INTRAVENOUS

## 2021-04-13 DIAGNOSIS — Z23 Encounter for immunization: Secondary | ICD-10-CM | POA: Diagnosis not present

## 2021-05-03 DIAGNOSIS — H906 Mixed conductive and sensorineural hearing loss, bilateral: Secondary | ICD-10-CM | POA: Diagnosis not present

## 2021-05-21 IMAGING — MR MR BREAST BILAT WO/W CM
8 of 12 series · 33 of 48 positions shown · IV contrast (5ml gadavist)
Comparison: No prior MRI available for comparison. Correlation made
with prior mammograms.

CLINICAL DATA: 69-year-old female presenting for high risk
screening MRI due to family history of breast cancer, and personal
history of atypical lobular hyperplasia of the right breast which
was excised 08/02/2014 and dense breast tissue. Per her physician's
note, her lifetime risk is estimated to be greater than 30%.

LABS:  None.
EXAM:
BILATERAL BREAST MRI WITH AND WITHOUT CONTRAST
TECHNIQUE: Multiplanar, multisequence MR images of both breasts were obtained
prior to and following the intravenous administration of 5 ml of
Gadavist

[Series 2: t2_tirm_tra ipat (a-p) · axial · 3.0mm · 0.70mm/px · 1 of 56 slices shown]
[im 1/56]
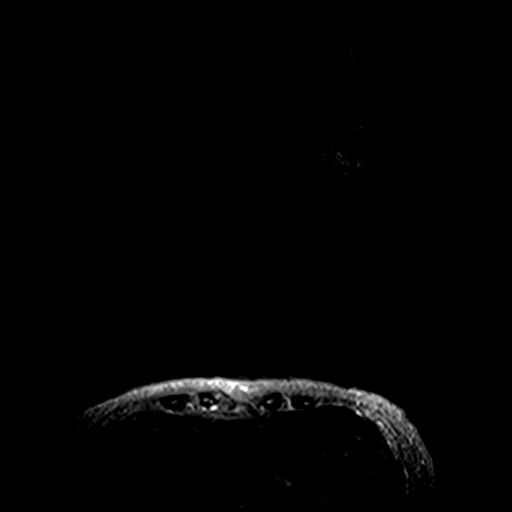

[Series 3: fl3d pre-cm no · axial · non-contrast · 1.2mm · 0.94mm/px · z∈[-60,+131]mm · 5 of 160 slices shown]
[im 1/160]
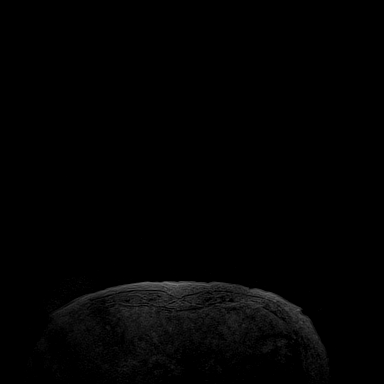
[im 40/160]
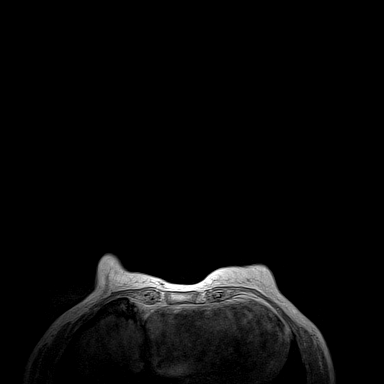
[im 80/160]
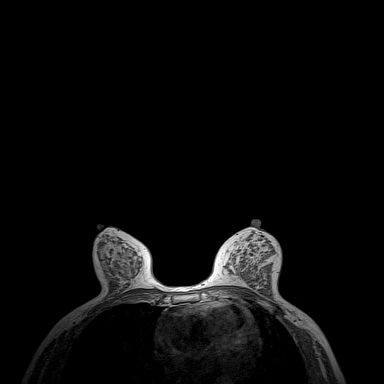
[im 120/160]
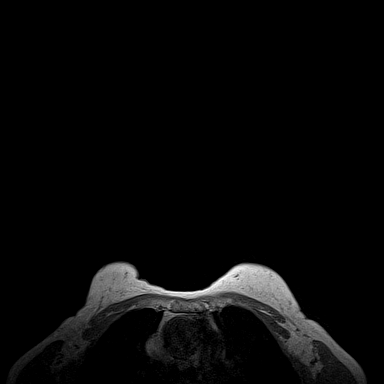
[im 160/160]
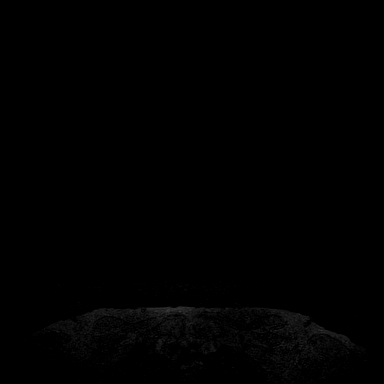

[Series 4: fl3d pre-cm · axial · non-contrast · 1.2mm · 0.94mm/px · z∈[-50,+121]mm · 5 of 144 slices shown]
[im 1/144]
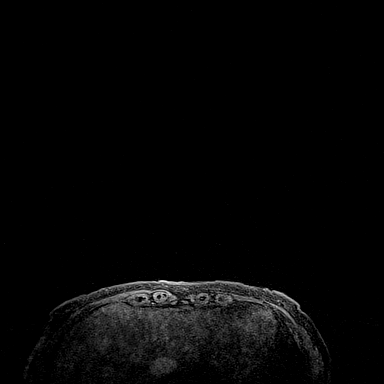
[im 36/144]
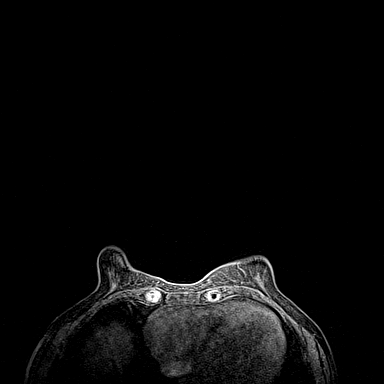
[im 72/144]
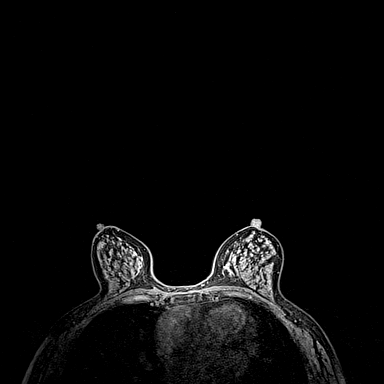
[im 108/144]
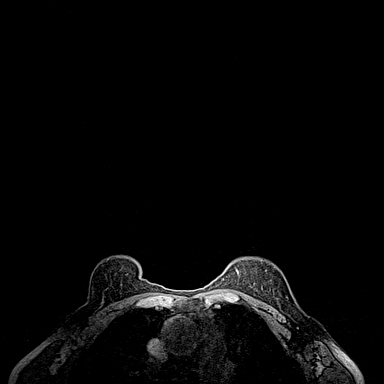
[im 144/144]
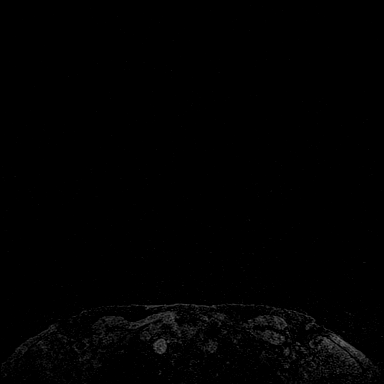

[Series 5: fl3d post-cm 20 · axial · 1.2mm · 0.94mm/px · z∈[-50,+121]mm · 5 of 144 slices shown (1 of 3)]
[im 1/144]
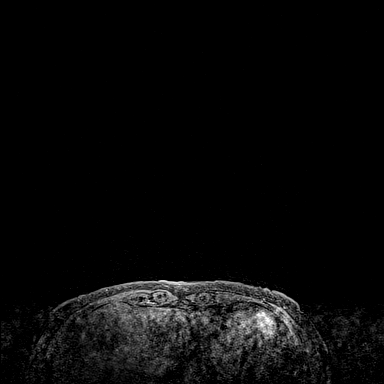
[im 36/144]
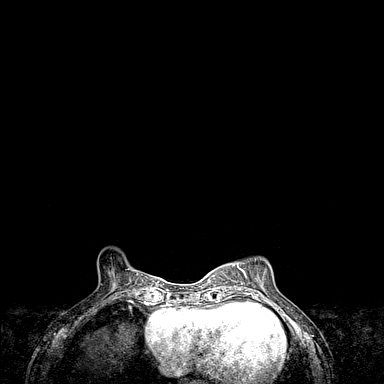
[im 72/144]
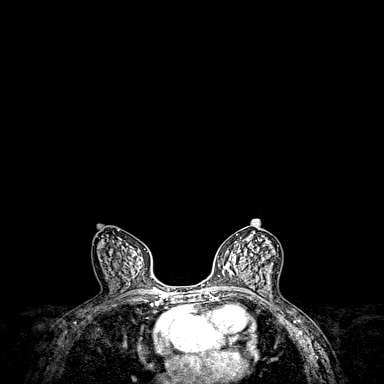
[im 108/144]
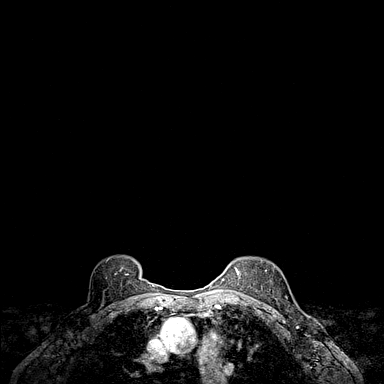
[im 144/144]
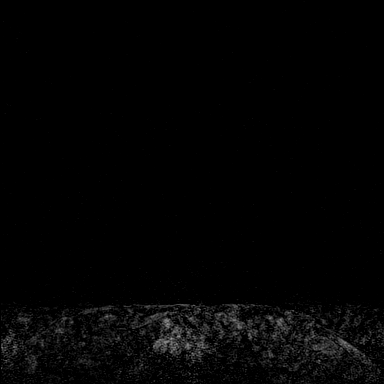

[Series 6: fl3d post-cm 20 · axial · 1.2mm · 0.94mm/px · z∈[-50,+121]mm · 5 of 144 slices shown (2 of 3)]
[im 1/144]
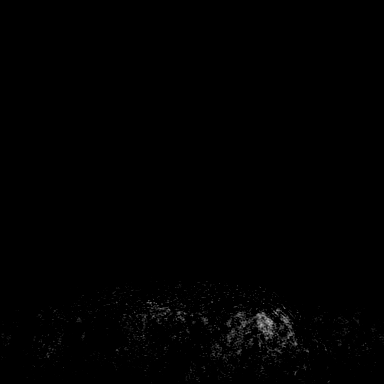
[im 36/144]
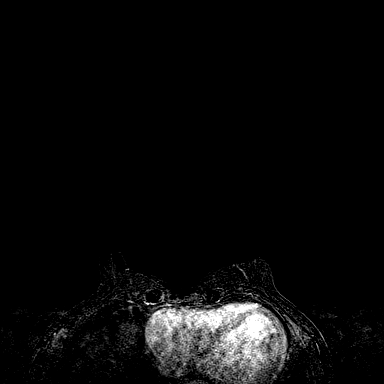
[im 72/144]
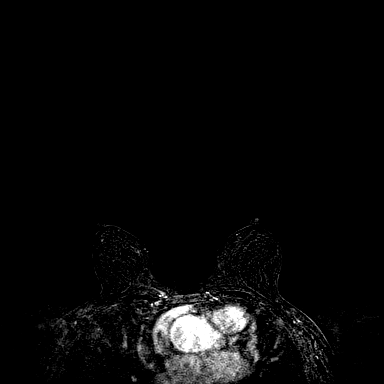
[im 108/144]
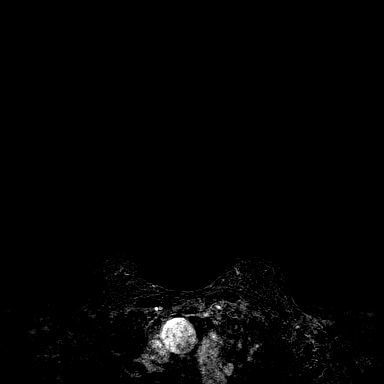
[im 144/144]
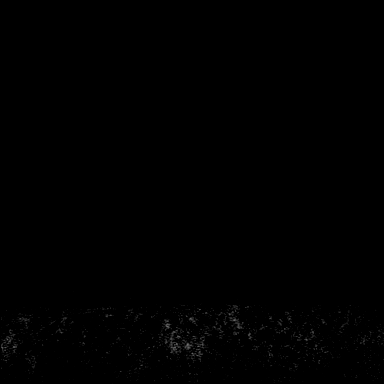

[Series 7: fl3d post-cm 20 · axial · 172.8mm · 0.94mm/px · 1 of 1 slices shown (3 of 3)]
[im 1/1]
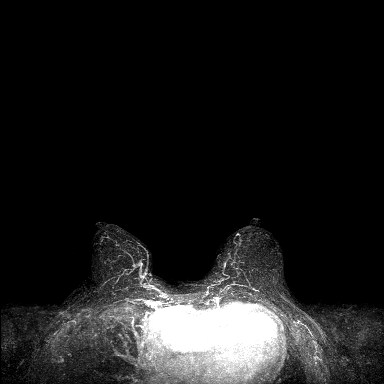

[Series 8: fl3d post-cm 3min · axial · 1.2mm · 0.94mm/px · z∈[-50,+121]mm · 6 of 144 slices shown]
[im 1/144]
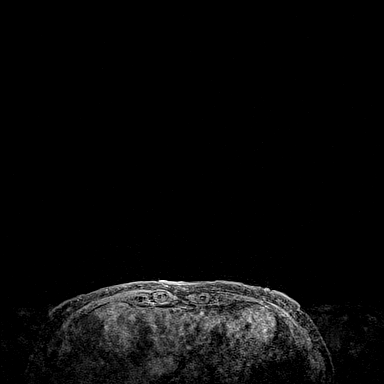
[im 29/144]
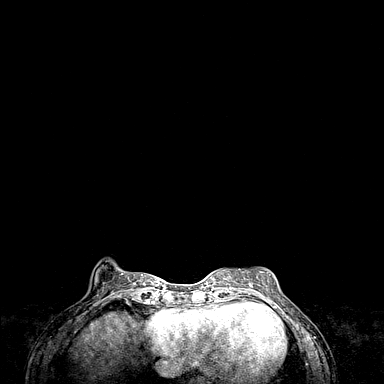
[im 58/144]
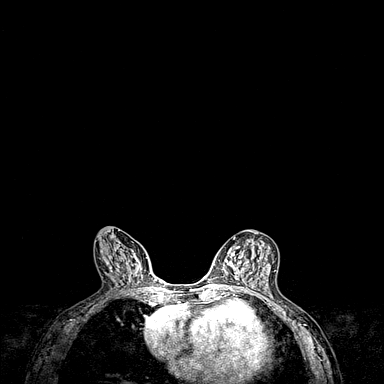
[im 86/144]
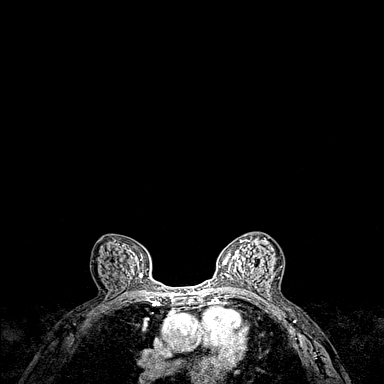
[im 115/144]
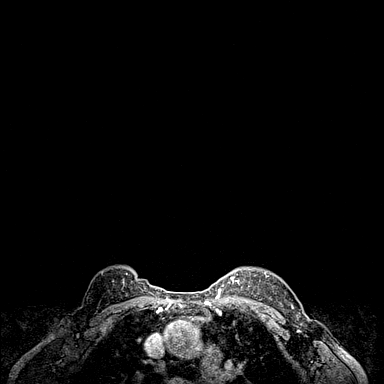
[im 144/144]
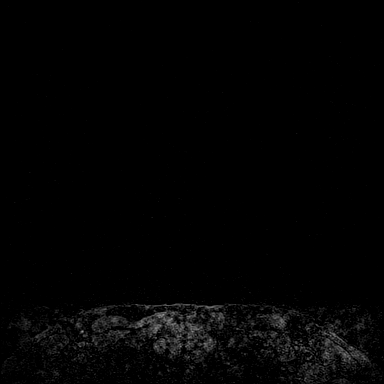

[Series 9: fl3d post-cm 3min_sub · axial · 1.2mm · 0.94mm/px · z∈[-50,+87]mm · 5 of 144 slices shown]
[im 1/144]
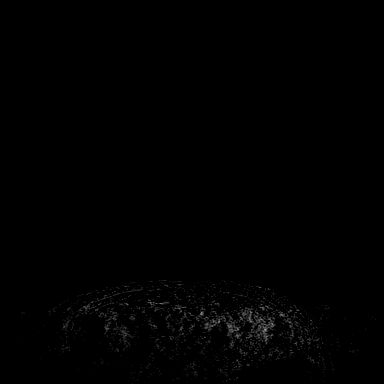
[im 29/144]
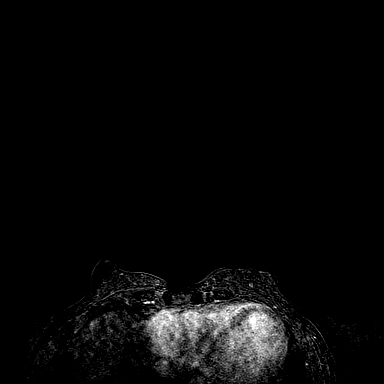
[im 58/144]
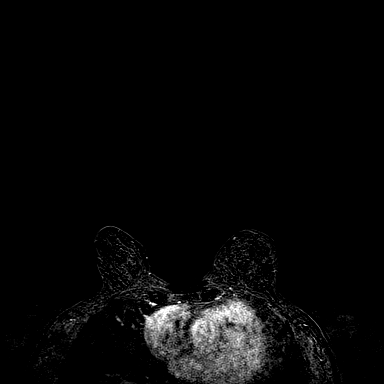
[im 86/144]
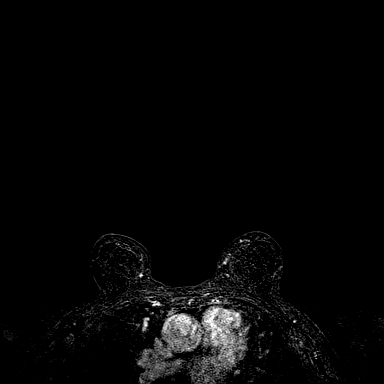
[im 115/144]
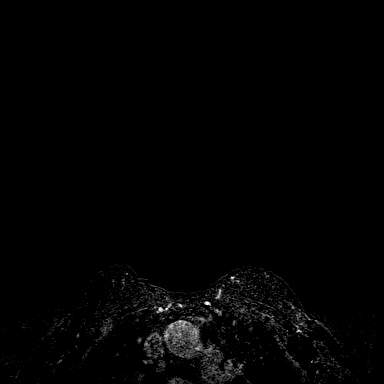

[33 of 48 positions shown; findings below may reference images not displayed]

Three-dimensional MR images were rendered by post-processing of the
original MR data on an independent workstation. The
three-dimensional MR images were interpreted, and findings are
reported in the following complete MRI report for this study. Three
dimensional images were evaluated at the independent interpreting
workstation using the DynaCAD thin client.
FINDINGS: Breast composition: c. Heterogeneous fibroglandular tissue.

Background parenchymal enhancement: Minimal

Right breast: No mass or abnormal enhancement.

Left breast: No mass or abnormal enhancement.

Lymph nodes: No abnormal appearing lymph nodes.

Ancillary findings:  None.
IMPRESSION: No evidence of malignancy in either breast.

RECOMMENDATION:
1.  Annual screening mammography due in Monday November, 2020.

2.  High risk screening MRI recommended in 1 year.

BI-RADS CATEGORY  1: Negative.

## 2021-06-24 DIAGNOSIS — R059 Cough, unspecified: Secondary | ICD-10-CM | POA: Diagnosis not present

## 2021-06-24 DIAGNOSIS — R5383 Other fatigue: Secondary | ICD-10-CM | POA: Diagnosis not present

## 2021-06-24 DIAGNOSIS — R0981 Nasal congestion: Secondary | ICD-10-CM | POA: Diagnosis not present

## 2021-06-24 DIAGNOSIS — U071 COVID-19: Secondary | ICD-10-CM | POA: Diagnosis not present

## 2021-08-03 DIAGNOSIS — Z20822 Contact with and (suspected) exposure to covid-19: Secondary | ICD-10-CM | POA: Diagnosis not present

## 2021-08-29 DIAGNOSIS — Z1211 Encounter for screening for malignant neoplasm of colon: Secondary | ICD-10-CM | POA: Diagnosis not present

## 2021-08-29 DIAGNOSIS — K219 Gastro-esophageal reflux disease without esophagitis: Secondary | ICD-10-CM | POA: Diagnosis not present

## 2021-08-29 DIAGNOSIS — K5730 Diverticulosis of large intestine without perforation or abscess without bleeding: Secondary | ICD-10-CM | POA: Diagnosis not present

## 2021-10-20 DIAGNOSIS — H25043 Posterior subcapsular polar age-related cataract, bilateral: Secondary | ICD-10-CM | POA: Diagnosis not present

## 2021-12-07 DIAGNOSIS — Z8719 Personal history of other diseases of the digestive system: Secondary | ICD-10-CM | POA: Diagnosis not present

## 2021-12-07 DIAGNOSIS — Z0000 Encounter for general adult medical examination without abnormal findings: Secondary | ICD-10-CM | POA: Diagnosis not present

## 2021-12-07 DIAGNOSIS — Z79899 Other long term (current) drug therapy: Secondary | ICD-10-CM | POA: Diagnosis not present

## 2021-12-07 DIAGNOSIS — Z1331 Encounter for screening for depression: Secondary | ICD-10-CM | POA: Diagnosis not present

## 2021-12-07 DIAGNOSIS — M810 Age-related osteoporosis without current pathological fracture: Secondary | ICD-10-CM | POA: Diagnosis not present

## 2021-12-07 DIAGNOSIS — Z1231 Encounter for screening mammogram for malignant neoplasm of breast: Secondary | ICD-10-CM | POA: Diagnosis not present

## 2021-12-18 ENCOUNTER — Encounter (HOSPITAL_BASED_OUTPATIENT_CLINIC_OR_DEPARTMENT_OTHER): Payer: Self-pay | Admitting: *Deleted

## 2022-01-04 DIAGNOSIS — M810 Age-related osteoporosis without current pathological fracture: Secondary | ICD-10-CM | POA: Diagnosis not present

## 2022-01-04 DIAGNOSIS — Z780 Asymptomatic menopausal state: Secondary | ICD-10-CM | POA: Diagnosis not present

## 2022-01-04 DIAGNOSIS — M85852 Other specified disorders of bone density and structure, left thigh: Secondary | ICD-10-CM | POA: Diagnosis not present

## 2022-01-08 ENCOUNTER — Encounter (HOSPITAL_BASED_OUTPATIENT_CLINIC_OR_DEPARTMENT_OTHER): Payer: Self-pay | Admitting: *Deleted

## 2022-02-14 DIAGNOSIS — H2513 Age-related nuclear cataract, bilateral: Secondary | ICD-10-CM | POA: Diagnosis not present

## 2022-02-14 DIAGNOSIS — Q120 Congenital cataract: Secondary | ICD-10-CM | POA: Diagnosis not present

## 2022-02-15 ENCOUNTER — Telehealth (INDEPENDENT_AMBULATORY_CARE_PROVIDER_SITE_OTHER): Payer: Medicare Other | Admitting: Obstetrics & Gynecology

## 2022-02-15 DIAGNOSIS — M810 Age-related osteoporosis without current pathological fracture: Secondary | ICD-10-CM | POA: Diagnosis not present

## 2022-02-18 ENCOUNTER — Encounter (HOSPITAL_BASED_OUTPATIENT_CLINIC_OR_DEPARTMENT_OTHER): Payer: Self-pay | Admitting: Obstetrics & Gynecology

## 2022-02-18 NOTE — Progress Notes (Addendum)
Virtual Visit via Video Note  I connected with Brianna Fuller on 02/28/22 at  4:00 PM EDT by a video enabled telemedicine application and verified that I am speaking with the correct person using two identifiers.  Location: Patient: home Provider: office   I discussed the limitations of evaluation and management by telemedicine and the availability of in person appointments. The patient expressed understanding and agreed to proceed.  Subjective:    78 yrs Married Caucasian G0P0000  female here to discuss recent BMD obtained osteoporosis in spine with t score of -3.1 and right femur with t score -2.8.  this BMD was done 01/08/2022.  Pt has been on actonel without issues for about three years.   With BMD two years ago on 12/22/2019, spine t score was -2.9.  So, this has worsened.  However, this was an improvement over the one from 2020.  Since BMD seems to have not improved any more, feel pt should consider another treatment.  Other bisphosphonates discussed including Reclast.  Then prolia discussed.  Pt has a family member or two on this so has some familiarity.  Would like to consider this option.  Risks/side effects discussed.  Osteoporosis Risk Factors  Nonmodifiable Personal Hx of fracture as an adult: no Caucasian race: no Dementia: no Poor health/frailty: no   Potentially modifiable: Tobacco use: no Low body weight (<127 lbs): no Estrogen deficiency with either early menopause (age <45) or bilateral ovariectomy: no Low calcium intake (lifelong): no Alcohol use more than 2 drinks per day: no Recurrent falls: no Inadequate physical activity: no  Current calcium and Vit D intake: '1200mg'$  with Vit d  Past Medical History:  Diagnosis Date   Atypical lobular hyperplasia of left breast    Duodenal perforation ([place]) 01/2014   Endometrial polyp    Osteoporosis    Thyroid nodule 09/2007   right   Vaginal atrophy    Wears glasses    Current Outpatient Medications on File Prior to  Visit  Medication Sig Dispense Refill   Calcium Citrate-Vitamin D (CALCIUM + D PO) Take by mouth daily. 2 tabs     cholecalciferol (VITAMIN D) 1000 units tablet Take 1,000 Units by mouth daily.     Lactobacillus (PROBIOTIC ACIDOPHILUS PO) Take by mouth.     loratadine (CLARITIN) 10 MG tablet Take 10 mg by mouth daily.     Multiple Vitamin (MULTIVITAMIN) tablet Take 1 tablet by mouth daily.     omeprazole (PRILOSEC) 40 MG capsule Take 40 mg by mouth every morning.     risedronate (ACTONEL) 35 MG tablet TAKE ONE TABLET BY MOUTH EVERY 7 DAYS WITH WATER ON AN EMPTY STOMACH. NOTHING BY MOUTH AND DO NOT LIE DOWN FOR 30 MINUTES AFTER. 12 tablet 4   vitamin B-12 (CYANOCOBALAMIN) 500 MCG tablet Take 500 mcg by mouth. 3 times a week     No current facility-administered medications on file prior to visit.   Allergies  Allergen Reactions   Ativan [Lorazepam] Other (See Comments)    hallucinations   Morphine And Related     BP dropped   Penicillins     Some forms of penicillin skin rash   Amoxicillin Rash   Erythromycin Rash   Other Rash    Patient cannot remember name of medication but knows it was an antibiotic used to treat UTI erythromucin    Review of Systems Review of systems not obtained due to patient factors.     Objective:   PHYSICAL EXAM LMP  06/12/2007  General appearance: alert and no distress  Imaging Bone Density: Spine T Score: -3.1, Hip T Score: -2.8   Done on 12/31/2021 FRAX score:  not calculated due to osteoporosis                                  Assessment:   Osteoporosis with T score -3.1   Plan:   1.  Patient counseled in adequate calcium and vitamin D exposure.  Calcium - 500 - 1000 mg elemental calcium/day in divided doses  Vitamin D - 800 IU/day  Told not to take at same time as bisphosphonate. 2.  Exercise recommended at least 30 minutes 3 times per week.  3.  Recommendation to avoid heavy ETOH use.  4.  Pharmacologic therapy therapy below discussed  including risks and benefits:   Bisphosphonate IV (Reclast)  Prolia subcutaneous  5.  Repeat bone density in 2 years.     Total time with pt:  24 minutes

## 2022-03-07 DIAGNOSIS — Z1211 Encounter for screening for malignant neoplasm of colon: Secondary | ICD-10-CM | POA: Diagnosis not present

## 2022-03-07 DIAGNOSIS — K5730 Diverticulosis of large intestine without perforation or abscess without bleeding: Secondary | ICD-10-CM | POA: Diagnosis not present

## 2022-03-13 ENCOUNTER — Telehealth (HOSPITAL_BASED_OUTPATIENT_CLINIC_OR_DEPARTMENT_OTHER): Payer: Self-pay | Admitting: *Deleted

## 2022-03-13 NOTE — Telephone Encounter (Signed)
Patient called and said please call her back she will be waiting .

## 2022-03-14 ENCOUNTER — Encounter (HOSPITAL_BASED_OUTPATIENT_CLINIC_OR_DEPARTMENT_OTHER): Payer: Self-pay | Admitting: Obstetrics & Gynecology

## 2022-03-14 ENCOUNTER — Other Ambulatory Visit (HOSPITAL_COMMUNITY)
Admission: RE | Admit: 2022-03-14 | Discharge: 2022-03-14 | Disposition: A | Discharge status: Home / Self Care | Payer: Medicare Other | Source: Ambulatory Visit | Attending: Obstetrics & Gynecology | Admitting: Obstetrics & Gynecology

## 2022-03-14 ENCOUNTER — Ambulatory Visit (INDEPENDENT_AMBULATORY_CARE_PROVIDER_SITE_OTHER): Payer: Medicare Other | Admitting: Obstetrics & Gynecology

## 2022-03-14 VITALS — BP 116/74 | HR 74 | Ht 64.0 in | Wt 117.4 lb

## 2022-03-14 DIAGNOSIS — N840 Polyp of corpus uteri: Secondary | ICD-10-CM | POA: Diagnosis not present

## 2022-03-14 DIAGNOSIS — N9089 Other specified noninflammatory disorders of vulva and perineum: Secondary | ICD-10-CM | POA: Diagnosis not present

## 2022-03-14 DIAGNOSIS — M810 Age-related osteoporosis without current pathological fracture: Secondary | ICD-10-CM

## 2022-03-14 DIAGNOSIS — B977 Papillomavirus as the cause of diseases classified elsewhere: Secondary | ICD-10-CM | POA: Diagnosis not present

## 2022-03-14 DIAGNOSIS — N6092 Unspecified benign mammary dysplasia of left breast: Secondary | ICD-10-CM

## 2022-03-14 DIAGNOSIS — Z9189 Other specified personal risk factors, not elsewhere classified: Secondary | ICD-10-CM | POA: Diagnosis not present

## 2022-03-14 MED ORDER — VALACYCLOVIR HCL 1 G PO TABS: 1000.0000 mg | ORAL_TABLET | Freq: Two times a day (BID) | ORAL | 0 refills | Status: DC

## 2022-03-14 NOTE — Progress Notes (Signed)
71 y.o. G0P0000 Married White or Caucasian female here for breast and pelvic exam.  I am also following her for osteoporosis.  She is going to start prolia next week.  Needs calcium level today.  She is taking calcium supplementation and Vit D.    Denies vaginal bleeding.  H/o HR HPV.  Pap last year was negative with neg HR HPV.    Patient's last menstrual period was 06/12/2007.          Sexually active: Yes.     Health Maintenance: PCP:  Dr. Addison Lank.  Last wellness appt was this summer.  Did blood work at that appt:  yes Vaccines are up to date:  Tdap due.  Reviewed other vaccines with pt. Colonoscopy:  2023, just done with Dr. Collene Mares MMG:  12/07/2021 Negative BMD:  01/04/2022 Needs to discuss different medication options Last pap smear:  02/16/2021 Negative.   H/o abnormal pap smear:  yes.  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 She has never used smokeless tobacco. She reports current alcohol use of about 6.0 - 8.0 standard drinks of alcohol per week. She reports that she does not use drugs.  Past Medical History:  Diagnosis Date   Atypical lobular hyperplasia of left breast    Duodenal perforation ([place]) 01/2014   Endometrial polyp    Osteoporosis    Thyroid nodule 09/2007   right   Vaginal atrophy    Wears glasses     Past Surgical History:  Procedure Laterality Date   BREAST LUMPECTOMY WITH RADIOACTIVE SEED LOCALIZATION Left 08/02/2014   Procedure: BREAST LUMPECTOMY WITH RADIOACTIVE SEED LOCALIZATION;  Surgeon: Autumn Messing III, MD;  Location: [facility];  Service: General;  Laterality: Left;   COLONOSCOPY     COMBINED HYSTEROSCOPY DIAGNOSTIC / D&C  05/31/2008   DILATATION & CURETTAGE/HYSTEROSCOPY WITH MYOSURE N/A 03/01/2020   Procedure: [place];  Surgeon: Megan Salon, MD;  Location: [facility];  Service: Gynecology;  Laterality: N/A;  endometrial polyp   DILATION  AND CURETTAGE OF UTERUS     UPPER GI ENDOSCOPY      Current Outpatient Medications  Medication Sig Dispense Refill   Calcium Citrate-Vitamin D (CALCIUM + D PO) Take by mouth daily. 2 tabs     cholecalciferol (VITAMIN D) 1000 units tablet Take 1,000 Units by mouth daily.     Lactobacillus (PROBIOTIC ACIDOPHILUS PO) Take by mouth.     loratadine (CLARITIN) 10 MG tablet Take 10 mg by mouth daily.     Multiple Vitamin (MULTIVITAMIN) tablet Take 1 tablet by mouth daily.     omeprazole (PRILOSEC) 40 MG capsule Take 40 mg by mouth every morning.     risedronate (ACTONEL) 35 MG tablet TAKE ONE TABLET BY MOUTH EVERY 7 DAYS WITH WATER ON AN EMPTY STOMACH. NOTHING BY MOUTH AND DO NOT LIE DOWN FOR 30 MINUTES AFTER. 12 tablet 4   vitamin B-12 (CYANOCOBALAMIN) 500 MCG tablet Take 500 mcg by mouth. 3 times a week     No current facility-administered medications for this visit.    Family History  Problem Relation Age of Onset   Hypertension Father    Cancer Brother        skin   Osteoporosis Mother    Hyperlipidemia Mother    Atrial fibrillation Mother    Cancer Paternal Aunt 30       breast cancer  Review of Systems  Constitutional: Negative.   Genitourinary: Negative.     Exam:   BP 116/74 (BP Location: Left Arm, Patient Position: Sitting, Cuff Size: Normal)   Pulse 74   Ht '5\' 4"'$  (1.626 m) Comment: reported  Wt 117 lb 6.4 oz (53.3 kg)   LMP 06/12/2007   BMI 20.15 kg/m   Height: '5\' 4"'$  (162.6 cm) (reported)  General appearance: alert, cooperative and appears stated age Breasts: normal appearance, no masses or tenderness Abdomen: soft, non-tender; bowel sounds normal; no masses,  no organomegaly Lymph nodes: Cervical, supraclavicular, and axillary nodes normal.  No abnormal inguinal nodes palpated Neurologic: Grossly normal  Pelvic: External genitalia:  vesicular lesions on perineal body with some erythema              Urethra:  normal appearing urethra with no masses, tenderness  or lesions              Bartholins and Skenes: normal                 Vagina: normal appearing vagina with atrophic changes and no discharge, no lesions              Cervix: no lesions              Pap taken: No. Bimanual Exam:  Uterus:  normal size, contour, position, consistency, mobility, non-tender              Adnexa: normal adnexa and no mass, fullness, tenderness               Rectovaginal: Confirms               Anus:  normal sphincter tone, no lesions  Chaperone, Octaviano Batty, CMA, was present for exam.  Assessment/Plan: 1. GYN exam for high-risk Medicare patient - Pap smear neg with neg HR HPV 2022 - Mammogram 11/2021.  Grade D breast density - Colonoscopy just done.  Release will be signed.   - Bone mineral density 12/2021 - lab work done done with PCP - vaccines reviewed/updated  2. Age-related osteoporosis without current pathological fracture - starting prolia next week - pt knows to stop actonel - Comprehensive metabolic panel  3. Endometrial polyp - h/o hysteroscopy 02/2020  4. Atypical lobular hyperplasia of left breast - s/p lumpectomy.  Has done breat MRIs as well due to increased lifetime risk of breast cancer.  Discussed today.  Pt is going to have MRI done next year.     5. 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  Vulvar lesion - findings discussed.  Concerning for HSV so HSV testing obtained today - rx for valtrex 1 gram bid x 7 days to pharmacy until results are back

## 2022-03-15 LAB — COMPREHENSIVE METABOLIC PANEL
ALT: 17 IU/L (ref 0–32)
AST: 24 IU/L (ref 0–40)
Albumin/Globulin Ratio: 2.6 — ABNORMAL HIGH (ref 1.2–2.2)
Albumin: 5.1 g/dL — ABNORMAL HIGH (ref 3.8–4.8)
Alkaline Phosphatase: 66 IU/L (ref 44–121)
BUN/Creatinine Ratio: 13 (ref 12–28)
BUN: 9 mg/dL (ref 8–27)
Bilirubin Total: 0.4 mg/dL (ref 0.0–1.2)
CO2: 25 mmol/L (ref 20–29)
Calcium: 10 mg/dL (ref 8.7–10.3)
Chloride: 100 mmol/L (ref 96–106)
Creatinine, Ser: 0.69 mg/dL (ref 0.57–1.00)
Globulin, Total: 2 g/dL (ref 1.5–4.5)
Glucose: 100 mg/dL — ABNORMAL HIGH (ref 70–99)
Potassium: 5.1 mmol/L (ref 3.5–5.2)
Sodium: 140 mmol/L (ref 134–144)
Total Protein: 7.1 g/dL (ref 6.0–8.5)
eGFR: 93 mL/min/{1.73_m2} (ref >59)

## 2022-03-19 LAB — CERVICOVAGINAL ANCILLARY ONLY
Comment: NEGATIVE
HSV1: POSITIVE — AB
HSV2: NEGATIVE

## 2022-03-20 ENCOUNTER — Ambulatory Visit (INDEPENDENT_AMBULATORY_CARE_PROVIDER_SITE_OTHER): Payer: Medicare Other

## 2022-03-20 ENCOUNTER — Encounter (HOSPITAL_BASED_OUTPATIENT_CLINIC_OR_DEPARTMENT_OTHER): Payer: Self-pay

## 2022-03-20 ENCOUNTER — Ambulatory Visit (INDEPENDENT_AMBULATORY_CARE_PROVIDER_SITE_OTHER): Payer: Medicare Other | Admitting: *Deleted

## 2022-03-20 ENCOUNTER — Telehealth (HOSPITAL_BASED_OUTPATIENT_CLINIC_OR_DEPARTMENT_OTHER): Payer: Self-pay | Admitting: Obstetrics & Gynecology

## 2022-03-20 VITALS — BP 134/86 | HR 74 | Ht 64.0 in | Wt 118.0 lb

## 2022-03-20 DIAGNOSIS — M810 Age-related osteoporosis without current pathological fracture: Secondary | ICD-10-CM

## 2022-03-20 DIAGNOSIS — B009 Herpesviral infection, unspecified: Secondary | ICD-10-CM

## 2022-03-20 DIAGNOSIS — K631 Perforation of intestine (nontraumatic): Secondary | ICD-10-CM

## 2022-03-20 DIAGNOSIS — N9089 Other specified noninflammatory disorders of vulva and perineum: Secondary | ICD-10-CM

## 2022-03-20 MED ORDER — DENOSUMAB 60 MG/ML ~~LOC~~ SOSY
60.0000 mg | PREFILLED_SYRINGE | Freq: Once | SUBCUTANEOUS | Status: AC
  Administered 2022-03-20: 60 mg via SUBCUTANEOUS

## 2022-03-20 MED ORDER — VALACYCLOVIR HCL 500 MG PO TABS: ORAL_TABLET | ORAL | 0 refills | Status: DC

## 2022-03-20 NOTE — Telephone Encounter (Signed)
Patient is scheduled for 10/20/2022 @ 10 am for her prolia.

## 2022-03-21 ENCOUNTER — Encounter (HOSPITAL_BASED_OUTPATIENT_CLINIC_OR_DEPARTMENT_OTHER): Payer: Self-pay | Admitting: Obstetrics & Gynecology

## 2022-03-21 DIAGNOSIS — B009 Herpesviral infection, unspecified: Secondary | ICD-10-CM | POA: Insufficient documentation

## 2022-03-21 NOTE — Progress Notes (Signed)
GYNECOLOGY  VISIT  CC:   Prolia injection, discuss lab work  HPI: 71 y.o. G0P0000 Married White or Caucasian female here for prolia injection due to diagnosis of osteoporosis.  Had normal calcium level obtained.  At most recent visit, had vulvar lesions c/w HSV that occurred after colonoscopy.  Pt thought was due to colonoscopy but PCR testing obtained and pt started on antiviral therapy.  She was having some malaise and itching/pain around lesions.  This resolved very quickly after starting the anti-viral.  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  Husband does have hx of oral fever blisters and does take valtrex with onset of symptoms as well.  Transmission, treatment, possible recurrence, treatment with recurrence all discussed.  Frequency of this diagnosis and resources for pt discussed.  She is more comfortable with this and treatment plan after discussion..   Past Medical History:  Diagnosis Date   Atypical lobular hyperplasia of left breast    Duodenal perforation ([place]) 01/2014   Endometrial polyp    Osteoporosis    Thyroid nodule 09/2007   right   Vaginal atrophy    Wears glasses     MEDS:   Current Outpatient Medications on File Prior to Visit  Medication Sig Dispense Refill   Calcium Citrate-Vitamin D (CALCIUM + D PO) Take by mouth daily. 2 tabs     cholecalciferol (VITAMIN D) 1000 units tablet Take 1,000 Units by mouth daily.     Lactobacillus (PROBIOTIC ACIDOPHILUS PO) Take by mouth.     loratadine (CLARITIN) 10 MG tablet Take 10 mg by mouth daily.     Multiple Vitamin (MULTIVITAMIN) tablet Take 1 tablet by mouth daily.     omeprazole (PRILOSEC) 40 MG capsule Take 40 mg by mouth every morning.     risedronate (ACTONEL) 35 MG tablet TAKE ONE TABLET BY MOUTH EVERY 7 DAYS WITH WATER ON AN EMPTY STOMACH. NOTHING BY MOUTH AND DO NOT LIE DOWN FOR 30 MINUTES AFTER. 12 tablet 4   vitamin B-12 (CYANOCOBALAMIN) 500 MCG tablet Take 500 mcg by mouth. 3 times a week     No current  facility-administered medications on file prior to visit.    ALLERGIES: Ativan [lorazepam], Morphine and related, Penicillins, Amoxicillin, Erythromycin, and Other  SH:  married, non smoker  Review of Systems  Constitutional: Negative.     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  Rx given. - valACYclovir (VALTREX) 500 MG tablet; With onset of symptoms, take 1 tablet bid x 3 days  Dispense: 30 tablet; Refill: 0  2. Age-related osteoporosis without current pathological fracture - Prolia given today.  Repeat 6 months.

## 2022-03-22 DIAGNOSIS — Z23 Encounter for immunization: Secondary | ICD-10-CM | POA: Diagnosis not present

## 2022-04-19 DIAGNOSIS — K219 Gastro-esophageal reflux disease without esophagitis: Secondary | ICD-10-CM | POA: Diagnosis not present

## 2022-04-19 DIAGNOSIS — H25812 Combined forms of age-related cataract, left eye: Secondary | ICD-10-CM | POA: Diagnosis not present

## 2022-04-27 DIAGNOSIS — H2511 Age-related nuclear cataract, right eye: Secondary | ICD-10-CM | POA: Diagnosis not present

## 2022-04-27 DIAGNOSIS — Z885 Allergy status to narcotic agent status: Secondary | ICD-10-CM | POA: Diagnosis not present

## 2022-04-27 DIAGNOSIS — Z880 Allergy status to penicillin: Secondary | ICD-10-CM | POA: Diagnosis not present

## 2022-04-27 DIAGNOSIS — Z961 Presence of intraocular lens: Secondary | ICD-10-CM | POA: Diagnosis not present

## 2022-04-27 DIAGNOSIS — Q120 Congenital cataract: Secondary | ICD-10-CM | POA: Diagnosis not present

## 2022-04-27 DIAGNOSIS — Z888 Allergy status to other drugs, medicaments and biological substances status: Secondary | ICD-10-CM | POA: Diagnosis not present

## 2022-04-27 DIAGNOSIS — Z881 Allergy status to other antibiotic agents status: Secondary | ICD-10-CM | POA: Diagnosis not present

## 2022-04-27 DIAGNOSIS — Z48810 Encounter for surgical aftercare following surgery on the sense organs: Secondary | ICD-10-CM | POA: Diagnosis not present

## 2022-05-08 DIAGNOSIS — H52201 Unspecified astigmatism, right eye: Secondary | ICD-10-CM | POA: Diagnosis not present

## 2022-05-08 DIAGNOSIS — H25811 Combined forms of age-related cataract, right eye: Secondary | ICD-10-CM | POA: Diagnosis not present

## 2022-05-09 DIAGNOSIS — Z48810 Encounter for surgical aftercare following surgery on the sense organs: Secondary | ICD-10-CM | POA: Diagnosis not present

## 2022-06-18 DIAGNOSIS — L821 Other seborrheic keratosis: Secondary | ICD-10-CM | POA: Diagnosis not present

## 2022-06-18 DIAGNOSIS — L853 Xerosis cutis: Secondary | ICD-10-CM | POA: Diagnosis not present

## 2022-06-18 DIAGNOSIS — D225 Melanocytic nevi of trunk: Secondary | ICD-10-CM | POA: Diagnosis not present

## 2022-06-18 DIAGNOSIS — D2361 Other benign neoplasm of skin of right upper limb, including shoulder: Secondary | ICD-10-CM | POA: Diagnosis not present

## 2022-06-18 DIAGNOSIS — D2271 Melanocytic nevi of right lower limb, including hip: Secondary | ICD-10-CM | POA: Diagnosis not present

## 2022-08-29 NOTE — Progress Notes (Signed)
Late entry - Pt had visit with Dr Sabra Heck on 03/14/22.  Here for Prolia injection today.

## 2022-09-19 ENCOUNTER — Other Ambulatory Visit (HOSPITAL_BASED_OUTPATIENT_CLINIC_OR_DEPARTMENT_OTHER): Payer: Self-pay | Admitting: *Deleted

## 2022-09-19 DIAGNOSIS — M810 Age-related osteoporosis without current pathological fracture: Secondary | ICD-10-CM

## 2022-09-19 MED ORDER — DENOSUMAB 60 MG/ML ~~LOC~~ SOSY
60.0000 mg | PREFILLED_SYRINGE | Freq: Once | SUBCUTANEOUS | Status: AC
  Administered 2022-09-20: 60 mg via SUBCUTANEOUS

## 2022-09-19 NOTE — Progress Notes (Signed)
Order placed for Prolia

## 2022-09-20 ENCOUNTER — Ambulatory Visit (INDEPENDENT_AMBULATORY_CARE_PROVIDER_SITE_OTHER): Payer: Medicare Other

## 2022-09-20 DIAGNOSIS — M810 Age-related osteoporosis without current pathological fracture: Secondary | ICD-10-CM

## 2022-09-20 MED ORDER — DENOSUMAB 60 MG/ML ~~LOC~~ SOSY: 60.0000 mg | PREFILLED_SYRINGE | Freq: Once | SUBCUTANEOUS | Status: AC

## 2022-09-20 NOTE — Progress Notes (Signed)
Patient came in today to receive 6 month Prolia injection. Patient was given Prolia 60mg  subcutaneously in the back of her left upper arm. Patient tolerated injection well. tbw

## 2022-12-20 DIAGNOSIS — K219 Gastro-esophageal reflux disease without esophagitis: Secondary | ICD-10-CM | POA: Diagnosis not present

## 2022-12-20 DIAGNOSIS — R413 Other amnesia: Secondary | ICD-10-CM | POA: Diagnosis not present

## 2022-12-20 DIAGNOSIS — M810 Age-related osteoporosis without current pathological fracture: Secondary | ICD-10-CM | POA: Diagnosis not present

## 2022-12-20 DIAGNOSIS — Z0000 Encounter for general adult medical examination without abnormal findings: Secondary | ICD-10-CM | POA: Diagnosis not present

## 2022-12-20 DIAGNOSIS — Z1331 Encounter for screening for depression: Secondary | ICD-10-CM | POA: Diagnosis not present

## 2022-12-20 DIAGNOSIS — Z23 Encounter for immunization: Secondary | ICD-10-CM | POA: Diagnosis not present

## 2022-12-20 DIAGNOSIS — Z1231 Encounter for screening mammogram for malignant neoplasm of breast: Secondary | ICD-10-CM | POA: Diagnosis not present

## 2022-12-20 DIAGNOSIS — Z8719 Personal history of other diseases of the digestive system: Secondary | ICD-10-CM | POA: Diagnosis not present

## 2022-12-20 DIAGNOSIS — Z79899 Other long term (current) drug therapy: Secondary | ICD-10-CM | POA: Diagnosis not present

## 2022-12-20 DIAGNOSIS — Z6820 Body mass index (BMI) 20.0-20.9, adult: Secondary | ICD-10-CM | POA: Diagnosis not present

## 2023-01-02 ENCOUNTER — Encounter (HOSPITAL_BASED_OUTPATIENT_CLINIC_OR_DEPARTMENT_OTHER): Payer: Self-pay | Admitting: *Deleted

## 2023-01-30 DIAGNOSIS — Z9842 Cataract extraction status, left eye: Secondary | ICD-10-CM | POA: Diagnosis not present

## 2023-01-30 DIAGNOSIS — Z961 Presence of intraocular lens: Secondary | ICD-10-CM | POA: Diagnosis not present

## 2023-01-30 DIAGNOSIS — H43391 Other vitreous opacities, right eye: Secondary | ICD-10-CM | POA: Diagnosis not present

## 2023-01-30 DIAGNOSIS — Z9841 Cataract extraction status, right eye: Secondary | ICD-10-CM | POA: Diagnosis not present

## 2023-02-26 DIAGNOSIS — K5730 Diverticulosis of large intestine without perforation or abscess without bleeding: Secondary | ICD-10-CM | POA: Diagnosis not present

## 2023-02-26 DIAGNOSIS — K219 Gastro-esophageal reflux disease without esophagitis: Secondary | ICD-10-CM | POA: Diagnosis not present

## 2023-02-28 ENCOUNTER — Other Ambulatory Visit (HOSPITAL_BASED_OUTPATIENT_CLINIC_OR_DEPARTMENT_OTHER): Payer: Self-pay

## 2023-02-28 ENCOUNTER — Other Ambulatory Visit (HOSPITAL_BASED_OUTPATIENT_CLINIC_OR_DEPARTMENT_OTHER): Payer: Medicare Other

## 2023-02-28 DIAGNOSIS — M810 Age-related osteoporosis without current pathological fracture: Secondary | ICD-10-CM | POA: Diagnosis not present

## 2023-03-01 LAB — COMPREHENSIVE METABOLIC PANEL
ALT: 17 IU/L (ref 0–32)
AST: 21 IU/L (ref 0–40)
Albumin: 4.7 g/dL (ref 3.8–4.8)
Alkaline Phosphatase: 47 IU/L (ref 44–121)
BUN/Creatinine Ratio: 16 (ref 12–28)
BUN: 11 mg/dL (ref 8–27)
Bilirubin Total: 0.5 mg/dL (ref 0.0–1.2)
CO2: 23 mmol/L (ref 20–29)
Calcium: 9.4 mg/dL (ref 8.7–10.3)
Chloride: 100 mmol/L (ref 96–106)
Creatinine, Ser: 0.67 mg/dL (ref 0.57–1.00)
Globulin, Total: 2 g/dL (ref 1.5–4.5)
Glucose: 142 mg/dL — ABNORMAL HIGH (ref 70–99)
Potassium: 4.1 mmol/L (ref 3.5–5.2)
Sodium: 139 mmol/L (ref 134–144)
Total Protein: 6.7 g/dL (ref 6.0–8.5)
eGFR: 93 mL/min/{1.73_m2} (ref >59)

## 2023-03-25 ENCOUNTER — Encounter (HOSPITAL_BASED_OUTPATIENT_CLINIC_OR_DEPARTMENT_OTHER): Payer: Self-pay

## 2023-03-25 ENCOUNTER — Ambulatory Visit (INDEPENDENT_AMBULATORY_CARE_PROVIDER_SITE_OTHER): Payer: Medicare Other

## 2023-03-25 VITALS — BP 120/80 | HR 63 | Ht 64.0 in | Wt 121.8 lb

## 2023-03-25 DIAGNOSIS — M810 Age-related osteoporosis without current pathological fracture: Secondary | ICD-10-CM | POA: Diagnosis not present

## 2023-03-25 MED ORDER — DENOSUMAB 60 MG/ML ~~LOC~~ SOSY
60.0000 mg | PREFILLED_SYRINGE | Freq: Once | SUBCUTANEOUS | Status: AC
  Administered 2023-03-25: 60 mg via SUBCUTANEOUS

## 2023-03-25 NOTE — Progress Notes (Signed)
Patient came in today to receive Prolia injection. Injection was given to patient in the back of her left upper arm. Patient tolerated injection well. tbw

## 2023-03-28 DIAGNOSIS — Z23 Encounter for immunization: Secondary | ICD-10-CM | POA: Diagnosis not present

## 2023-04-03 DIAGNOSIS — Z23 Encounter for immunization: Secondary | ICD-10-CM | POA: Diagnosis not present

## 2023-08-22 ENCOUNTER — Other Ambulatory Visit (HOSPITAL_BASED_OUTPATIENT_CLINIC_OR_DEPARTMENT_OTHER): Payer: Self-pay | Admitting: *Deleted

## 2023-08-22 DIAGNOSIS — M810 Age-related osteoporosis without current pathological fracture: Secondary | ICD-10-CM

## 2023-08-22 MED ORDER — DENOSUMAB 60 MG/ML ~~LOC~~ SOSY
60.0000 mg | PREFILLED_SYRINGE | SUBCUTANEOUS | Status: AC
  Administered 2023-09-24: 60 mg via SUBCUTANEOUS

## 2023-09-03 DIAGNOSIS — D2262 Melanocytic nevi of left upper limb, including shoulder: Secondary | ICD-10-CM | POA: Diagnosis not present

## 2023-09-03 DIAGNOSIS — D2361 Other benign neoplasm of skin of right upper limb, including shoulder: Secondary | ICD-10-CM | POA: Diagnosis not present

## 2023-09-03 DIAGNOSIS — L858 Other specified epidermal thickening: Secondary | ICD-10-CM | POA: Diagnosis not present

## 2023-09-03 DIAGNOSIS — L853 Xerosis cutis: Secondary | ICD-10-CM | POA: Diagnosis not present

## 2023-09-03 DIAGNOSIS — D2271 Melanocytic nevi of right lower limb, including hip: Secondary | ICD-10-CM | POA: Diagnosis not present

## 2023-09-03 DIAGNOSIS — D225 Melanocytic nevi of trunk: Secondary | ICD-10-CM | POA: Diagnosis not present

## 2023-09-03 DIAGNOSIS — D2272 Melanocytic nevi of left lower limb, including hip: Secondary | ICD-10-CM | POA: Diagnosis not present

## 2023-09-03 DIAGNOSIS — L821 Other seborrheic keratosis: Secondary | ICD-10-CM | POA: Diagnosis not present

## 2023-09-23 ENCOUNTER — Ambulatory Visit (HOSPITAL_BASED_OUTPATIENT_CLINIC_OR_DEPARTMENT_OTHER): Payer: Medicare Other

## 2023-09-24 ENCOUNTER — Ambulatory Visit (INDEPENDENT_AMBULATORY_CARE_PROVIDER_SITE_OTHER): Admitting: *Deleted

## 2023-09-24 VITALS — BP 122/74 | HR 70 | Ht 64.0 in | Wt 120.0 lb

## 2023-09-24 DIAGNOSIS — M810 Age-related osteoporosis without current pathological fracture: Secondary | ICD-10-CM

## 2023-09-24 NOTE — Progress Notes (Signed)
 NURSE VISIT- INJECTION  SUBJECTIVE:  Brianna Fuller is a 73 y.o. G0P0000 female here for a Prolia injection for osteoporosis. She is a GYN patient.   OBJECTIVE:  BP 122/74 (Cuff Size: Normal)   Pulse 70   Ht 5\' 4"  (1.626 m)   Wt 120 lb (54.4 kg)   LMP 06/12/2007   BMI 20.60 kg/m   Appears well, in no apparent distress  Injection administered in: Left arm    ASSESSMENT: GYN patient Prolia for treatment of osteoporosis PLAN: Follow-up: as scheduled    Maudine Sos, RN

## 2023-11-11 DIAGNOSIS — Z961 Presence of intraocular lens: Secondary | ICD-10-CM | POA: Diagnosis not present

## 2023-11-11 DIAGNOSIS — H524 Presbyopia: Secondary | ICD-10-CM | POA: Diagnosis not present

## 2023-12-26 DIAGNOSIS — M810 Age-related osteoporosis without current pathological fracture: Secondary | ICD-10-CM | POA: Diagnosis not present

## 2023-12-26 DIAGNOSIS — Z0000 Encounter for general adult medical examination without abnormal findings: Secondary | ICD-10-CM | POA: Diagnosis not present

## 2023-12-26 DIAGNOSIS — K219 Gastro-esophageal reflux disease without esophagitis: Secondary | ICD-10-CM | POA: Diagnosis not present

## 2023-12-26 DIAGNOSIS — Z1231 Encounter for screening mammogram for malignant neoplasm of breast: Secondary | ICD-10-CM | POA: Diagnosis not present

## 2023-12-26 DIAGNOSIS — Z6820 Body mass index (BMI) 20.0-20.9, adult: Secondary | ICD-10-CM | POA: Diagnosis not present

## 2023-12-26 DIAGNOSIS — K265 Chronic or unspecified duodenal ulcer with perforation: Secondary | ICD-10-CM | POA: Diagnosis not present

## 2023-12-26 DIAGNOSIS — M5410 Radiculopathy, site unspecified: Secondary | ICD-10-CM | POA: Diagnosis not present

## 2023-12-26 DIAGNOSIS — Z79899 Other long term (current) drug therapy: Secondary | ICD-10-CM | POA: Diagnosis not present

## 2023-12-26 DIAGNOSIS — Z7189 Other specified counseling: Secondary | ICD-10-CM | POA: Diagnosis not present

## 2023-12-26 DIAGNOSIS — Z136 Encounter for screening for cardiovascular disorders: Secondary | ICD-10-CM | POA: Diagnosis not present

## 2024-02-24 ENCOUNTER — Other Ambulatory Visit (HOSPITAL_BASED_OUTPATIENT_CLINIC_OR_DEPARTMENT_OTHER): Payer: Self-pay | Admitting: *Deleted

## 2024-02-24 DIAGNOSIS — M810 Age-related osteoporosis without current pathological fracture: Secondary | ICD-10-CM

## 2024-02-24 MED ORDER — DENOSUMAB 60 MG/ML ~~LOC~~ SOSY
60.0000 mg | PREFILLED_SYRINGE | SUBCUTANEOUS | Status: AC
  Administered 2024-03-26: 60 mg via SUBCUTANEOUS

## 2024-02-24 NOTE — Progress Notes (Signed)
 Prolia  order dropped for next Prolia  due 03/26/24.  No Auth needed KD

## 2024-02-27 DIAGNOSIS — K219 Gastro-esophageal reflux disease without esophagitis: Secondary | ICD-10-CM | POA: Diagnosis not present

## 2024-02-27 DIAGNOSIS — K5730 Diverticulosis of large intestine without perforation or abscess without bleeding: Secondary | ICD-10-CM | POA: Diagnosis not present

## 2024-03-04 DIAGNOSIS — Z23 Encounter for immunization: Secondary | ICD-10-CM | POA: Diagnosis not present

## 2024-03-19 ENCOUNTER — Ambulatory Visit (HOSPITAL_BASED_OUTPATIENT_CLINIC_OR_DEPARTMENT_OTHER): Payer: Medicare Other | Admitting: Obstetrics & Gynecology

## 2024-03-19 ENCOUNTER — Encounter (HOSPITAL_BASED_OUTPATIENT_CLINIC_OR_DEPARTMENT_OTHER): Payer: Self-pay | Admitting: Obstetrics & Gynecology

## 2024-03-26 ENCOUNTER — Ambulatory Visit (HOSPITAL_BASED_OUTPATIENT_CLINIC_OR_DEPARTMENT_OTHER)

## 2024-03-26 ENCOUNTER — Encounter (HOSPITAL_BASED_OUTPATIENT_CLINIC_OR_DEPARTMENT_OTHER): Payer: Self-pay

## 2024-03-26 DIAGNOSIS — M810 Age-related osteoporosis without current pathological fracture: Secondary | ICD-10-CM | POA: Diagnosis not present

## 2024-03-26 NOTE — Progress Notes (Signed)
 NURSE VISIT- INJECTION  SUBJECTIVE:  Brianna Fuller is a 73 y.o. G0P0000 female here for a Prolia  Injection for per provider order. She is a GYN patient.   OBJECTIVE:  LMP 06/12/2007   Appears well, in no apparent distress  Injection administered in: Left arm  No orders of the defined types were placed in this encounter.  ASSESSMENT: GYN patient Prolia  Injection for per provider order  PLAN: Follow-up: as needed   Morna LOISE Quale, RN

## 2024-04-28 ENCOUNTER — Other Ambulatory Visit: Payer: Self-pay | Admitting: *Deleted

## 2024-04-28 DIAGNOSIS — M810 Age-related osteoporosis without current pathological fracture: Secondary | ICD-10-CM

## 2024-04-28 MED ORDER — DENOSUMAB 60 MG/ML ~~LOC~~ SOSY: 60.0000 mg | PREFILLED_SYRINGE | SUBCUTANEOUS | Status: AC

## 2024-04-28 NOTE — Progress Notes (Signed)
 Order needed for next Prolia  due after 09/24/24. Order placed KD CMA

## 2024-06-23 ENCOUNTER — Ambulatory Visit (HOSPITAL_BASED_OUTPATIENT_CLINIC_OR_DEPARTMENT_OTHER): Admitting: Obstetrics & Gynecology

## 2024-07-16 ENCOUNTER — Ambulatory Visit (HOSPITAL_BASED_OUTPATIENT_CLINIC_OR_DEPARTMENT_OTHER): Admitting: Obstetrics & Gynecology

## 2024-07-16 ENCOUNTER — Encounter (HOSPITAL_BASED_OUTPATIENT_CLINIC_OR_DEPARTMENT_OTHER): Payer: Self-pay | Admitting: Obstetrics & Gynecology

## 2024-07-16 VITALS — BP 126/82 | HR 71 | Wt 117.0 lb

## 2024-07-16 DIAGNOSIS — M810 Age-related osteoporosis without current pathological fracture: Secondary | ICD-10-CM

## 2024-07-16 DIAGNOSIS — B977 Papillomavirus as the cause of diseases classified elsewhere: Secondary | ICD-10-CM

## 2024-07-16 DIAGNOSIS — Z01419 Encounter for gynecological examination (general) (routine) without abnormal findings: Secondary | ICD-10-CM

## 2024-07-16 DIAGNOSIS — N870 Mild cervical dysplasia: Secondary | ICD-10-CM

## 2024-07-16 DIAGNOSIS — Z9889 Other specified postprocedural states: Secondary | ICD-10-CM

## 2024-07-16 NOTE — Progress Notes (Unsigned)
 "  Breast and pelvic exam Patient name: Brianna Fuller MRN [id number]  Date of birth: 1950/06/28 Chief Complaint:   Gynecologic Exam  History of Present Illness:   Brianna Fuller is a 74 y.o. G0P0000 Caucasian female being seen today for breast and pelvic exam.  Denies vaginal bleeding.   Current complaints: Patient would like to discuss if it is time for bone density test.   Patient's last menstrual period was 06/12/2007.   Last pap 02/16/2021. Results were: NILM w/ HRHPV negative. H/O abnormal pap: yes Last mammogram: 12/26/2023. Results were: normal. Family h/o breast cancer: yes paternal aunt Last colonoscopy: 03/07/2022.  No additional routine scheduling recommended.       07/16/2024    1:09 PM 03/25/2023    4:20 PM 03/20/2022    5:10 PM 03/14/2022   10:23 AM 02/16/2021    1:48 PM  Depression screen PHQ 2/9  Decreased Interest 0 0 0 0 0  Down, Depressed, Hopeless 0 0 0 0 0  PHQ - 2 Score 0 0 0 0 0     Review of Systems:   Pertinent items are noted in HPI Denies any urinary or bowel changes.  Denies pelvic pain.  Pertinent History Reviewed:  Reviewed past medical,surgical, social and family history.  Reviewed problem list, medications and allergies. Physical Assessment:   Vitals:   07/16/24 1308  BP: 126/82  Pulse: 71  SpO2: 100%  Weight: 117 lb (53.1 kg)  Body mass index is 20.08 kg/m.        Physical Examination:   General appearance - well appearing, and in no distress  Mental status - alert, oriented to person, place, and time  Psych:  She has a normal mood and affect  Skin - warm and dry, normal color, no suspicious lesions noted  Chest - effort normal, all lung fields clear to auscultation bilaterally  Heart - normal rate and regular rhythm  Neck:  midline trachea, no thyromegaly or nodules  Breasts - breasts appear normal, no suspicious masses, no skin or nipple changes or  axillary nodes  Abdomen - soft, nontender, nondistended, no masses or  organomegaly  Pelvic - VULVA: normal appearing vulva with no masses, tenderness or lesions   VAGINA: normal appearing vagina with normal color and discharge, no lesions   CERVIX: normal appearing cervix without discharge or lesions, no CMT  Thin prep pap is obtained with HR HPV cotesting  UTERUS: uterus is felt to be normal size, shape, consistency and nontender   ADNEXA: No adnexal masses or tenderness noted.  Rectal - normal rectal, good sphincter tone, no masses felt  Extremities:  No swelling or varicosities noted  Chaperone present for exam  No results found for this or any previous visit (from the past 24 hours).  Assessment & Plan:  1. Encntr for gyn exam (general) (routine) w/o abn findings (Primary) - Pap smear  with HR HPV obtained today - Mammogram 12/2023 - Colonoscopy 2023.  No routine follow up recommended. - Bone mineral density ordered to update this year - lab work done with PCP, Dr. Aisha - vaccines reviewed/updated  2. Age-related osteoporosis without current pathological fracture - on Vit D 1000IU and calcium - DG Bone Density; Future  3. H/O LEEP  4. Dysplasia of cervix, low grade (CIN 1)  5. High risk HPV infection  6.  Lobular hyperplasia ***   Orders Placed This Encounter  Procedures   DG Bone Density    Meds: No orders of the  defined types were placed in this encounter.   Follow-up: No follow-ups on file.  Ronal GORMAN Pinal, MD 07/16/2024 1:48 PM "
